# Patient Record
Sex: Female | Born: 1950
Health system: Southern US, Community
[De-identification: ages and names within clinical notes are randomized; demographics above are authoritative.]

## PROBLEM LIST (undated history)

## (undated) DIAGNOSIS — G4730 Sleep apnea, unspecified: Secondary | ICD-10-CM

## (undated) DIAGNOSIS — I10 Essential (primary) hypertension: Secondary | ICD-10-CM

## (undated) DIAGNOSIS — I2510 Atherosclerotic heart disease of native coronary artery without angina pectoris: Secondary | ICD-10-CM

## (undated) DIAGNOSIS — C4491 Basal cell carcinoma of skin, unspecified: Secondary | ICD-10-CM

## (undated) DIAGNOSIS — C4492 Squamous cell carcinoma of skin, unspecified: Secondary | ICD-10-CM

## (undated) DIAGNOSIS — N200 Calculus of kidney: Secondary | ICD-10-CM

## (undated) DIAGNOSIS — J189 Pneumonia, unspecified organism: Secondary | ICD-10-CM

## (undated) DIAGNOSIS — F419 Anxiety disorder, unspecified: Secondary | ICD-10-CM

## (undated) DIAGNOSIS — M1990 Unspecified osteoarthritis, unspecified site: Secondary | ICD-10-CM

## (undated) DIAGNOSIS — I519 Heart disease, unspecified: Secondary | ICD-10-CM

## (undated) DIAGNOSIS — E7800 Pure hypercholesterolemia, unspecified: Secondary | ICD-10-CM

## (undated) DIAGNOSIS — Z9889 Other specified postprocedural states: Secondary | ICD-10-CM

## (undated) DIAGNOSIS — E039 Hypothyroidism, unspecified: Secondary | ICD-10-CM

## (undated) DIAGNOSIS — K219 Gastro-esophageal reflux disease without esophagitis: Secondary | ICD-10-CM

## (undated) DIAGNOSIS — R112 Nausea with vomiting, unspecified: Secondary | ICD-10-CM

## (undated) HISTORY — PX: EYE SURGERY: SHX253

## (undated) HISTORY — PX: THYROIDECTOMY, PARTIAL: SHX18

## (undated) HISTORY — PX: JOINT REPLACEMENT: SHX530

## (undated) HISTORY — PX: CATARACT EXTRACTION: SUR2

## (undated) HISTORY — DX: Heart disease, unspecified: I51.9

## (undated) HISTORY — DX: Basal cell carcinoma of skin, unspecified: C44.91

## (undated) HISTORY — DX: Squamous cell carcinoma of skin, unspecified: C44.92

## (undated) HISTORY — DX: Unspecified osteoarthritis, unspecified site: M19.90

---

## 1978-01-27 HISTORY — PX: TUBAL LIGATION: SHX77

## 1978-01-27 HISTORY — PX: UMBILICAL HERNIA REPAIR: SHX196

## 1991-01-28 HISTORY — PX: OTHER SURGICAL HISTORY: SHX169

## 1997-03-07 ENCOUNTER — Ambulatory Visit (HOSPITAL_COMMUNITY): Admission: RE | Admit: 1997-03-07 | Discharge: 1997-03-07 | Payer: Self-pay | Admitting: *Deleted

## 1998-04-16 ENCOUNTER — Other Ambulatory Visit: Admission: RE | Admit: 1998-04-16 | Discharge: 1998-04-16 | Payer: Self-pay | Admitting: Obstetrics and Gynecology

## 1998-05-15 ENCOUNTER — Ambulatory Visit (HOSPITAL_COMMUNITY): Admission: RE | Admit: 1998-05-15 | Discharge: 1998-05-15 | Payer: Self-pay | Admitting: Obstetrics and Gynecology

## 1998-05-15 ENCOUNTER — Encounter: Payer: Self-pay | Admitting: Obstetrics and Gynecology

## 1999-05-28 ENCOUNTER — Encounter: Payer: Self-pay | Admitting: Obstetrics and Gynecology

## 1999-05-28 ENCOUNTER — Ambulatory Visit (HOSPITAL_COMMUNITY): Admission: RE | Admit: 1999-05-28 | Discharge: 1999-05-28 | Payer: Self-pay | Admitting: Obstetrics and Gynecology

## 1999-09-26 ENCOUNTER — Other Ambulatory Visit: Admission: RE | Admit: 1999-09-26 | Discharge: 1999-09-26 | Payer: Self-pay | Admitting: Obstetrics and Gynecology

## 2000-08-04 ENCOUNTER — Ambulatory Visit (HOSPITAL_COMMUNITY): Admission: RE | Admit: 2000-08-04 | Discharge: 2000-08-04 | Payer: Self-pay | Admitting: Obstetrics and Gynecology

## 2000-08-04 ENCOUNTER — Encounter: Payer: Self-pay | Admitting: Obstetrics and Gynecology

## 2000-10-22 ENCOUNTER — Other Ambulatory Visit: Admission: RE | Admit: 2000-10-22 | Discharge: 2000-10-22 | Payer: Self-pay | Admitting: Obstetrics and Gynecology

## 2001-10-21 ENCOUNTER — Other Ambulatory Visit: Admission: RE | Admit: 2001-10-21 | Discharge: 2001-10-21 | Payer: Self-pay | Admitting: Obstetrics and Gynecology

## 2001-11-27 HISTORY — PX: HYSTEROSCOPY: SHX211

## 2001-12-14 ENCOUNTER — Ambulatory Visit (HOSPITAL_COMMUNITY): Admission: RE | Admit: 2001-12-14 | Discharge: 2001-12-14 | Payer: Self-pay | Admitting: Obstetrics and Gynecology

## 2001-12-14 ENCOUNTER — Encounter (INDEPENDENT_AMBULATORY_CARE_PROVIDER_SITE_OTHER): Payer: Self-pay | Admitting: *Deleted

## 2001-12-28 ENCOUNTER — Encounter: Payer: Self-pay | Admitting: Obstetrics and Gynecology

## 2001-12-28 ENCOUNTER — Ambulatory Visit (HOSPITAL_COMMUNITY): Admission: RE | Admit: 2001-12-28 | Discharge: 2001-12-28 | Payer: Self-pay | Admitting: Obstetrics and Gynecology

## 2002-10-24 ENCOUNTER — Other Ambulatory Visit: Admission: RE | Admit: 2002-10-24 | Discharge: 2002-10-24 | Payer: Self-pay | Admitting: Obstetrics and Gynecology

## 2002-10-28 HISTORY — PX: THYROID CYST EXCISION: SHX2511

## 2003-01-13 ENCOUNTER — Ambulatory Visit (HOSPITAL_COMMUNITY): Admission: RE | Admit: 2003-01-13 | Discharge: 2003-01-13 | Payer: Self-pay | Admitting: Obstetrics and Gynecology

## 2003-01-28 DIAGNOSIS — I2510 Atherosclerotic heart disease of native coronary artery without angina pectoris: Secondary | ICD-10-CM

## 2003-01-28 HISTORY — DX: Atherosclerotic heart disease of native coronary artery without angina pectoris: I25.10

## 2003-01-28 HISTORY — PX: ANGIOPLASTY: SHX39

## 2003-01-28 HISTORY — PX: CORONARY ANGIOPLASTY: SHX604

## 2003-07-12 ENCOUNTER — Other Ambulatory Visit: Payer: Self-pay

## 2003-11-27 ENCOUNTER — Other Ambulatory Visit: Admission: RE | Admit: 2003-11-27 | Discharge: 2003-11-27 | Payer: Self-pay | Admitting: Obstetrics and Gynecology

## 2003-12-29 ENCOUNTER — Ambulatory Visit (HOSPITAL_COMMUNITY): Admission: RE | Admit: 2003-12-29 | Discharge: 2003-12-30 | Payer: Self-pay | Admitting: Cardiovascular Disease

## 2004-01-15 ENCOUNTER — Ambulatory Visit (HOSPITAL_COMMUNITY): Admission: RE | Admit: 2004-01-15 | Discharge: 2004-01-15 | Payer: Self-pay | Admitting: Obstetrics and Gynecology

## 2004-01-16 ENCOUNTER — Encounter: Payer: Self-pay | Admitting: Cardiovascular Disease

## 2004-01-28 ENCOUNTER — Encounter: Payer: Self-pay | Admitting: Cardiovascular Disease

## 2004-02-28 ENCOUNTER — Encounter: Payer: Self-pay | Admitting: Cardiovascular Disease

## 2004-03-27 ENCOUNTER — Encounter: Payer: Self-pay | Admitting: Cardiovascular Disease

## 2004-04-27 ENCOUNTER — Encounter: Payer: Self-pay | Admitting: Cardiovascular Disease

## 2004-07-29 ENCOUNTER — Encounter: Payer: Self-pay | Admitting: Cardiovascular Disease

## 2004-12-12 ENCOUNTER — Other Ambulatory Visit: Admission: RE | Admit: 2004-12-12 | Discharge: 2004-12-12 | Payer: Self-pay | Admitting: Obstetrics and Gynecology

## 2005-01-13 ENCOUNTER — Ambulatory Visit (HOSPITAL_COMMUNITY): Admission: RE | Admit: 2005-01-13 | Discharge: 2005-01-13 | Payer: Self-pay | Admitting: Gastroenterology

## 2005-01-15 ENCOUNTER — Ambulatory Visit (HOSPITAL_COMMUNITY): Admission: RE | Admit: 2005-01-15 | Discharge: 2005-01-15 | Payer: Self-pay | Admitting: Obstetrics and Gynecology

## 2005-12-17 ENCOUNTER — Other Ambulatory Visit: Admission: RE | Admit: 2005-12-17 | Discharge: 2005-12-17 | Payer: Self-pay | Admitting: Obstetrics & Gynecology

## 2006-01-01 DIAGNOSIS — I2510 Atherosclerotic heart disease of native coronary artery without angina pectoris: Secondary | ICD-10-CM | POA: Insufficient documentation

## 2006-01-01 DIAGNOSIS — E039 Hypothyroidism, unspecified: Secondary | ICD-10-CM | POA: Insufficient documentation

## 2006-01-16 ENCOUNTER — Ambulatory Visit (HOSPITAL_COMMUNITY): Admission: RE | Admit: 2006-01-16 | Discharge: 2006-01-16 | Payer: Self-pay | Admitting: Obstetrics & Gynecology

## 2006-01-22 ENCOUNTER — Ambulatory Visit (HOSPITAL_COMMUNITY): Admission: RE | Admit: 2006-01-22 | Discharge: 2006-01-22 | Payer: Self-pay | Admitting: Obstetrics & Gynecology

## 2007-06-11 ENCOUNTER — Other Ambulatory Visit: Admission: RE | Admit: 2007-06-11 | Discharge: 2007-06-11 | Payer: Self-pay | Admitting: Obstetrics and Gynecology

## 2007-06-11 ENCOUNTER — Ambulatory Visit (HOSPITAL_COMMUNITY): Admission: RE | Admit: 2007-06-11 | Discharge: 2007-06-11 | Payer: Self-pay | Admitting: Obstetrics & Gynecology

## 2008-02-28 HISTORY — PX: CARDIAC CATHETERIZATION: SHX172

## 2008-03-24 ENCOUNTER — Ambulatory Visit: Payer: Self-pay | Admitting: Cardiovascular Disease

## 2008-10-13 ENCOUNTER — Ambulatory Visit (HOSPITAL_COMMUNITY): Admission: RE | Admit: 2008-10-13 | Discharge: 2008-10-13 | Payer: Self-pay | Admitting: Obstetrics & Gynecology

## 2009-12-14 ENCOUNTER — Ambulatory Visit (HOSPITAL_COMMUNITY): Admission: RE | Admit: 2009-12-14 | Discharge: 2009-12-14 | Payer: Self-pay | Admitting: Obstetrics & Gynecology

## 2010-02-17 ENCOUNTER — Encounter: Payer: Self-pay | Admitting: Obstetrics & Gynecology

## 2010-06-14 NOTE — Op Note (Signed)
NAME:  Olivia Dougherty, Olivia Dougherty                           ACCOUNT NO.:  [id number]   MEDICAL RECORD NO.:  [id number]                   PATIENT TYPE:  AMB   LOCATION:  SDC                                  FACILITY:  [facility]   PHYSICIAN:  Laqueta Linden, M.D.                 DATE OF BIRTH:  October 11, 1950   DATE OF PROCEDURE:  12/14/2001  DATE OF DISCHARGE:                                 OPERATIVE REPORT   PREOPERATIVE DIAGNOSIS:  Dysfunctional uterine bleeding due to submucosal  fibroids.   POSTOPERATIVE DIAGNOSIS:  Dysfunctional uterine bleeding due to submucosal  fibroids.   PROCEDURE:  Hysteroscopic resection with curettage.   SURGEON:  Laqueta Linden, M.D.   ANESTHESIA:  General LMA with paracervical block.   ESTIMATED BLOOD LOSS:  Less than 20 cc.   FLUIDS:  Sorbitol:  Less than 10 cc.   COMPLICATIONS:  None.   INDICATIONS:  The patient is a 60 year old perimenopausal female with  worsening dysmenorrhea, menorrhagia, and prolonged bleeding.  She underwent  pelvic ultrasound with sonohysterogram, which revealed a slightly enlarged  uterus with a thickened endometrial stripe and an intramural fibroid.  Sonohysterogram revealed an echogenic focus on the posterior wall measuring  7 x 5 mm consistent with a submucosal fibroid.  It was felt that the  patient's age was contributing to her heavy flow, and she was started on low-  dose birth control pills.  This did regulate her cycles somewhat, but they  continued to be profusely heavy with clots felt to be related to the  submucosal fibroids.  For this reason, she is to undergo hysteroscopic  resection.  She has seen the informed consent film, voiced her understanding  and acceptance of risks, benefits, alternatives, and complications,  including the need for ongoing birth control pills due to her perimenopausal  state, and agrees to proceed.  Full consent has been given.   DESCRIPTION OF PROCEDURE:  The patient was taken to the operating room  and  after proper identification and consent were ascertained, she was placed on  the operating table in supine position.  After IV sedation was accomplished,  she was placed in the [place] stirrups and the abdomen, perineum, and vagina  were prepped and draped in the routine sterile fashion.  A transurethral  Foley was placed, which was removed at the conclusion of the procedure.  The  patient did get somewhat agitated during this preparatory time and requested  to go to sleep.  General LMA was then induced.  At this point the speculum  was placed and a paracervical block utilizing 10 cc of 1% plain Xylocaine  was then placed circumferentially.  The uterine cavity sounded to 9 cm.  The  internal os was gently dilated to a #33 Pratt dilator.  The resectoscope  with double loop and continuous visualization with video was then inserted.  There were no endocervical  lesions.  Both tubal ostia were visualized.  There was diffuse irregularity on both the anterior and posterior walls of  the uterus.  There were two submucosal-appearing fibroids along the left  posterior wall of the uterus.  There was also an anterior lesion that  appeared to be a fibroid or a polyp.  The resectoscope was placed on routine  settings, and all of the fibroid or polypoid tissue was resected with  excellent hemostasis noted.  This was sent separately to pathology.  All  tissue pieces were evacuated.  At this point sharp curettage was performed,  productive of a moderate amount of additional tissue.  This was sent as a  separate specimen.  All instruments were then removed.  The tenaculum site  was noted to be hemostatic.  Of note, there was a slight amount of uterine  descent noted with traction on the tenaculum, with the cervix descending no  further than the spines.  The tenaculum site was hemostatic.  There was no  active bleeding from the cervix.  Net sorbitol intake was less than 100 cc.  The Foley was removed.  The  patient was awakened and extubated and stable on  transfer to the PACU.  She will be observed and discharged per anesthesia  protocol.  She was given 30 mg of Toradol IV and 30 mg IM prior to the  conclusion of the procedure.  She will be discharged home per protocol with  routine instructions.  She is to call the office for excessive pain, fever,  bleeding, or other concerns.  She will be contacted regarding pathology.  She is to continue her birth control pills and take Advil or Aleve as needed  for her cramps.  She was given routine verbal and written discharge  instructions.                                                Laqueta Linden, M.D.    LKS/MEDQ  D:  12/14/2001  T:  12/14/2001  Job:  [id number]

## 2010-06-14 NOTE — Op Note (Signed)
Olivia Dougherty, APT                 ACCOUNT NO.:  [id number]   MEDICAL RECORD NO.:  [id number]          PATIENT TYPE:  AMB   LOCATION:  ENDO                         FACILITY:  [facility]   PHYSICIAN:  Anselmo Rod, M.D.  DATE OF BIRTH:  1950/02/20   DATE OF PROCEDURE:  01/13/2005  DATE OF DISCHARGE:  01/13/2005                                 OPERATIVE REPORT   PROCEDURE PERFORMED:  Screening colonoscopy.   ENDOSCOPIST:  Anselmo Rod, M.D.   INSTRUMENT USED:  Olympus video colonoscope.   INDICATIONS FOR THE PROCEDURE:  This is a 60 year old white female who is  undergoing screening colonoscopy to rule out colonic polyps masses, etc.   PREPROCEDURE PREPARATION:  Informed consent was procured from the patient.  The patient fasted for eight hours prior to the procedure and prepped with a  bottle of GoLYTELY the night prior to the procedure.  The risks and benefits  of the procedure including a 10% missed rate of cancer and polyp were  discussed with the patient as well.   PREPROCEDURE PHYSICAL EXAMINATION:  VITAL SIGNS:  The patient has stable  vital signs.  NECK:  The neck is supple.  CHEST:  The chest is clear to auscultation.  HEART:  S1 and S2 are regular.  ABDOMEN:  The abdomen is soft with normal bowel sounds.   DESCRIPTION OF THE PROCEDURE:  The patient was placed in the left lateral  decubitus position.  She was sedated with a hundred micrograms fentanyl and  10 mg of Versed in slow incremental doses.  Once the patient was adequately  sedated, maintained on low flow oxygen and continuous cardiac monitoring the  Olympus video colonoscope was advanced  from the rectum to the cecum with  difficulty.  There was a significant amount of residual stool in the colon  and multiple washes were done.  There were scattered diverticula seen in the  sigmoid colon.  Prominent anal papilla was seen on retroflexion.  The  appendiceal orifice and ileocecal valve were visualized and were  without any  obvious lesions or abnormalities.   IMPRESSION:  1.Prominent anal papilla seen on retroflexion.  2.Early sigmoid diverticulosis noted.  3.Large amount of residual stool in the colon; small lesion could have been  missed.  4.Normal terminal ileum.  5.No masses or polyps seen.   RECOMMENDATIONS:  1.Continue a high-fiber diet wit liberal fluid intake.  2.Repeat colonoscopy in the next 10 years unless th has any abnormal  symptoms.  3.Resume aspirin and Plavix today,  4.Outpatient follow up if the need arises in the future.      Anselmo Rod, M.D.  Electronically Signed     JNM/MEDQ  D:  01/13/2005  T:  01/15/2005  Job:  [id number]   cc:   Edwena Felty. Romine, M.D.  Fax: [phone]   Yves Dill  Fax: [phone]   Richard A. Alanda Amass, M.D.  Fax: [phone]

## 2010-06-14 NOTE — Cardiovascular Report (Signed)
Olivia Dougherty, Olivia Dougherty                 ACCOUNT NO.:  [id number]   MEDICAL RECORD NO.:  [id number]          PATIENT TYPE:  OIB   LOCATION:  6522                         FACILITY:  [facility]   PHYSICIAN:  Richard A. Alanda Amass, M.D.DATE OF BIRTH:  [birth year]   DATE OF PROCEDURE:  12/29/2003  DATE OF DISCHARGE:                              CARDIAC CATHETERIZATION   PROCEDURE:  Retrograde central aortic catheterization, selective left  coronary angiography pre and post intracoronary nitroglycerin  administration, weight-adjusted heparin 4300 units, Aggrastat double bolus  plus infusion, additional Plavix 150 mg, intracoronary nitroglycerin, direct  coronary stenting high grade proximal LAD stenosis post large DX1, DX1  ostial plaque shift lesion post stent deployment treated through strut with  PTA.   BRIEF HISTORY:  Mrs. Opfer is a 60 year old married mother of three boys  with expecting her sixth grandchild.  She is a nonsmoker, has a family  history of coronary disease and history of hypertension, hyperlipidemia.  She is retired.  She raised her children, worked almost 15  years at [employer] and is currently retired.  She has recent onset of exertional and rest  chest discomfort that has been somewhat atypical.  Last summer she had a  negative Cardiolite for ischemia and had empiric courses of medical therapy  with PPI without relief.  Because of continued symptoms, she underwent  diagnostic catheterization by Dr. [name] at [facility] on  December 28, 2003.  She was found to have a high grade concentric LAD lesion  of approximately 70-80% angiographically beginning just at the origin of a  large first diagonal branch (twin diagonal).  The LAD did course to the apex  and undersurface of the heart and there was essentially normal nondominant  circumflex and dominant right coronary with normal left ventricular  function.  I reviewed the angiograms with Dr. [name].  We felt that this  patient was a candidate for PCI.  It was felt that IVUS interrogation might  be helpful to further assess the lesion and the proximity to the diagonal  and for planning of strategy for intervention.  The patient was started on  Plavix as an outpatient as well as Crestor.  Continued on aspirin and beta  blocker, ARB, and Zetia and supplemental thyroid.  Informed consent was  obtained from the patient and her husband to proceed with procedure.  Preoperative laboratories showed normal BUN, creatinine of 14/0.7 and normal  CBC and diff and coags.   She was brought to the second floor CP lab in the postabsorptive state after  5 mg of Valium p.o. premedication of the same day admission.  She was given  another 150 mg of Plavix in the lab and we planned to give her an additional  450 mg of Plavix today post procedure.  The right groin was prepped, draped  in the usual manner.  1% Xylocaine was used for local anesthesia.  She was  given intermittent Versed 2 mg and Nubain 2 mg for sedation in the  laboratory.  The CRFA was entered with single anterior puncture using 18  thin-wall  needle and a 6 French short Daig side arm sheath was inserted  without difficulty.  Left coronary angiography was done with a Scimed 6  [place] JL-4 guiding catheter.  This demonstrated high grade ostial stenosis  of the LAD just beyond the large DX1 unchanged from prior angiogram.  Intracoronary nitroglycerin was administered.  The patient was given weight-  adjusted heparin at 4300 units, started on Aggrastat bolus plus infusion,  monitoring ACTs.  Once therapeutic, a 0.014 inch Asahi soft guide wire was  used across the stenosis and was free in the distal LAD.  IVUS interrogation  was then done with a 4  French IVUS Atlantis Pro catheter with pullbacks  through the LAD.  The mid LAD reference diameter was approximately 3.1 mm.  The proximal LAD reference diameter was 3.4-3.5 mm.  The lesion beyond the  diagonal measured  1.9 x 1.8 mm, was eccentric and predominantly soft plaque.  It ended just at the origin of the large diagonal branch.   The IVUS was removed.  It was felt best because of the take off angle of the  diagonal that in order to cover the LAD lesion this would not be entirely  covered without sticking out into the lumen and this LAD had to be treated  as an ostial type lesion.  Therefore, the stent was positioned across the  large diagonal ostia fluoroscopically just proximal to it and deployed at 12-  17, post dilated at 14-22.  The patient had substernal pressure that was  exactly reproduced her historical chest discomfort along with substernal  burning.   Pain was relieved with deflation and using exchange technique the stent was  post dilated with a 3.5/8 balloon in the proximal portion to match the  proximal LAD size.  This was done at 12-37.  The balloon was removed and  injection showed plaque shift and pinching of the ostium of the first  diagonal to about 90% stenosis.  The wire was kept in the LAD and the  diagonal lesion was then crossed with a 0.014 inch second Asahi soft wire.  The wire was passed through the stent struts and was free in the large  diagonal branch and the ostium of the diagonal was crossed with a 3.0/8  Quantum Maverick balloon.  Inflations were done at 10-12, 5-35 and 5-26.  The balloon was pulled back and final injections were done in multiple  orthogonal projections.  This demonstrated excellent apposition of the LAD  stent across the diagonal, widely patent LAD with approximately 0% residual  stenosis.  There was approximately 20% residual narrowing of the ostium of  the diagonal, but TIMI-3 flow throughout the vessel.  There was no  dissection or thrombus visualized.  The dilatation system was removed.  The  final ACT was 299 seconds.  The patient was transferred to the holding area for sheath removal and pressure hemostasis when the ACT comes down.  We  plan  to continue 2b3a inhibitor Aggrastat for 18 hours along with further loading  of Plavix with 350 mg, then 75 daily plus aspirin.  His annual ancillary  medicines as outlined above.   The patient will be followed by her primary cardiologist Dr. [name] in  followup.   CATHETERIZATION DIAGNOSES:  1.  Chronic chest pain compatible with angina and high grade proximal left      anterior descending stenosis beyond diagonal one demonstrated at      diagnostic catheterization December 28, 2003 by Dr. [name].  2.  Normal left ventricular function, normal circumflex and right coronary      artery.  3.  Systemic hypertension on medical therapy.  4.  Hyperlipidemia on therapy.  5.  Gastroesophageal reflux disease on therapy.  6.  Hypothyroidism on therapy.  7.  Nonsmoker.  8.  Mild exogenous obesity.      Rich   RAW/MEDQ  D:  12/29/2003  T:  12/29/2003  Job:  [id number]   cc:   Dr. Adrian Blackwater

## 2010-06-14 NOTE — Discharge Summary (Signed)
Olivia Dougherty, Olivia Dougherty                 ACCOUNT NO.:  [id number]   MEDICAL RECORD NO.:  [id number]          PATIENT TYPE:  OIB   LOCATION:  6522                         FACILITY:  [facility]   PHYSICIAN:  Richard A. Alanda Amass, M.D.DATE OF BIRTH:  Feb 26, 1950   DATE OF ADMISSION:  12/29/2003  DATE OF DISCHARGE:  12/30/2003                                 DISCHARGE SUMMARY   DISCHARGE DIAGNOSES:  1.  Coronary disease, status post elective left anterior descending artery      CYPHER stenting this admission.  2.  Treated hypertension.  3.  Treated hyperlipidemia.  4.  Treated hypothyroidism.   HOSPITAL COURSE:  The patient is a 60 year old female followed by Dr. [name]  who had been having some chest pain since the summer.  She had a negative  Cardiolite study, but continued to have symptoms.  She underwent diagnostic  catheterization December 27, 2003.  [facility]  The  patient is admitted now for an elective LAD intervention.  The remainder of  her coronaries were normal and she has normal LV function.  Procedure was  done December 29, 2003 and Dr. Alanda Amass reduced her stenosis from 85-40%.  She did have a vagal episode post procedure that responded to IV fluids.  She was transferred to the floor and ambulated.  We feel she can be  discharged December 30, 2003.   DISCHARGE MEDICATIONS:  1.  Plavix 75 mg daily.  2.  Aspirin 81 mg daily.  3.  Levoxyl 75 mg daily.  4.  Nexium 40 mg daily.  5.  Diovan 80 mg daily.  6.  Toprol XL 50 mg daily.  7.  Isosorbide mononitrate 30 mg daily.  8.  Crestor 20 mg daily.  9.  Zetia 10 mg daily.  10. Nitroglycerin sublingual p.r.n.   LABS:  Sodium 138, potassium 3.2, BUN 8, creatinine 0.7, white count 7.4,  hemoglobin 11.6, hematocrit 33.5, platelets 218, EKG shows sinus rhythm  without acute changes.   DISPOSITION:  The patient is discharged in stable condition and will follow  up with Dr. [name] in [place] in a couple of  weeks.      Olivia Dougherty  D:  12/30/2003  T:  12/31/2003  Job:  [id number]   cc:   Adrian Blackwater, MD  [facility]  [address]  [place]  [place] [address]

## 2010-09-12 ENCOUNTER — Other Ambulatory Visit: Payer: Self-pay | Admitting: Obstetrics & Gynecology

## 2010-09-12 DIAGNOSIS — Z1231 Encounter for screening mammogram for malignant neoplasm of breast: Secondary | ICD-10-CM

## 2010-12-16 ENCOUNTER — Ambulatory Visit (HOSPITAL_COMMUNITY)
Admission: RE | Admit: 2010-12-16 | Discharge: 2010-12-16 | Disposition: A | Discharge status: Home / Self Care | Payer: BC Managed Care – PPO | Source: Ambulatory Visit | Attending: Obstetrics & Gynecology | Admitting: Obstetrics & Gynecology

## 2010-12-16 DIAGNOSIS — Z1231 Encounter for screening mammogram for malignant neoplasm of breast: Secondary | ICD-10-CM

## 2011-07-29 DIAGNOSIS — M1990 Unspecified osteoarthritis, unspecified site: Secondary | ICD-10-CM | POA: Insufficient documentation

## 2011-07-29 DIAGNOSIS — R0602 Shortness of breath: Secondary | ICD-10-CM | POA: Insufficient documentation

## 2011-11-13 ENCOUNTER — Other Ambulatory Visit: Payer: Self-pay | Admitting: Obstetrics & Gynecology

## 2011-11-13 DIAGNOSIS — Z1231 Encounter for screening mammogram for malignant neoplasm of breast: Secondary | ICD-10-CM

## 2011-11-28 DIAGNOSIS — C4491 Basal cell carcinoma of skin, unspecified: Secondary | ICD-10-CM

## 2011-11-28 HISTORY — DX: Basal cell carcinoma of skin, unspecified: C44.91

## 2011-12-18 ENCOUNTER — Ambulatory Visit (HOSPITAL_COMMUNITY)
Admission: RE | Admit: 2011-12-18 | Discharge: 2011-12-18 | Disposition: A | Discharge status: Home / Self Care | Payer: BC Managed Care – PPO | Source: Ambulatory Visit | Attending: Obstetrics & Gynecology | Admitting: Obstetrics & Gynecology

## 2011-12-18 DIAGNOSIS — Z1231 Encounter for screening mammogram for malignant neoplasm of breast: Secondary | ICD-10-CM

## 2011-12-23 ENCOUNTER — Other Ambulatory Visit: Payer: Self-pay | Admitting: Obstetrics & Gynecology

## 2011-12-23 DIAGNOSIS — R928 Other abnormal and inconclusive findings on diagnostic imaging of breast: Secondary | ICD-10-CM

## 2011-12-31 ENCOUNTER — Ambulatory Visit
Admission: RE | Admit: 2011-12-31 | Discharge: 2011-12-31 | Disposition: A | Discharge status: Home / Self Care | Payer: BC Managed Care – PPO | Source: Ambulatory Visit | Attending: Obstetrics & Gynecology | Admitting: Obstetrics & Gynecology

## 2011-12-31 DIAGNOSIS — R928 Other abnormal and inconclusive findings on diagnostic imaging of breast: Secondary | ICD-10-CM

## 2012-11-04 ENCOUNTER — Other Ambulatory Visit: Payer: Self-pay | Admitting: Obstetrics & Gynecology

## 2012-11-04 DIAGNOSIS — Z1231 Encounter for screening mammogram for malignant neoplasm of breast: Secondary | ICD-10-CM

## 2012-12-14 ENCOUNTER — Encounter: Payer: Self-pay | Admitting: Obstetrics & Gynecology

## 2012-12-15 ENCOUNTER — Encounter: Payer: Self-pay | Admitting: Obstetrics & Gynecology

## 2012-12-16 ENCOUNTER — Encounter: Payer: Self-pay | Admitting: Obstetrics & Gynecology

## 2012-12-16 ENCOUNTER — Ambulatory Visit (INDEPENDENT_AMBULATORY_CARE_PROVIDER_SITE_OTHER): Payer: BC Managed Care – PPO | Admitting: Obstetrics & Gynecology

## 2012-12-16 VITALS — BP 140/80 | HR 72 | Resp 25 | Ht 62.5 in | Wt 221.6 lb

## 2012-12-16 DIAGNOSIS — Z0000 Encounter for general adult medical examination without abnormal findings: Secondary | ICD-10-CM

## 2012-12-16 DIAGNOSIS — Z01419 Encounter for gynecological examination (general) (routine) without abnormal findings: Secondary | ICD-10-CM

## 2012-12-16 DIAGNOSIS — Z1239 Encounter for other screening for malignant neoplasm of breast: Secondary | ICD-10-CM

## 2012-12-16 LAB — POCT URINALYSIS DIPSTICK
Bilirubin, UA: NEGATIVE
Blood, UA: NEGATIVE
Glucose, UA: NEGATIVE
Ketones, UA: NEGATIVE
Nitrite, UA: NEGATIVE
Protein, UA: NEGATIVE
Urobilinogen, UA: NEGATIVE
pH, UA: 6.5

## 2012-12-16 LAB — HEMOGLOBIN, FINGERSTICK: Hemoglobin, fingerstick: 14.4 g/dL (ref 12.0–16.0)

## 2012-12-16 NOTE — Progress Notes (Signed)
62 y.o. G3P3 MarriedCaucasianF here for annual exam.  No vaginal bleeding.  Doing really well.  Sees cardiologist ever six months.  Had echo and EKG in April.  Everything was good.  Last appt was 12/07/12.  The biggest issue for pt is weight gain and increased BP.  She retired and she knows she is eating more than she did before she retired.  Has gotten flu shot and is going to get pneumovax but the office didn't have it that day.    Patient's last menstrual period was 01/28/2007.          Sexually active: yes  The current method of family planning is tubal ligation.    Exercising: no  not regularly Smoker:  no  Health Maintenance: Pap:  12/18/11 WNL/negative HR HPV History of abnormal Pap:  no MMG:  12/18/11 MMG, 12/31/11 right diag-normal, scheduled 11/24. Colonoscopy:  2006 repeat in 10 years BMD:   4/11 normal TDaP:  10/12 Screening Labs: with cardiologist, Hb today: 14.4, Urine today: WBC-1+, PH-6.5   reports that she has never smoked. She has never used smokeless tobacco. She reports that she drinks about 2.0 ounces of alcohol per week. She reports that she does not use illicit drugs.  Past Medical History  Diagnosis Date  . Perimenopausal menorrhagia   . Basal cell carcinoma 11/2011    face, left leg-using chemo cream on temple area  . Heart problem     98% blockage    Past Surgical History  Procedure Laterality Date  . Tubal ligation  1980    BTL  . Umbilical hernia repair  1980  . Vulvar cancer  1993    laser  . Hysteroscopy  11/2001    resection of benign leiomyoma  . Cardiac catheterization  02/2008  . Angioplasty  2005    CAD   98% blockage  . Thyroid cyst excision Left 10/2002    left lobe thyroid excision    Current Outpatient Prescriptions  Medication Sig Dispense Refill  . aspirin 81 MG tablet Take 81 mg by mouth daily.      Marland Kitchen atorvastatin (LIPITOR) 40 MG tablet 40 mg daily.      . cholecalciferol (VITAMIN D) 1000 UNITS tablet Take 1,000 Units by mouth  daily.      . clopidogrel (PLAVIX) 75 MG tablet Take 75 mg by mouth daily with breakfast.      . levothyroxine (SYNTHROID, LEVOTHROID) 75 MCG tablet Take 75 mcg by mouth daily before breakfast.      . losartan (COZAAR) 50 MG tablet 50 mg daily.      . metoprolol succinate (TOPROL-XL) 50 MG 24 hr tablet Take 50 mg by mouth daily. Take with or immediately following a meal.      . Multiple Vitamins-Minerals (MULTIVITAMIN PO) Take by mouth daily.      . Omega-3 Fatty Acids (FISH OIL PO) Take 1,000 mg by mouth. Two daily      . ranitidine (ZANTAC) 150 MG capsule        No current facility-administered medications for this visit.    Family History  Problem Relation Age of Onset  . Hypertension Mother   . Hypertension Father   . Diabetes Father   . Heart disease Father     ROS:  Pertinent items are noted in HPI.  Otherwise, a comprehensive ROS was negative.  Exam:   BP 140/80  Pulse 72  Resp 25  Ht 5' 2.5" (1.588 m)  Wt 221 lb 9.6  oz (100.517 kg)  BMI 39.86 kg/m2  LMP 01/28/2007  +16lb  Height: 5' 2.5" (158.8 cm)  Ht Readings from Last 3 Encounters:  12/16/12 5' 2.5" (1.588 m)    General appearance: alert, cooperative and appears stated age Head: Normocephalic, without obvious abnormality, atraumatic Neck: no adenopathy, supple, symmetrical, trachea midline and thyroid normal to inspection and palpation Lungs: clear to auscultation bilaterally Breasts: normal appearance, no masses or tenderness Heart: regular rate and rhythm Abdomen: soft, non-tender; bowel sounds normal; no masses,  no organomegaly Extremities: extremities normal, atraumatic, no cyanosis or edema Skin: Skin color, texture, turgor normal. No rashes or lesions Lymph nodes: Cervical, supraclavicular, and axillary nodes normal. No abnormal inguinal nodes palpated Neurologic: Grossly normal   Pelvic: External genitalia:  no lesions              Urethra:  normal appearing urethra with no masses, tenderness or  lesions              Bartholins and Skenes: normal                 Vagina: normal appearing vagina with normal color and discharge, no lesions              Cervix: no lesions              Pap taken: no Bimanual Exam:  Uterus:  normal size, contour, position, consistency, mobility, non-tender              Adnexa: normal adnexa and no mass, fullness, tenderness               Rectovaginal: Confirms               Anus:  normal sphincter tone, no lesions  A:  Well Woman with normal exam PMP, No HRT H/O CAD, cath 2/10, normal echo 4/14, see cardiology every 6 months H/O BCC/SCC Weight gain  P:   Mammogram yearly.  Abnormal 1 year ago.  3D to be scheduled for pt. Labs with cardiology pap smear with neg HR HPV 2013.  No pap today. return annually or prn  An After Visit Summary was printed and given to the patient.

## 2012-12-16 NOTE — Patient Instructions (Signed)

## 2012-12-20 ENCOUNTER — Ambulatory Visit (HOSPITAL_COMMUNITY)
Admission: RE | Admit: 2012-12-20 | Discharge: 2012-12-20 | Disposition: A | Discharge status: Home / Self Care | Payer: BC Managed Care – PPO | Source: Ambulatory Visit | Attending: Obstetrics & Gynecology | Admitting: Obstetrics & Gynecology

## 2012-12-20 DIAGNOSIS — Z1231 Encounter for screening mammogram for malignant neoplasm of breast: Secondary | ICD-10-CM

## 2013-03-30 ENCOUNTER — Ambulatory Visit: Payer: Self-pay | Admitting: Obstetrics & Gynecology

## 2013-04-01 ENCOUNTER — Ambulatory Visit: Payer: Self-pay | Admitting: Obstetrics & Gynecology

## 2013-11-28 ENCOUNTER — Encounter: Payer: Self-pay | Admitting: Obstetrics & Gynecology

## 2013-12-16 ENCOUNTER — Ambulatory Visit: Payer: Self-pay | Admitting: Obstetrics & Gynecology

## 2013-12-26 ENCOUNTER — Telehealth: Payer: Self-pay | Admitting: Obstetrics & Gynecology

## 2013-12-26 NOTE — Telephone Encounter (Signed)
Patient returned call. Advised scheduled for 3d Mammogram at [facility]. Order was placed for 3D. Advised patient to confirm at check in as well.  She is agreeable.  Routing to provider for final review. Patient agreeable to disposition. Will close encounter

## 2013-12-26 NOTE — Telephone Encounter (Signed)
Patient scheduled for 3D Tomo bilateral breast at [facility].  Will advise patient scheduled for 3D.  Call to patient, has stepped out.  Per female who answered, he will advise patient to return my call.

## 2013-12-26 NOTE — Telephone Encounter (Signed)
Pt is scheduled for a mammogram on 01/13/14 and wants to know if it is a 3D mammogram. Pt states Dr. Sabra Heck told her to have this kind of mammogram done.

## 2014-01-13 ENCOUNTER — Ambulatory Visit (HOSPITAL_COMMUNITY)
Admission: RE | Admit: 2014-01-13 | Discharge: 2014-01-13 | Disposition: A | Discharge status: Home / Self Care | Payer: BC Managed Care – PPO | Source: Ambulatory Visit | Attending: Obstetrics & Gynecology | Admitting: Obstetrics & Gynecology

## 2014-01-13 ENCOUNTER — Encounter: Payer: Self-pay | Admitting: Obstetrics & Gynecology

## 2014-01-13 ENCOUNTER — Ambulatory Visit (INDEPENDENT_AMBULATORY_CARE_PROVIDER_SITE_OTHER): Payer: BC Managed Care – PPO | Admitting: Obstetrics & Gynecology

## 2014-01-13 VITALS — BP 114/60 | HR 68 | Resp 16 | Ht 62.5 in | Wt 219.0 lb

## 2014-01-13 DIAGNOSIS — IMO0002 Reserved for concepts with insufficient information to code with codable children: Secondary | ICD-10-CM | POA: Insufficient documentation

## 2014-01-13 DIAGNOSIS — Z1231 Encounter for screening mammogram for malignant neoplasm of breast: Secondary | ICD-10-CM | POA: Insufficient documentation

## 2014-01-13 DIAGNOSIS — Z1239 Encounter for other screening for malignant neoplasm of breast: Secondary | ICD-10-CM

## 2014-01-13 DIAGNOSIS — Z124 Encounter for screening for malignant neoplasm of cervix: Secondary | ICD-10-CM

## 2014-01-13 DIAGNOSIS — Z01419 Encounter for gynecological examination (general) (routine) without abnormal findings: Secondary | ICD-10-CM

## 2014-01-13 DIAGNOSIS — C4491 Basal cell carcinoma of skin, unspecified: Secondary | ICD-10-CM

## 2014-01-13 DIAGNOSIS — Z85828 Personal history of other malignant neoplasm of skin: Secondary | ICD-10-CM | POA: Insufficient documentation

## 2014-01-13 DIAGNOSIS — Z0000 Encounter for general adult medical examination without abnormal findings: Secondary | ICD-10-CM

## 2014-01-13 DIAGNOSIS — C801 Malignant (primary) neoplasm, unspecified: Secondary | ICD-10-CM

## 2014-01-13 LAB — POCT URINALYSIS DIPSTICK
Bilirubin, UA: NEGATIVE
Glucose, UA: NEGATIVE
Ketones, UA: NEGATIVE
Nitrite, UA: NEGATIVE
Protein, UA: NEGATIVE
Urobilinogen, UA: NEGATIVE
pH, UA: 7

## 2014-01-13 LAB — HEMOGLOBIN, FINGERSTICK: Hemoglobin, fingerstick: 14.3 g/dL (ref 12.0–16.0)

## 2014-01-13 NOTE — Progress Notes (Signed)
63 y.o. G3P3 MarriedCaucasianF here for annual exam.  Enjoying retirement.   Reports having intercourse, she sometimes feel a little pressure.  No vaginal bleeding or discharge.    Saw cardiologist this year.  Had another echocardiogram this year.  Also a stress test and this was a little abnormal.  So, CT was done.  On increased cholesterol medications.  Pt states she is tolerating this well.  Labs done this summer were all normal.  Cholesterol is in 160s with medication.    Patient's last menstrual period was 01/28/2007.          Sexually active: Yes.    The current method of family planning is tubal ligation.    Exercising: No.  The patient does not participate in regular exercise at present. Smoker:  no  Health Maintenance: Pap:  11/2011 Neg, HR HPV:Neg History of abnormal Pap:  no MMG:  11/2012 BIRADS1:Neg. Had one today. Colonoscopy:  12/2004 Normal - Repeat in 10 years  BMD:   04/2009, 1.6/0.4 TDaP:  2012 Screening Labs: PCP, Hb today: 14.3, Urine today: WBC=trace, RBC=trace, Ph=7.0   reports that she has never smoked. She has never used smokeless tobacco. She reports that she drinks about 3.6 oz of alcohol per week. She reports that she does not use illicit drugs.  Past Medical History  Diagnosis Date  . Perimenopausal menorrhagia   . Basal cell carcinoma 11/2011    face, left leg-using chemo cream on temple area  . Heart problem     98% blockage    Past Surgical History  Procedure Laterality Date  . Tubal ligation  1980    BTL  . Umbilical hernia repair  1980  . Vulvar cancer  1993    laser  . Hysteroscopy  11/2001    resection of benign leiomyoma  . Cardiac catheterization  02/2008  . Angioplasty  2005    CAD   98% blockage  . Thyroid cyst excision Left 10/2002    left lobe thyroid excision    Current Outpatient Prescriptions  Medication Sig Dispense Refill  . ALPRAZolam (XANAX) 0.25 MG tablet Take 0.25 mg by mouth as needed.     Marland Kitchen aspirin 81 MG tablet  Take 81 mg by mouth daily.    Marland Kitchen atorvastatin (LIPITOR) 80 MG tablet Take 1 tablet by mouth daily.    . clopidogrel (PLAVIX) 75 MG tablet Take 75 mg by mouth daily with breakfast.    . levothyroxine (SYNTHROID, LEVOTHROID) 75 MCG tablet Take 75 mcg by mouth daily before breakfast.    . losartan (COZAAR) 50 MG tablet 50 mg daily.    . metoprolol succinate (TOPROL-XL) 50 MG 24 hr tablet Take 50 mg by mouth daily. Take with or immediately following a meal.    . Multiple Vitamins-Minerals (MULTIVITAMIN PO) Take by mouth daily.    . Omega-3 Fatty Acids (FISH OIL PO) Take 1,000 mg by mouth. Two daily    . ranitidine (ZANTAC) 150 MG capsule      No current facility-administered medications for this visit.    Family History  Problem Relation Age of Onset  . Hypertension Mother   . Hypertension Father   . Diabetes Father   . Heart disease Father     ROS:  Pertinent items are noted in HPI.  Otherwise, a comprehensive ROS was negative.  Exam:   BP 114/60 mmHg  Pulse 68  Resp 16  Ht 5' 2.5" (1.588 m)  Wt 219 lb (99.338 kg)  BMI 39.39  kg/m2  LMP 01/28/2007  Weight change: -2#   Height: 5' 2.5" (158.8 cm)  Ht Readings from Last 3 Encounters:  01/13/14 5' 2.5" (1.588 m)  12/16/12 5' 2.5" (1.588 m)    General appearance: alert, cooperative and appears stated age Head: Normocephalic, without obvious abnormality, atraumatic Neck: no adenopathy, supple, symmetrical, trachea midline and thyroid normal to inspection and palpation Lungs: clear to auscultation bilaterally Breasts: normal appearance, no masses or tenderness Heart: regular rate and rhythm Abdomen: soft, non-tender; bowel sounds normal; no masses,  no organomegaly Extremities: extremities normal, atraumatic, no cyanosis or edema Skin: Skin color, texture, turgor normal. No rashes or lesions Lymph nodes: Cervical, supraclavicular, and axillary nodes normal. No abnormal inguinal nodes palpated Neurologic: Grossly  normal   Pelvic: External genitalia:  no lesions              Urethra:  normal appearing urethra with no masses, tenderness or lesions              Bartholins and Skenes: normal                 Vagina: normal appearing vagina with normal color and discharge, no lesions              Cervix: no lesions              Pap taken: Yes.   Bimanual Exam:  Uterus:  normal size, contour, position, consistency, mobility, non-tender              Adnexa: normal adnexa and no mass, fullness, tenderness               Rectovaginal: Confirms               Anus:  normal sphincter tone, no lesions  Chaperone was present for exam.  A:  Well Woman with normal exam PMP, No HRT H/O CAD, cath 2/10, see cardiology every 6 months.  Reports normal echo, abnormal stress test, CT scan was fine. H/O BCC/SCC.  Goes to dermatologist ever six months  P: Mammogram yearly.Done today. Labs with cardiology pap smear with neg HR HPV 2013. Pap today. Colonoscopy due next year return annually or prn

## 2014-01-13 NOTE — Addendum Note (Signed)
Addended by: Megan Salon on: 01/13/2014 11:21 AM   Modules accepted: Miquel Dunn

## 2014-01-16 LAB — IPS PAP TEST WITH REFLEX TO HPV

## 2014-02-24 ENCOUNTER — Telehealth: Payer: Self-pay | Admitting: Obstetrics & Gynecology

## 2014-02-24 DIAGNOSIS — Z87898 Personal history of other specified conditions: Secondary | ICD-10-CM

## 2014-02-24 NOTE — Telephone Encounter (Signed)
New order entered for patient for 3D bilateral imaging at the [facility] code entered as history of abnormal mammogram. Patient had abnormal mammogram in 2013 with possible right breat mass which has been followed with further imaging.  Routing to provider for final review. Patient agreeable to disposition. Will close encounter

## 2014-02-24 NOTE — Telephone Encounter (Signed)
°  [employer]  calling for patient (with patient on the other line) regarding her 3D mmg done at [facility]. [employer] is asking if the dx could be changed from screening to her problem dx and sent to [facility] imaging. [employer] is stating that last year her 3D mmg was covered due to her problem dx.

## 2014-03-01 ENCOUNTER — Telehealth: Payer: Self-pay | Admitting: Obstetrics & Gynecology

## 2014-03-01 NOTE — Telephone Encounter (Signed)
Encounter opened in error. Will close

## 2014-03-01 NOTE — Telephone Encounter (Signed)
Made in error will close encounter

## 2014-03-01 NOTE — Telephone Encounter (Signed)
Routing to Sally Yeakley, RN for review. 

## 2014-03-01 NOTE — Telephone Encounter (Signed)
Spoke with patient. Patient states that she had mammogram done on 01/13/2014. Patient states that her insurance did not cover this mammogram and she needs to have the coding changed for the bill. Advised patient since we are not the facility that billed for the mammogram may need to speak with [facility] billing. "I really do not want to have to call all around to get this fixed." Advised patient would call over to [employer] [employer] to speak with someone and return call to patient with further information. Patient is agreeable.

## 2014-03-01 NOTE — Telephone Encounter (Signed)
Spoke with patient advised of message received from insurance company and [place]. Patient states "I was told that it would be covered when I called [facility] before scheduling. Now the insurance company says that it will be covered if the diagnoses are the same as my mammogram from 04-29-2012." 3D screening mammogram from April 29, 2012 was coded as other screening mammogram with ICD10 of Z12.31. 3D screening mammogram from 04/29/2013 was coded as breast cancer screening with ICD 10 code of Z76.12. Patient would like me to speak with our billing department and [facility] from [place] in regards to this. Denise with [employer] direct line is [phone]. Advised will speak with billing and insurance and return call with recommendations. Patient is agreeable.

## 2014-03-01 NOTE — Telephone Encounter (Signed)
Spoke with [place] who states that they are not able to make any adjustments to the code with the mammogram. Spoke with Jorene Guest at [facility] to initiate prior authorization for patient's mammogram that she had on 01/13/2014. Jorene Guest states that prior authorization is not required for is imaging.

## 2014-03-02 NOTE — Telephone Encounter (Addendum)
Billing issue. Will call patient for confirmation and refer to correct business office to assist her in resolution.  Routing to provider for final review.  Will close encounter

## 2014-03-03 ENCOUNTER — Telehealth: Payer: Self-pay | Admitting: *Deleted

## 2014-03-03 NOTE — Telephone Encounter (Addendum)
Patient has billing issue with coding of screening MMG with 3D that was not covered by insurance. States this was recommended by Dr Sabra Heck. Advised although this is our recommendation, it is not always covered by insurance. Referred to [facility] billing office. Advised we can provide letter with our recommendation to insurance company (with copy mailed to her) but it may not change outcome.     Routing to Dr Sabra Heck for letter.

## 2014-03-10 ENCOUNTER — Encounter: Payer: Self-pay | Admitting: Obstetrics & Gynecology

## 2014-03-17 NOTE — Telephone Encounter (Signed)
Reviewed with Dr Sabra Heck, need to know why MMG was denied. Call to [place] at [facility]. They are working with patient. Screening MMG was denied but is an insurance issue, not a coding issue. Is not related to 3D and patient is not being charged for the 3D. They will contact us if any clinical needed. No clinical needed at this time and they are in touch with the patient.

## 2014-03-17 NOTE — Telephone Encounter (Signed)
Call to patient to notify will not be sending letter at this time. Left message to call back.

## 2014-03-17 NOTE — Telephone Encounter (Signed)
Thank you.  Ok to close encounter. 

## 2014-03-20 NOTE — Telephone Encounter (Signed)
Patient returned call. Update provided and notified we will step back from this issue at this time.

## 2014-05-25 DIAGNOSIS — M179 Osteoarthritis of knee, unspecified: Secondary | ICD-10-CM | POA: Insufficient documentation

## 2014-05-25 DIAGNOSIS — M1711 Unilateral primary osteoarthritis, right knee: Secondary | ICD-10-CM | POA: Insufficient documentation

## 2014-05-25 DIAGNOSIS — S83249A Other tear of medial meniscus, current injury, unspecified knee, initial encounter: Secondary | ICD-10-CM | POA: Insufficient documentation

## 2014-05-25 DIAGNOSIS — M1710 Unilateral primary osteoarthritis, unspecified knee: Secondary | ICD-10-CM | POA: Insufficient documentation

## 2014-06-01 ENCOUNTER — Other Ambulatory Visit: Payer: Self-pay | Admitting: Physician Assistant

## 2014-06-01 DIAGNOSIS — S83221D Peripheral tear of medial meniscus, current injury, right knee, subsequent encounter: Secondary | ICD-10-CM

## 2014-06-08 ENCOUNTER — Ambulatory Visit
Admission: RE | Admit: 2014-06-08 | Discharge: 2014-06-08 | Disposition: A | Discharge status: Home / Self Care | Payer: BLUE CROSS/BLUE SHIELD | Source: Ambulatory Visit | Attending: Physician Assistant | Admitting: Physician Assistant

## 2014-06-08 DIAGNOSIS — M7121 Synovial cyst of popliteal space [Baker], right knee: Secondary | ICD-10-CM | POA: Diagnosis not present

## 2014-06-08 DIAGNOSIS — M25561 Pain in right knee: Secondary | ICD-10-CM | POA: Diagnosis not present

## 2014-06-08 DIAGNOSIS — S83221D Peripheral tear of medial meniscus, current injury, right knee, subsequent encounter: Secondary | ICD-10-CM

## 2014-08-10 ENCOUNTER — Encounter
Admission: RE | Admit: 2014-08-10 | Discharge: 2014-08-10 | Disposition: A | Discharge status: Home / Self Care | Payer: BLUE CROSS/BLUE SHIELD | Source: Ambulatory Visit | Attending: Orthopedic Surgery | Admitting: Orthopedic Surgery

## 2014-08-10 DIAGNOSIS — Z01812 Encounter for preprocedural laboratory examination: Secondary | ICD-10-CM | POA: Diagnosis not present

## 2014-08-10 DIAGNOSIS — M2391 Unspecified internal derangement of right knee: Secondary | ICD-10-CM | POA: Diagnosis not present

## 2014-08-10 HISTORY — DX: Atherosclerotic heart disease of native coronary artery without angina pectoris: I25.10

## 2014-08-10 HISTORY — DX: Anxiety disorder, unspecified: F41.9

## 2014-08-10 HISTORY — DX: Hypothyroidism, unspecified: E03.9

## 2014-08-10 HISTORY — DX: Essential (primary) hypertension: I10

## 2014-08-10 HISTORY — DX: Sleep apnea, unspecified: G47.30

## 2014-08-10 HISTORY — DX: Unspecified osteoarthritis, unspecified site: M19.90

## 2014-08-10 LAB — CBC
HCT: 41.3 % (ref 35.0–47.0)
Hemoglobin: 13.7 g/dL (ref 12.0–16.0)
MCH: 31.2 pg (ref 26.0–34.0)
MCHC: 33.1 g/dL (ref 32.0–36.0)
MCV: 94.1 fL (ref 80.0–100.0)
PLATELETS: 219 10*3/uL (ref 150–440)
RBC: 4.39 MIL/uL (ref 3.80–5.20)
RDW: 12.8 % (ref 11.5–14.5)
WBC: 6.4 10*3/uL (ref 3.6–11.0)

## 2014-08-10 LAB — BASIC METABOLIC PANEL
Anion gap: 6 (ref 5–15)
BUN: 11 mg/dL (ref 6–20)
CALCIUM: 9.1 mg/dL (ref 8.9–10.3)
CO2: 28 mmol/L (ref 22–32)
CREATININE: 0.68 mg/dL (ref 0.44–1.00)
Chloride: 108 mmol/L (ref 101–111)
GFR calc Af Amer: >60 mL/min (ref >60)
Glucose, Bld: 90 mg/dL (ref 65–99)
POTASSIUM: 3.5 mmol/L (ref 3.5–5.1)
SODIUM: 142 mmol/L (ref 135–145)

## 2014-08-10 LAB — DIFFERENTIAL
BASOS ABS: 0.1 10*3/uL (ref 0–0.1)
Basophils Relative: 1 %
EOS ABS: 0.3 10*3/uL (ref 0–0.7)
EOS PCT: 5 %
LYMPHS PCT: 30 %
Lymphs Abs: 1.9 10*3/uL (ref 1.0–3.6)
MONO ABS: 0.5 10*3/uL (ref 0.2–0.9)
Monocytes Relative: 8 %
NEUTROS ABS: 3.5 10*3/uL (ref 1.4–6.5)
NEUTROS PCT: 56 %

## 2014-08-10 NOTE — Patient Instructions (Signed)
  Your procedure is scheduled on:08/16/14 Report to Day Surgery. [facility] Eyvonne Mechanic find out your arrival time please call [phone] between 1PM - 3PM on7/19/16  Remember: Instructions that are not followed completely may result in serious medical risk, up to and including death, or upon the discretion of your surgeon and anesthesiologist your surgery may need to be rescheduled.    X____ 1. Do not eat food or drink liquids after midnight. No gum chewing or hard candies.     _X___ 2. No Alcohol for 24 hours before or after surgery.   ____ 3. Bring all medications with you on the day of surgery if instructed.    _X___ 4. Notify your doctor if there is any change in your medical condition     (cold, fever, infections).     Do not wear jewelry, make-up, hairpins, clips or nail polish.  Do not wear lotions, powders, or perfumes. You may wear deodorant.  Do not shave 48 hours prior to surgery. Men may shave face and neck.  Do not bring valuables to the hospital.    [facility] is not responsible for any belongings or valuables.               Contacts, dentures or bridgework may not be worn into surgery.  Leave your suitcase in the car. After surgery it may be brought to your room.  For patients admitted to the hospital, discharge time is determined by your                treatment team.   Patients discharged the day of surgery will not be allowed to drive home.   Please read over the following fact sheets that you were given:   Surgical Site Infection Prevention   ____ Take these medicines the morning of surgery with A SIP OF WATER:    1. RANITIDINE  2.LOSARTAN   3. LEVOTHYROXIN  4.METOPROLOL  5.  6.  ____ Fleet Enema (as directed)   __X__ Use CHG Soap as directed  ____ Use inhalers on the day of surgery  ____ Stop metformin 2 days prior to surgery    ____ Take 1/2 of usual insulin dose the night before surgery and none on the morning of surgery.   ____ Stop  Coumadin/Plavix/aspirin on     PLAVIX AND ASPIRIN ALREADY STOPPED  ____ Stop Anti-inflammatories on   ____ Stop supplements until after surgery.     ____ Bring C-Pap to the hospital.

## 2014-08-16 ENCOUNTER — Ambulatory Visit
Admission: RE | Admit: 2014-08-16 | Discharge: 2014-08-16 | Disposition: A | Discharge status: Home / Self Care | Payer: BLUE CROSS/BLUE SHIELD | Source: Ambulatory Visit | Attending: Orthopedic Surgery | Admitting: Orthopedic Surgery

## 2014-08-16 ENCOUNTER — Ambulatory Visit: Payer: BLUE CROSS/BLUE SHIELD | Admitting: Anesthesiology

## 2014-08-16 ENCOUNTER — Encounter: Payer: Self-pay | Admitting: *Deleted

## 2014-08-16 ENCOUNTER — Encounter
Admission: RE | Disposition: A | Discharge status: Home / Self Care | Payer: Self-pay | Source: Ambulatory Visit | Attending: Orthopedic Surgery

## 2014-08-16 DIAGNOSIS — E038 Other specified hypothyroidism: Secondary | ICD-10-CM | POA: Diagnosis not present

## 2014-08-16 DIAGNOSIS — M23251 Derangement of posterior horn of lateral meniscus due to old tear or injury, right knee: Secondary | ICD-10-CM | POA: Diagnosis not present

## 2014-08-16 DIAGNOSIS — Z91048 Other nonmedicinal substance allergy status: Secondary | ICD-10-CM | POA: Insufficient documentation

## 2014-08-16 DIAGNOSIS — Z833 Family history of diabetes mellitus: Secondary | ICD-10-CM | POA: Insufficient documentation

## 2014-08-16 DIAGNOSIS — G4730 Sleep apnea, unspecified: Secondary | ICD-10-CM | POA: Insufficient documentation

## 2014-08-16 DIAGNOSIS — E785 Hyperlipidemia, unspecified: Secondary | ICD-10-CM | POA: Insufficient documentation

## 2014-08-16 DIAGNOSIS — M1990 Unspecified osteoarthritis, unspecified site: Secondary | ICD-10-CM | POA: Diagnosis not present

## 2014-08-16 DIAGNOSIS — Z880 Allergy status to penicillin: Secondary | ICD-10-CM | POA: Diagnosis not present

## 2014-08-16 DIAGNOSIS — Z7982 Long term (current) use of aspirin: Secondary | ICD-10-CM | POA: Diagnosis not present

## 2014-08-16 DIAGNOSIS — Z955 Presence of coronary angioplasty implant and graft: Secondary | ICD-10-CM | POA: Diagnosis not present

## 2014-08-16 DIAGNOSIS — Z79899 Other long term (current) drug therapy: Secondary | ICD-10-CM | POA: Diagnosis not present

## 2014-08-16 DIAGNOSIS — M25561 Pain in right knee: Secondary | ICD-10-CM | POA: Diagnosis present

## 2014-08-16 DIAGNOSIS — M94261 Chondromalacia, right knee: Secondary | ICD-10-CM | POA: Insufficient documentation

## 2014-08-16 DIAGNOSIS — R0600 Dyspnea, unspecified: Secondary | ICD-10-CM | POA: Diagnosis not present

## 2014-08-16 DIAGNOSIS — E049 Nontoxic goiter, unspecified: Secondary | ICD-10-CM | POA: Insufficient documentation

## 2014-08-16 DIAGNOSIS — I2510 Atherosclerotic heart disease of native coronary artery without angina pectoris: Secondary | ICD-10-CM | POA: Insufficient documentation

## 2014-08-16 DIAGNOSIS — Z8261 Family history of arthritis: Secondary | ICD-10-CM | POA: Diagnosis not present

## 2014-08-16 DIAGNOSIS — Z8262 Family history of osteoporosis: Secondary | ICD-10-CM | POA: Diagnosis not present

## 2014-08-16 DIAGNOSIS — M23241 Derangement of anterior horn of lateral meniscus due to old tear or injury, right knee: Secondary | ICD-10-CM | POA: Diagnosis not present

## 2014-08-16 DIAGNOSIS — I10 Essential (primary) hypertension: Secondary | ICD-10-CM | POA: Insufficient documentation

## 2014-08-16 DIAGNOSIS — Z882 Allergy status to sulfonamides status: Secondary | ICD-10-CM | POA: Insufficient documentation

## 2014-08-16 HISTORY — PX: KNEE ARTHROSCOPY: SHX127

## 2014-08-16 SURGERY — ARTHROSCOPY, KNEE
Anesthesia: General | Laterality: Right

## 2014-08-16 MED ORDER — LIDOCAINE HCL (CARDIAC) 20 MG/ML IV SOLN
INTRAVENOUS | Status: DC | PRN
  Administered 2014-08-16: 50 mg via INTRAVENOUS

## 2014-08-16 MED ORDER — CLINDAMYCIN PHOSPHATE 900 MG/50ML IV SOLN
INTRAVENOUS | Status: AC
Start: 2014-08-16 — End: 2014-08-16
  Administered 2014-08-16: 900 mg via INTRAVENOUS
  Filled 2014-08-16: qty 50

## 2014-08-16 MED ORDER — MIDAZOLAM HCL 2 MG/2ML IJ SOLN
INTRAMUSCULAR | Status: DC | PRN
  Administered 2014-08-16: 2 mg via INTRAVENOUS

## 2014-08-16 MED ORDER — DEXAMETHASONE SODIUM PHOSPHATE 4 MG/ML IJ SOLN
INTRAMUSCULAR | Status: DC | PRN
  Administered 2014-08-16: 5 mg via INTRAVENOUS

## 2014-08-16 MED ORDER — GLYCOPYRROLATE 0.2 MG/ML IJ SOLN
INTRAMUSCULAR | Status: DC | PRN
  Administered 2014-08-16: 0.2 mg via INTRAVENOUS

## 2014-08-16 MED ORDER — FAMOTIDINE 20 MG PO TABS
20.0000 mg | ORAL_TABLET | Freq: Once | ORAL | Status: AC
  Administered 2014-08-16: 20 mg via ORAL

## 2014-08-16 MED ORDER — ONDANSETRON HCL 4 MG/2ML IJ SOLN: 4.0000 mg | Freq: Four times a day (QID) | INTRAMUSCULAR | Status: DC | PRN

## 2014-08-16 MED ORDER — CLINDAMYCIN PHOSPHATE 900 MG/50ML IV SOLN: 900.0000 mg | Freq: Once | INTRAVENOUS | Status: DC

## 2014-08-16 MED ORDER — ACETAMINOPHEN 10 MG/ML IV SOLN
INTRAVENOUS | Status: AC
  Filled 2014-08-16: qty 100

## 2014-08-16 MED ORDER — FAMOTIDINE 20 MG PO TABS
ORAL_TABLET | ORAL | Status: AC
  Administered 2014-08-16: 20 mg via ORAL
  Filled 2014-08-16: qty 1

## 2014-08-16 MED ORDER — ONDANSETRON HCL 4 MG/2ML IJ SOLN
INTRAMUSCULAR | Status: DC | PRN
  Administered 2014-08-16: 4 mg via INTRAVENOUS

## 2014-08-16 MED ORDER — ONDANSETRON HCL 4 MG PO TABS: 4.0000 mg | ORAL_TABLET | Freq: Four times a day (QID) | ORAL | Status: DC | PRN

## 2014-08-16 MED ORDER — FENTANYL CITRATE (PF) 100 MCG/2ML IJ SOLN
25.0000 ug | INTRAMUSCULAR | Status: DC | PRN
  Administered 2014-08-16 (×2): 25 ug via INTRAVENOUS

## 2014-08-16 MED ORDER — EPHEDRINE SULFATE 50 MG/ML IJ SOLN
INTRAMUSCULAR | Status: DC | PRN
  Administered 2014-08-16 (×2): 10 mg via INTRAVENOUS

## 2014-08-16 MED ORDER — FENTANYL CITRATE (PF) 100 MCG/2ML IJ SOLN
INTRAMUSCULAR | Status: DC | PRN
  Administered 2014-08-16: 50 ug via INTRAVENOUS

## 2014-08-16 MED ORDER — METOCLOPRAMIDE HCL 10 MG PO TABS: 5.0000 mg | ORAL_TABLET | Freq: Three times a day (TID) | ORAL | Status: DC | PRN

## 2014-08-16 MED ORDER — BUPIVACAINE-EPINEPHRINE 0.25% -1:200000 IJ SOLN
INTRAMUSCULAR | Status: DC | PRN
  Administered 2014-08-16: 5 mL

## 2014-08-16 MED ORDER — ONDANSETRON HCL 4 MG/2ML IJ SOLN: 4.0000 mg | Freq: Once | INTRAMUSCULAR | Status: DC | PRN

## 2014-08-16 MED ORDER — METOCLOPRAMIDE HCL 5 MG/ML IJ SOLN: 5.0000 mg | Freq: Three times a day (TID) | INTRAMUSCULAR | Status: DC | PRN

## 2014-08-16 MED ORDER — PROPOFOL 10 MG/ML IV BOLUS
INTRAVENOUS | Status: DC | PRN
  Administered 2014-08-16: 200 mg via INTRAVENOUS

## 2014-08-16 MED ORDER — FENTANYL CITRATE (PF) 100 MCG/2ML IJ SOLN
INTRAMUSCULAR | Status: AC
  Filled 2014-08-16: qty 2

## 2014-08-16 MED ORDER — HYDROCODONE-ACETAMINOPHEN 5-325 MG PO TABS
ORAL_TABLET | ORAL | Status: AC
  Administered 2014-08-16: 1 via ORAL
  Filled 2014-08-16: qty 1

## 2014-08-16 MED ORDER — LACTATED RINGERS IV SOLN
INTRAVENOUS | Status: DC
  Administered 2014-08-16 (×3): via INTRAVENOUS

## 2014-08-16 MED ORDER — ACETAMINOPHEN 10 MG/ML IV SOLN
INTRAVENOUS | Status: DC | PRN
  Administered 2014-08-16: 1000 mg via INTRAVENOUS

## 2014-08-16 MED ORDER — MORPHINE SULFATE 4 MG/ML IJ SOLN
INTRAMUSCULAR | Status: AC
  Filled 2014-08-16: qty 1

## 2014-08-16 MED ORDER — BUPIVACAINE-EPINEPHRINE (PF) 0.25% -1:200000 IJ SOLN
INTRAMUSCULAR | Status: AC
Start: 2014-08-16 — End: 2014-08-16
  Filled 2014-08-16: qty 30

## 2014-08-16 MED ORDER — PHENYLEPHRINE HCL 10 MG/ML IJ SOLN
INTRAMUSCULAR | Status: DC | PRN
  Administered 2014-08-16 (×2): 100 ug via INTRAVENOUS

## 2014-08-16 MED ORDER — HYDROMORPHONE HCL 1 MG/ML IJ SOLN: 0.2500 mg | INTRAMUSCULAR | Status: DC | PRN

## 2014-08-16 MED ORDER — HYDROCODONE-ACETAMINOPHEN 5-325 MG PO TABS: 1.0000 | ORAL_TABLET | ORAL | Status: DC | PRN

## 2014-08-16 MED ORDER — HYDROCODONE-ACETAMINOPHEN 5-325 MG PO TABS
1.0000 | ORAL_TABLET | ORAL | Status: DC | PRN
  Administered 2014-08-16: 1 via ORAL

## 2014-08-16 SURGICAL SUPPLY — 25 items
BLADE SHAVER 4.5 DBL SERAT CV (CUTTER) ×2 IMPLANT
BNDG ESMARK 6X12 TAN STRL LF (GAUZE/BANDAGES/DRESSINGS) ×2 IMPLANT
DRSG DERMACEA 8X12 NADH (GAUZE/BANDAGES/DRESSINGS) ×2 IMPLANT
DURAPREP 26ML APPLICATOR (WOUND CARE) ×4 IMPLANT
GAUZE SPONGE 4X4 12PLY STRL (GAUZE/BANDAGES/DRESSINGS) ×2 IMPLANT
GLOVE BIOGEL M STRL SZ7.5 (GLOVE) ×2 IMPLANT
GLOVE INDICATOR 8.0 STRL GRN (GLOVE) ×2 IMPLANT
GOWN STRL REUS W/ TWL LRG LVL3 (GOWN DISPOSABLE) ×1 IMPLANT
GOWN STRL REUS W/TWL LRG LVL3 (GOWN DISPOSABLE) ×2
GOWN STRL REUS W/TWL XL LVL4 (GOWN DISPOSABLE) ×2 IMPLANT
IV LACTATED RINGER IRRG 3000ML (IV SOLUTION) ×12
IV LR IRRIG 3000ML ARTHROMATIC (IV SOLUTION) ×6 IMPLANT
MANIFOLD NEPTUNE II (INSTRUMENTS) ×2 IMPLANT
PACK ARTHROSCOPY KNEE (MISCELLANEOUS) ×2 IMPLANT
PAD CAST CTTN 4X4 STRL (SOFTGOODS) IMPLANT
PADDING CAST COTTON 4X4 STRL (SOFTGOODS) ×2
SET TUBE SUCT SHAVER OUTFL 24K (TUBING) ×2 IMPLANT
SET TUBE TIP INTRA-ARTICULAR (MISCELLANEOUS) ×2 IMPLANT
STOCKINETTE BIAS CUT 6 980064 (GAUZE/BANDAGES/DRESSINGS) ×1 IMPLANT
STRAP SAFETY BODY (MISCELLANEOUS) ×2 IMPLANT
SUT ETHILON 3-0 FS-10 30 BLK (SUTURE) ×2
SUTURE EHLN 3-0 FS-10 30 BLK (SUTURE) ×1 IMPLANT
TUBING ARTHRO INFLOW-ONLY STRL (TUBING) ×2 IMPLANT
WAND HAND CNTRL MULTIVAC 50 (MISCELLANEOUS) ×2 IMPLANT
WRAP KNEE W/COLD PACKS 25.5X14 (SOFTGOODS) ×2 IMPLANT

## 2014-08-16 NOTE — Anesthesia Procedure Notes (Signed)
Procedure Name: LMA Insertion Date/Time: 08/16/2014 12:16 PM Performed by: Eliberto Ivory Pre-anesthesia Checklist: Patient identified, Patient being monitored, Timeout performed, Emergency Drugs available and Suction available Patient Re-evaluated:Patient Re-evaluated prior to inductionOxygen Delivery Method: Circle system utilized Preoxygenation: Pre-oxygenation with 100% oxygen Intubation Type: IV induction Ventilation: Mask ventilation without difficulty LMA: LMA inserted LMA Size: 3.0 Grade View: Grade III Tube type: Oral Number of attempts: 1 Placement Confirmation: positive ETCO2 and breath sounds checked- equal and bilateral Tube secured with: Tape Dental Injury: Teeth and Oropharynx as per pre-operative assessment  Difficulty Due To: Difficult Airway- due to limited oral opening

## 2014-08-16 NOTE — Discharge Instructions (Signed)
°  Instructions after Knee Arthroscopy  ° ° James P. Hooten, Jr., M.D.    ° Dept. of Orthopaedics & Sports Medicine ° [facility] ° [address] ° , [place]  [address] ° ° Phone: [phone]   Fax: [phone] ° ° °DIET: °• Drink plenty of non-alcoholic fluids & begin a light diet. °• Resume your normal diet the day after surgery. ° °ACTIVITY:  °• You may use crutches or a walker with weight-bearing as tolerated, unless instructed otherwise. °• You may wean yourself off of the walker or crutches as tolerated.  °• Begin doing gentle exercises. Exercising will reduce the pain and swelling, increase motion, and prevent muscle weakness.   °• Avoid strenuous activities or athletics for a minimum of 4-6 weeks after arthroscopic surgery. °• Do not drive or operate any equipment until instructed. ° °WOUND CARE:  °• Place one to two pillows under the knee the first day or two when sitting or lying.  °• Continue to use the ice packs periodically to reduce pain and swelling. °• The small incisions in your knee are closed with nylon stitches. The stitches will be removed in the office. °• The bulky dressing may be removed on the second day after surgery. DO NOT TOUCH THE STITCHES. Put a Band-Aid over each stitch. Do NOT use any ointments or creams on the incisions.  °• You may bathe or shower after the stitches are removed at the first office visit following surgery. ° °MEDICATIONS: °• You may resume your regular medications. °• Please take the pain medication as prescribed. °• Do not take pain medication on an empty stomach. °• Do not drive or drink alcoholic beverages when taking pain medications. ° °CALL THE OFFICE FOR: °• Temperature above 101 degrees °• Excessive bleeding or drainage on the dressing. °• Excessive swelling, coldness, or paleness of the toes. °• Persistent nausea and vomiting. ° °FOLLOW-UP:  °You should have an appointment to return to the office in 7-10 days after surgery.  AMBULATORY  SURGERY  °DISCHARGE INSTRUCTIONS ° ° °The drugs that you were given will stay in your system until tomorrow so for the next 24 hours you should not: ° °Drive an automobile °Make any legal decisions °Drink any alcoholic beverage ° ° °You may resume regular meals tomorrow.  Today it is better to start with liquids and gradually work up to solid foods. ° °You may eat anything you prefer, but it is better to start with liquids, then soup and crackers, and gradually work up to solid foods. ° ° °Please notify your doctor immediately if you have any unusual bleeding, trouble breathing, redness and pain at the surgery site, drainage, fever, or pain not relieved by medication. ° ° ° °Additional Instructions: ° ° ° ° ° ° ° °• Please contact your physician with any problems or Same Day Surgery at [phone], Monday through Friday 6 am to 4 pm, or [facility] [place] Main number at [phone]. °

## 2014-08-16 NOTE — H&P (Signed)
The patient has been re-examined, and the chart reviewed, and there have been no interval changes to the documented history and physical.    The risks, benefits, and alternatives have been discussed at length. The patient expressed understanding of the risks benefits and agreed with plans for surgical intervention.  James P. Hooten, Jr. M.D.    

## 2014-08-16 NOTE — Transfer of Care (Signed)
Immediate Anesthesia Transfer of Care Note  Patient: Olivia Dougherty  Procedure(s) Performed: Procedure(s): ARTHROSCOPY KNEE, partial lateral menisectomy and chondroplasty (Right)  Patient Location: PACU  Anesthesia Type:General  Level of Consciousness: Alert, Awake, Oriented  Airway & Oxygen Therapy: Patient Spontanous Breathing  Post-op Assessment: Report given to RN  Post vital signs: Reviewed and stable  Last Vitals:  Filed Vitals:   08/16/14 1356  BP: 103/45  Pulse: 63  Temp:   Resp: 15    Complications: No apparent anesthesia complications

## 2014-08-16 NOTE — Op Note (Signed)
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   ASSISTANT: none  ANESTHESIA: general  ESTIMATED BLOOD LOSS: Minimal  FLUIDS REPLACED: 1000 mL of crystalloid  TOURNIQUET TIME: Not used   DRAINS: none  IMPLANTS UTILIZED: None  INDICATIONS FOR SURGERY: Olivia Dougherty is a 64 y.o. year old female who has been seen for complaints of right knee pain. MRI demonstrated findings consistent with meniscal pathology. After discussion of the risks and benefits of surgical intervention, the patient expressed understanding of the risks benefits and agree with plans for right knee arthroscopy.   PROCEDURE IN DETAIL: The patient was brought into the operating room and, after adequate general anesthesia was achieved, a tourniquet was applied to the right thigh and the leg was placed in the leg holder. All bony prominences were well padded. The patient's right knee was cleaned and prepped with alcohol and Duraprep and draped in the usual sterile fashion. A "timeout" was performed as per usual protocol. The anticipated portal sites were injected with 0.25% Marcaine with epinephrine. An anterolateral incision was made and a cannula was inserted. A medium effusion was evacuated and the knee was distended with fluid using the pump. The scope was advanced down the medial gutter into the medial compartment. Under visualization with the scope, an anteromedial portal was created and a hooked probe was inserted. The  medial meniscus was visualized and probed. Mild fraying of the inner rim of the medial meniscus was noted and this was debrided using the 50 ArthroCare wand. The articular cartilage was visualized. Grade 2-3 changes of chondromalacia were appreciated to both the medial femoral condyle and medial tibial plateau. These areas were debrided and abraded and contoured using the 50 ArthroCare wand.  The scope was then advanced into the intercondylar notch. The anterior cruciate ligament was visualized and probed and felt to be intact. The scope was removed from the lateral portal and reinserted via the anteromedial portal to better visualize the lateral compartment. The lateral meniscus was visualized and probed. There was a complex tear involving both the anterior and posterior horns of the lateral meniscus. The tear was debrided using a combination of meniscal punches and a 4.5 mm incisor shaver. Final contouring was performed using the 50 ArthroCare wand. The articular cartilage of the lateral compartment was visualized. Grade 3-4 chondromalacia was noted involving primarily the lateral tibial plateau. These areas were debrided and contoured using the ArthroCare wand. Finally, the scope was advanced so as to visualize the patellofemoral articulation. Good patellar tracking was appreciated. There was an area of grade 3 chondromalacia involving the intercondylar sulcus that was debrided using the ArthroCare wand.  The knee was irrigated with copius amounts of fluid and suctioned dry. The anterolateral portal was re-approximated with #3-0 nylon. A combination of 0.25% Marcaine with epinephrine and 4 mg of Morphine were injected via the scope. The scope was removed and the anteromedial portal was re-approximated with #3-0 nylon. A sterile dressing was applied followed by application of an ice wrap.  The  patient tolerated the procedure well and was transported to the PACU in stable condition.  James P. Holley Bouche., M.D.

## 2014-08-16 NOTE — Anesthesia Preprocedure Evaluation (Signed)
Anesthesia Evaluation  Patient identified by MRN, date of birth, ID band Patient awake    Reviewed: Allergy & Precautions, H&P , NPO status , Patient's Chart, lab work & pertinent test results, reviewed documented beta blocker date and time   Airway Mallampati: II  TM Distance: >3 FB Neck ROM: full    Dental   Pulmonary sleep apnea ,          Cardiovascular hypertension, + CAD and + Cardiac Stents Normal cardiovascular examRate:Normal     Neuro/Psych    GI/Hepatic   Endo/Other  Hypothyroidism   Renal/GU      Musculoskeletal   Abdominal   Peds  Hematology   Anesthesia Other Findings   Reproductive/Obstetrics                             Anesthesia Physical Anesthesia Plan  ASA: III  Anesthesia Plan: General LMA   Post-op Pain Management:    Induction:   Airway Management Planned:   Additional Equipment:   Intra-op Plan:   Post-operative Plan:   Informed Consent: I have reviewed the patients History and Physical, chart, labs and discussed the procedure including the risks, benefits and alternatives for the proposed anesthesia with the patient or authorized representative who has indicated his/her understanding and acceptance.     Plan Discussed with: CRNA  Anesthesia Plan Comments:         Anesthesia Quick Evaluation

## 2014-08-17 NOTE — Anesthesia Postprocedure Evaluation (Signed)
  Anesthesia Post-op Note  Patient: Olivia Dougherty  Procedure(s) Performed: Procedure(s): ARTHROSCOPY KNEE, partial lateral menisectomy and chondroplasty (Right)  Anesthesia type:General LMA  Patient location: PACU  Post pain: Pain level controlled  Post assessment: Post-op Vital signs reviewed, Patient's Cardiovascular Status Stable, Respiratory Function Stable, Patent Airway and No signs of Nausea or vomiting  Post vital signs: Reviewed and stable  Last Vitals:  Filed Vitals:   08/16/14 1518  BP: 126/61  Pulse: 66  Temp:   Resp: 16    Level of consciousness: awake, alert  and patient cooperative  Complications: No apparent anesthesia complications

## 2014-11-29 ENCOUNTER — Telehealth: Payer: Self-pay | Admitting: Gastroenterology

## 2014-11-29 NOTE — Telephone Encounter (Signed)
Colonoscopy triage °

## 2014-12-01 ENCOUNTER — Other Ambulatory Visit: Payer: Self-pay

## 2014-12-01 DIAGNOSIS — Z1231 Encounter for screening mammogram for malignant neoplasm of breast: Secondary | ICD-10-CM

## 2014-12-01 NOTE — Telephone Encounter (Signed)
LVM for pt to return my call.

## 2014-12-04 ENCOUNTER — Telehealth: Payer: Self-pay

## 2014-12-04 ENCOUNTER — Other Ambulatory Visit: Payer: Self-pay

## 2014-12-04 DIAGNOSIS — Z1211 Encounter for screening for malignant neoplasm of colon: Secondary | ICD-10-CM

## 2014-12-04 NOTE — Telephone Encounter (Signed)
Gastroenterology Pre-Procedure Review  Request Date: 12/29/14 Requesting Physician: Dr. Lamonte Sakai  PATIENT REVIEW QUESTIONS: The patient responded to the following health history questions as indicated:    1. Are you having any GI issues? no 2. Do you have a personal history of Polyps? yes (All benign) 3. Do you have a family history of Colon Cancer or Polyps? no 4. Diabetes Mellitus? no 5. Joint replacements in the past 12 months?yes (Knee scope July 2016) 6. Major health problems in the past 3 months?no 7. Any artificial heart valves, MVP, or defibrillator?yes (Heart stent 2005)    MEDICATIONS & ALLERGIES:    Patient reports the following regarding taking any anticoagulation/antiplatelet therapy:   Plavix, Coumadin, Eliquis, Xarelto, Lovenox, Pradaxa, Brilinta, or Effient? yes (Plavix 75mg ) Aspirin? yes (Asa 81mg )  Patient confirms/reports the following medications:  Current Outpatient Prescriptions  Medication Sig Dispense Refill  . ALPRAZolam (XANAX) 0.25 MG tablet Take 0.25 mg by mouth as needed.     Marland Kitchen aspirin 81 MG tablet Take 81 mg by mouth daily.    Marland Kitchen atorvastatin (LIPITOR) 80 MG tablet Take 1 tablet by mouth daily.    . clopidogrel (PLAVIX) 75 MG tablet Take 75 mg by mouth daily with breakfast.    . diphenhydramine-acetaminophen (TYLENOL PM) 25-500 MG TABS Take 1 tablet by mouth at bedtime as needed.    . fluticasone (FLONASE) 50 MCG/ACT nasal spray Place 2 sprays into both nostrils daily.    Marland Kitchen HYDROcodone-acetaminophen (NORCO) 5-325 MG per tablet Take 1-2 tablets by mouth every 4 (four) hours as needed for moderate pain. 60 tablet 0  . levothyroxine (SYNTHROID, LEVOTHROID) 75 MCG tablet Take 75 mcg by mouth daily before breakfast.    . loratadine (CLARITIN) 10 MG tablet Take 10 mg by mouth daily.    Marland Kitchen losartan (COZAAR) 50 MG tablet 100 mg daily.     . meloxicam (MOBIC) 7.5 MG tablet Take 7.5 mg by mouth 2 (two) times daily.    . metoprolol tartrate (LOPRESSOR) 25 MG  tablet Take 25 mg by mouth 2 (two) times daily.    . Multiple Vitamins-Minerals (MULTIVITAMIN PO) Take by mouth daily.    . ranitidine (ZANTAC) 150 MG capsule 150 mg 2 (two) times daily.      No current facility-administered medications for this visit.    Patient confirms/reports the following allergies:  Allergies  Allergen Reactions  . Adhesive [Tape]     rash  . Codeine   . Penicillins     hives  . Sulfa Antibiotics Other (See Comments)    hives    No orders of the defined types were placed in this encounter.    AUTHORIZATION INFORMATION Primary Insurance: 1D#: Group #:  Secondary Insurance: 1D#: Group #:  SCHEDULE INFORMATION: Date: 12/29/14 Time: Location: [place]

## 2014-12-13 NOTE — Telephone Encounter (Signed)
Colon 12/29/2014 [place]

## 2014-12-13 NOTE — Telephone Encounter (Signed)
Colon 12/29/2014 [place]

## 2014-12-18 ENCOUNTER — Telehealth: Payer: Self-pay

## 2014-12-18 NOTE — Telephone Encounter (Signed)
Pt's husband notified that pt will need to stop her plavix 75mg  6 days prior to her colonoscopy per Dr. Humphrey Rolls at [facility], Cardiology dept.. Blood thinner request form has been scanned into [employer].

## 2014-12-19 ENCOUNTER — Encounter: Payer: Self-pay | Admitting: *Deleted

## 2014-12-28 NOTE — Discharge Instructions (Signed)

## 2014-12-29 ENCOUNTER — Ambulatory Visit: Payer: BLUE CROSS/BLUE SHIELD | Admitting: Anesthesiology

## 2014-12-29 ENCOUNTER — Other Ambulatory Visit: Payer: Self-pay | Admitting: Gastroenterology

## 2014-12-29 ENCOUNTER — Encounter
Admission: RE | Disposition: A | Discharge status: Home / Self Care | Payer: Self-pay | Source: Ambulatory Visit | Attending: Gastroenterology

## 2014-12-29 ENCOUNTER — Ambulatory Visit
Admission: RE | Admit: 2014-12-29 | Discharge: 2014-12-29 | Disposition: A | Discharge status: Home / Self Care | Payer: BLUE CROSS/BLUE SHIELD | Source: Ambulatory Visit | Attending: Gastroenterology | Admitting: Gastroenterology

## 2014-12-29 DIAGNOSIS — Z7982 Long term (current) use of aspirin: Secondary | ICD-10-CM | POA: Diagnosis not present

## 2014-12-29 DIAGNOSIS — M1990 Unspecified osteoarthritis, unspecified site: Secondary | ICD-10-CM | POA: Insufficient documentation

## 2014-12-29 DIAGNOSIS — I10 Essential (primary) hypertension: Secondary | ICD-10-CM | POA: Diagnosis not present

## 2014-12-29 DIAGNOSIS — E7800 Pure hypercholesterolemia, unspecified: Secondary | ICD-10-CM | POA: Diagnosis not present

## 2014-12-29 DIAGNOSIS — G4730 Sleep apnea, unspecified: Secondary | ICD-10-CM | POA: Diagnosis not present

## 2014-12-29 DIAGNOSIS — Z880 Allergy status to penicillin: Secondary | ICD-10-CM | POA: Insufficient documentation

## 2014-12-29 DIAGNOSIS — D122 Benign neoplasm of ascending colon: Secondary | ICD-10-CM | POA: Diagnosis not present

## 2014-12-29 DIAGNOSIS — I2510 Atherosclerotic heart disease of native coronary artery without angina pectoris: Secondary | ICD-10-CM | POA: Diagnosis not present

## 2014-12-29 DIAGNOSIS — Z955 Presence of coronary angioplasty implant and graft: Secondary | ICD-10-CM | POA: Diagnosis not present

## 2014-12-29 DIAGNOSIS — F419 Anxiety disorder, unspecified: Secondary | ICD-10-CM | POA: Diagnosis not present

## 2014-12-29 DIAGNOSIS — K219 Gastro-esophageal reflux disease without esophagitis: Secondary | ICD-10-CM | POA: Diagnosis not present

## 2014-12-29 DIAGNOSIS — E039 Hypothyroidism, unspecified: Secondary | ICD-10-CM | POA: Insufficient documentation

## 2014-12-29 DIAGNOSIS — Z1211 Encounter for screening for malignant neoplasm of colon: Secondary | ICD-10-CM | POA: Diagnosis present

## 2014-12-29 DIAGNOSIS — K641 Second degree hemorrhoids: Secondary | ICD-10-CM | POA: Insufficient documentation

## 2014-12-29 DIAGNOSIS — Z882 Allergy status to sulfonamides status: Secondary | ICD-10-CM | POA: Diagnosis not present

## 2014-12-29 DIAGNOSIS — Z79899 Other long term (current) drug therapy: Secondary | ICD-10-CM | POA: Diagnosis not present

## 2014-12-29 DIAGNOSIS — Z8249 Family history of ischemic heart disease and other diseases of the circulatory system: Secondary | ICD-10-CM | POA: Diagnosis not present

## 2014-12-29 DIAGNOSIS — K5730 Diverticulosis of large intestine without perforation or abscess without bleeding: Secondary | ICD-10-CM | POA: Insufficient documentation

## 2014-12-29 DIAGNOSIS — D124 Benign neoplasm of descending colon: Secondary | ICD-10-CM | POA: Insufficient documentation

## 2014-12-29 DIAGNOSIS — Z780 Asymptomatic menopausal state: Secondary | ICD-10-CM | POA: Insufficient documentation

## 2014-12-29 DIAGNOSIS — Z85828 Personal history of other malignant neoplasm of skin: Secondary | ICD-10-CM | POA: Insufficient documentation

## 2014-12-29 HISTORY — DX: Pure hypercholesterolemia, unspecified: E78.00

## 2014-12-29 HISTORY — PX: POLYPECTOMY: SHX5525

## 2014-12-29 HISTORY — DX: Other specified postprocedural states: Z98.890

## 2014-12-29 HISTORY — DX: Gastro-esophageal reflux disease without esophagitis: K21.9

## 2014-12-29 HISTORY — PX: COLONOSCOPY WITH PROPOFOL: SHX5780

## 2014-12-29 HISTORY — DX: Nausea with vomiting, unspecified: R11.2

## 2014-12-29 SURGERY — COLONOSCOPY WITH PROPOFOL
Anesthesia: Monitor Anesthesia Care | Wound class: Contaminated

## 2014-12-29 MED ORDER — STERILE WATER FOR IRRIGATION IR SOLN
Status: DC | PRN
  Administered 2014-12-29: 09:00:00

## 2014-12-29 MED ORDER — LACTATED RINGERS IV SOLN
INTRAVENOUS | Status: DC
  Administered 2014-12-29: 09:00:00 via INTRAVENOUS

## 2014-12-29 MED ORDER — PROPOFOL 10 MG/ML IV BOLUS
INTRAVENOUS | Status: DC | PRN
  Administered 2014-12-29: 30 mg via INTRAVENOUS
  Administered 2014-12-29: 20 mg via INTRAVENOUS
  Administered 2014-12-29 (×5): 50 mg via INTRAVENOUS
  Administered 2014-12-29: 100 mg via INTRAVENOUS

## 2014-12-29 MED ORDER — LIDOCAINE HCL (CARDIAC) 20 MG/ML IV SOLN
INTRAVENOUS | Status: DC | PRN
  Administered 2014-12-29: 40 mg via INTRAVENOUS

## 2014-12-29 SURGICAL SUPPLY — 29 items
CANISTER SUCT 1200ML W/VALVE (MISCELLANEOUS) ×3 IMPLANT
FCP ESCP3.2XJMB 240X2.8X (MISCELLANEOUS)
FORCEPS BIOP RAD 4 LRG CAP 4 (CUTTING FORCEPS) IMPLANT
FORCEPS BIOP RJ4 240 W/NDL (MISCELLANEOUS)
FORCEPS ESCP3.2XJMB 240X2.8X (MISCELLANEOUS) IMPLANT
GOWN CVR UNV OPN BCK APRN NK (MISCELLANEOUS) ×4 IMPLANT
GOWN ISOL THUMB LOOP REG UNIV (MISCELLANEOUS) ×6
HEMOCLIP INSTINCT (CLIP) IMPLANT
INJECTOR VARIJECT VIN23 (MISCELLANEOUS) IMPLANT
KIT CO2 TUBING (TUBING) IMPLANT
KIT DEFENDO VALVE AND CONN (KITS) IMPLANT
KIT ENDO PROCEDURE OLY (KITS) ×3 IMPLANT
LIGATOR MULTIBAND 6SHOOTER MBL (MISCELLANEOUS) IMPLANT
MARKER SPOT ENDO TATTOO 5ML (MISCELLANEOUS) IMPLANT
PAD GROUND ADULT SPLIT (MISCELLANEOUS) ×1 IMPLANT
SNARE SHORT THROW 13M SML OVAL (MISCELLANEOUS) ×1 IMPLANT
SNARE SHORT THROW 30M LRG OVAL (MISCELLANEOUS) IMPLANT
SNARE SNG USE RND 15MM (INSTRUMENTS) ×1 IMPLANT
SPOT EX ENDOSCOPIC TATTOO (MISCELLANEOUS)
SUCTION POLY TRAP 4CHAMBER (MISCELLANEOUS) IMPLANT
TRAP SUCTION POLY (MISCELLANEOUS) ×1 IMPLANT
TUBING CONN 6MMX3.1M (TUBING)
TUBING SUCTION CONN 0.25 STRL (TUBING) IMPLANT
UNDERPAD 30X60 958B10 (PK) (MISCELLANEOUS) IMPLANT
VALVE BIOPSY ENDO (VALVE) IMPLANT
VARIJECT INJECTOR VIN23 (MISCELLANEOUS)
WATER AUXILLARY (MISCELLANEOUS) IMPLANT
WATER STERILE IRR 250ML POUR (IV SOLUTION) ×3 IMPLANT
WATER STERILE IRR 500ML POUR (IV SOLUTION) IMPLANT

## 2014-12-29 NOTE — Anesthesia Preprocedure Evaluation (Addendum)
Anesthesia Evaluation  Patient identified by MRN, date of birth, ID band Patient awake    Reviewed: Allergy & Precautions, H&P , NPO status , Patient's Chart, lab work & pertinent test results, reviewed documented beta blocker date and time   History of Anesthesia Complications (+) PONV and history of anesthetic complications  Airway Mallampati: II  TM Distance: >3 FB Neck ROM: full    Dental no notable dental hx.    Pulmonary sleep apnea ,    Pulmonary exam normal breath sounds clear to auscultation       Cardiovascular Exercise Tolerance: Good hypertension, + CAD   Rhythm:regular Rate:Normal  Stent placed in 2005.  Has done well with all functional tests since that time.   Neuro/Psych negative neurological ROS  negative psych ROS   GI/Hepatic negative GI ROS, Neg liver ROS,   Endo/Other  Hypothyroidism   Renal/GU negative Renal ROS  negative genitourinary   Musculoskeletal   Abdominal   Peds  Hematology negative hematology ROS (+)   Anesthesia Other Findings   Reproductive/Obstetrics negative OB ROS                            Anesthesia Physical Anesthesia Plan  ASA: II  Anesthesia Plan: MAC   Post-op Pain Management:    Induction: Intravenous  Airway Management Planned: Nasal Cannula  Additional Equipment:   Intra-op Plan:   Post-operative Plan:   Informed Consent: I have reviewed the patients History and Physical, chart, labs and discussed the procedure including the risks, benefits and alternatives for the proposed anesthesia with the patient or authorized representative who has indicated his/her understanding and acceptance.   Dental Advisory Given  Plan Discussed with: CRNA  Anesthesia Plan Comments:        Anesthesia Quick Evaluation

## 2014-12-29 NOTE — Transfer of Care (Signed)
Immediate Anesthesia Transfer of Care Note  Patient: Olivia Dougherty  Procedure(s) Performed: Procedure(s): COLONOSCOPY WITH PROPOFOL (N/A) POLYPECTOMY  Patient Location: PACU  Anesthesia Type: MAC  Level of Consciousness: awake, alert  and patient cooperative  Airway and Oxygen Therapy: Patient Spontanous Breathing and Patient connected to supplemental oxygen  Post-op Assessment: Post-op Vital signs reviewed, Patient's Cardiovascular Status Stable, Respiratory Function Stable, Patent Airway and No signs of Nausea or vomiting  Post-op Vital Signs: Reviewed and stable  Complications: No apparent anesthesia complications

## 2014-12-29 NOTE — Anesthesia Procedure Notes (Signed)
Procedure Name: MAC Performed by: Sheli Dorin Pre-anesthesia Checklist: Patient identified, Emergency Drugs available, Suction available, Timeout performed and Patient being monitored Patient Re-evaluated:Patient Re-evaluated prior to inductionOxygen Delivery Method: Nasal cannula Placement Confirmation: positive ETCO2     

## 2014-12-29 NOTE — H&P (Signed)
[facility]  [address] [place], [place] [address] Phone: [phone] Fax : [phone]  Primary Care Physician:  Perrin Maltese, MD Primary Gastroenterologist:  Dr. Allen Norris  Pre-Procedure History & Physical: HPI:  Olivia Dougherty is a 64 y.o. female is here for a screening colonoscopy.   Past Medical History  Diagnosis Date  . Perimenopausal menorrhagia   . Heart problem     98% blockage  . Hypertension   . Sleep apnea     mild, could not tol cpap  . Hypothyroidism   . Anxiety   . Basal cell carcinoma 11/2011    face, left leg-using chemo cream on temple area  . PONV (postoperative nausea and vomiting)     after 1 procedure approx 10 yrs ago  . Arthritis     lower back  . Coronary artery disease     stent 2005  . GERD (gastroesophageal reflux disease)   . Hypercholesteremia     Past Surgical History  Procedure Laterality Date  . Tubal ligation  1980    BTL  . Umbilical hernia repair  1980  . Vulvar cancer  1993    laser  . Hysteroscopy  11/2001    resection of benign leiomyoma  . Cardiac catheterization  02/2008  . Angioplasty  2005    CAD   98% blockage  . Thyroid cyst excision Left 10/2002    left lobe thyroid excision  . Coronary angioplasty      2005  . Knee arthroscopy Right 08/16/2014    Procedure: ARTHROSCOPY KNEE, partial lateral menisectomy and chondroplasty;  Surgeon: Dereck Leep, MD;  Location: [facility] ORS;  Service: Orthopedics;  Laterality: Right;    Prior to Admission medications   Medication Sig Start Date End Date Taking? Authorizing Provider  ALPRAZolam (XANAX) 0.25 MG tablet Take 0.25 mg by mouth as needed.  01/04/14  Yes Historical Provider, MD  diphenhydramine-acetaminophen (TYLENOL PM) 25-500 MG TABS Take 1 tablet by mouth at bedtime as needed.   Yes Historical Provider, MD  fluticasone (FLONASE) 50 MCG/ACT nasal spray Place 2 sprays into both nostrils daily.   Yes Historical Provider, MD  levothyroxine (SYNTHROID,  LEVOTHROID) 75 MCG tablet Take 75 mcg by mouth daily before breakfast.   Yes Historical Provider, MD  loratadine (CLARITIN) 10 MG tablet Take 10 mg by mouth daily.   Yes Historical Provider, MD  losartan (COZAAR) 50 MG tablet 100 mg daily.  10/19/12  Yes Historical Provider, MD  meloxicam (MOBIC) 7.5 MG tablet Take 7.5 mg by mouth 2 (two) times daily.   Yes Historical Provider, MD  metoprolol tartrate (LOPRESSOR) 25 MG tablet Take 25 mg by mouth 2 (two) times daily.   Yes Historical Provider, MD  Multiple Vitamins-Minerals (MULTIVITAMIN PO) Take by mouth daily.   Yes Historical Provider, MD  omega-3 acid ethyl esters (LOVAZA) 1 G capsule Take by mouth 2 (two) times daily.   Yes Historical Provider, MD  ranitidine (ZANTAC) 150 MG capsule 150 mg 2 (two) times daily.  10/20/12  Yes Historical Provider, MD  aspirin 81 MG tablet Take 81 mg by mouth daily.    Historical Provider, MD  atorvastatin (LIPITOR) 80 MG tablet Take 1 tablet by mouth daily. 11/11/13   Historical Provider, MD  clopidogrel (PLAVIX) 75 MG tablet Take 75 mg by mouth daily with breakfast.    Historical Provider, MD  HYDROcodone-acetaminophen (NORCO) 5-325 MG per tablet Take 1-2 tablets by mouth every 4 (four) hours as needed for moderate  pain. Patient not taking: Reported on 12/04/2014 08/16/14   Dereck Leep, MD    Allergies as of 12/04/2014 - Review Complete 08/16/2014  Allergen Reaction Noted  . Ace inhibitors  12/04/2014  . Adhesive [tape]  12/14/2012  . Atorvastatin  12/04/2014  . Codeine  08/10/2014  . Enalapril  12/04/2014  . Penicillins  12/14/2012  . Sulfa antibiotics Other (See Comments) 12/14/2012    Family History  Problem Relation Age of Onset  . Hypertension Mother   . Hypertension Father   . Diabetes Father   . Heart disease Father     Social History   Social History  . Marital Status: Married    Spouse Name: N/A  . Number of Children: N/A  . Years of Education: N/A   Occupational History  . Not  on file.   Social History Main Topics  . Smoking status: Never Smoker   . Smokeless tobacco: Never Used  . Alcohol Use: 3.6 oz/week    2 Glasses of wine, 4 Standard drinks or equivalent per week  . Drug Use: No  . Sexual Activity:    Partners: Male    Birth Control/ Protection: Surgical     Comment: BTL   Other Topics Concern  . Not on file   Social History Narrative    Review of Systems: See HPI, otherwise negative ROS  Physical Exam: BP 110/81 mmHg  Pulse 67  Temp(Src) 97.9 F (36.6 C) (Temporal)  Resp 16  Ht 5\' 2"  (1.575 m)  Wt 214 lb (97.07 kg)  BMI 39.13 kg/m2  SpO2 99%  LMP 01/28/2007 General:   Alert,  pleasant and cooperative in NAD Head:  Normocephalic and atraumatic. Neck:  Supple; no masses or thyromegaly. Lungs:  Clear throughout to auscultation.    Heart:  Regular rate and rhythm. Abdomen:  Soft, nontender and nondistended. Normal bowel sounds, without guarding, and without rebound.   Neurologic:  Alert and  oriented x4;  grossly normal neurologically.  Impression/Plan: [name] is now here to undergo a screening colonoscopy.  Risks, benefits, and alternatives regarding colonoscopy have been reviewed with the patient.  Questions have been answered.  All parties agreeable.

## 2014-12-29 NOTE — Op Note (Signed)
[facility] Gastroenterology Patient Name: Olivia Dougherty Procedure Date: 12/29/2014 9:03 AM MRN: [id number] Account #: [id number] Date of Birth: Sep 20, 1950 Admit Type: Outpatient Age: 64 Room: [facility] OR ROOM 01 Gender: Female Note Status: Finalized Procedure:         Colonoscopy Indications:       Screening for colorectal malignant neoplasm Providers:         Lucilla Lame, MD Referring MD:      Perrin Maltese, MD (Referring MD) Medicines:         Propofol per Anesthesia Complications:     No immediate complications. Procedure:         Pre-Anesthesia Assessment:                    - Prior to the procedure, a History and Physical was                     performed, and patient medications and allergies were                     reviewed. The patient's tolerance of previous anesthesia                     was also reviewed. The risks and benefits of the procedure                     and the sedation options and risks were discussed with the                     patient. All questions were answered, and informed consent                     was obtained. Prior Anticoagulants: The patient has taken                     no previous anticoagulant or antiplatelet agents. ASA                     Grade Assessment: II - A patient with mild systemic                     disease. After reviewing the risks and benefits, the                     patient was deemed in satisfactory condition to undergo                     the procedure.                    After obtaining informed consent, the colonoscope was                     passed under direct vision. Throughout the procedure, the                     patient's blood pressure, pulse, and oxygen saturations                     were monitored continuously. The Olympus CF H180AL                     colonoscope (S#: [id number]) was introduced through the anus  and advanced to the the cecum, identified by appendiceal           orifice and ileocecal valve. The colonoscopy was performed                     without difficulty. The patient tolerated the procedure                     well. The quality of the bowel preparation was excellent. Findings:      The perianal and digital rectal examinations were normal.      Two sessile polyps were found in the ascending colon. The polyps were 5       to 9 mm in size. These polyps were removed with a hot snare. Resection       and retrieval were complete.      A 4 mm polyp was found in the descending colon. The polyp was sessile.       The polyp was removed with a cold biopsy forceps. Resection and       retrieval were complete.      Multiple small-mouthed diverticula were found in the sigmoid colon.      Non-bleeding internal hemorrhoids were found during retroflexion. The       hemorrhoids were Grade II (internal hemorrhoids that prolapse but reduce       spontaneously). Impression:        - Two 5 to 9 mm polyps in the ascending colon. Resected                     and retrieved.                    - One 4 mm polyp in the descending colon. Resected and                     retrieved.                    - Diverticulosis in the sigmoid colon.                    - Non-bleeding internal hemorrhoids. Recommendation:    - Await pathology results.                    - Repeat colonoscopy in 5 years if polyp adenoma and 10                     years if hyperplastic Procedure Code(s): --- Professional ---                    [phone], Colonoscopy, flexible; with removal of tumor(s),                     polyp(s), or other lesion(s) by snare technique                    45380, 36, Colonoscopy, flexible; with biopsy, single or                     multiple Diagnosis Code(s): --- Professional ---                    Z12.11, Encounter for screening for malignant neoplasm of  colon                    D12.2, Benign neoplasm of ascending colon                     D12.4, Benign neoplasm of descending colon CPT copyright 2014 American Medical Association. All rights reserved. The codes documented in this report are preliminary and upon coder review may  be revised to meet current compliance requirements. Lucilla Lame, MD 12/29/2014 9:26:16 AM This report has been signed electronically. Number of Addenda: 0 Note Initiated On: 12/29/2014 9:03 AM Scope Withdrawal Time: 0 hours 11 minutes 4 seconds  Total Procedure Duration: 0 hours 15 minutes 21 seconds       [facility]

## 2014-12-29 NOTE — Anesthesia Postprocedure Evaluation (Signed)
Anesthesia Post Note  Patient: Olivia Dougherty  Procedure(s) Performed: Procedure(s) (LRB): COLONOSCOPY WITH PROPOFOL (N/A) POLYPECTOMY  Patient location during evaluation: PACU Anesthesia Type: MAC Level of consciousness: awake and alert Pain management: pain level controlled Vital Signs Assessment: post-procedure vital signs reviewed and stable Respiratory status: spontaneous breathing, nonlabored ventilation and respiratory function stable Cardiovascular status: stable and blood pressure returned to baseline Anesthetic complications: no    Trecia Rogers

## 2015-01-01 ENCOUNTER — Encounter: Payer: Self-pay | Admitting: Gastroenterology

## 2015-01-02 ENCOUNTER — Encounter: Payer: Self-pay | Admitting: Gastroenterology

## 2015-01-17 ENCOUNTER — Ambulatory Visit
Admission: RE | Admit: 2015-01-17 | Discharge: 2015-01-17 | Disposition: A | Discharge status: Home / Self Care | Payer: BLUE CROSS/BLUE SHIELD | Source: Ambulatory Visit

## 2015-01-17 ENCOUNTER — Encounter: Payer: Self-pay | Admitting: Nurse Practitioner

## 2015-01-17 ENCOUNTER — Ambulatory Visit (INDEPENDENT_AMBULATORY_CARE_PROVIDER_SITE_OTHER): Payer: BLUE CROSS/BLUE SHIELD | Admitting: Nurse Practitioner

## 2015-01-17 VITALS — BP 120/74 | HR 60 | Ht 62.25 in | Wt 218.0 lb

## 2015-01-17 DIAGNOSIS — R8290 Unspecified abnormal findings in urine: Secondary | ICD-10-CM | POA: Diagnosis not present

## 2015-01-17 DIAGNOSIS — Z0000 Encounter for general adult medical examination without abnormal findings: Secondary | ICD-10-CM

## 2015-01-17 DIAGNOSIS — Z1231 Encounter for screening mammogram for malignant neoplasm of breast: Secondary | ICD-10-CM

## 2015-01-17 DIAGNOSIS — E559 Vitamin D deficiency, unspecified: Secondary | ICD-10-CM

## 2015-01-17 DIAGNOSIS — Z01419 Encounter for gynecological examination (general) (routine) without abnormal findings: Secondary | ICD-10-CM | POA: Diagnosis not present

## 2015-01-17 NOTE — Progress Notes (Signed)
Encounter reviewed Jill Jertson, MD   

## 2015-01-17 NOTE — Patient Instructions (Signed)

## 2015-01-17 NOTE — Progress Notes (Signed)
Patient ID: Olivia Dougherty, female   DOB: [birth year], 64 y.o.   MRN: [id number]  64 y.o. G3P0003 Married  Caucasian Fe here for annual exam.  Recent knee scope 07/2014 with repair of tendon.  No other new problems.  Now on new cholesterol med's and is followed every 6 months. Still followed for cardiac issues.  Patient's last menstrual period was 01/28/2007 (approximate).          Sexually active: Yes.    The current method of family planning is post menopausal status.    Exercising: Yes.    Goes to [facility] twice weekly to use treadmill and recumbant bike.  Walks occasionally. Smoker:  no  Health Maintenance: Pap:01/13/14, Negative  History of abnormal Pap: no MMG:01/17/15, no results at time of visit Colonoscopy:12/29/14, polyp x 3 (tubular adenoma x 1), diverticula, repeat in 3 years BMD: 04/2009, 1.6/0.4, pt thinks she has had updated in last two years TDaP: 2012 Shingles: pt has order from PCP to take to pharmacy Pneumonia: pt has order from PCP to take to pharmacy Hep C and HIV: will have done today Labs: HB:  13.5  Urine: trace leuk's   reports that she has never smoked. She has never used smokeless tobacco. She reports that she drinks about 3.6 oz of alcohol per week. She reports that she does not use illicit drugs.  Past Medical History  Diagnosis Date  . Perimenopausal menorrhagia   . Heart problem     98% blockage  . Hypertension   . Sleep apnea     mild, could not tol cpap  . Hypothyroidism   . Anxiety   . Basal cell carcinoma 11/2011    face, left leg-using chemo cream on temple area  . PONV (postoperative nausea and vomiting)     after 1 procedure approx 10 yrs ago  . Arthritis     lower back  . Coronary artery disease     stent 2005  . GERD (gastroesophageal reflux disease)   . Hypercholesteremia     Past Surgical History  Procedure Laterality Date  . Tubal ligation  1980    BTL  . Umbilical hernia repair  1980  . Vulvar cancer  1993    laser  .  Hysteroscopy  11/2001    resection of benign leiomyoma  . Cardiac catheterization  02/2008  . Angioplasty  2005    CAD   98% blockage  . Thyroid cyst excision Left 10/2002    left lobe thyroid excision  . Coronary angioplasty      2005  . Knee arthroscopy Right 08/16/2014    Procedure: ARTHROSCOPY KNEE, partial lateral menisectomy and chondroplasty;  Surgeon: Dereck Leep, MD;  Location: [facility] ORS;  Service: Orthopedics;  Laterality: Right;  . Colonoscopy with propofol N/A 12/29/2014    Procedure: COLONOSCOPY WITH PROPOFOL;  Surgeon: Lucilla Lame, MD;  Location: [place];  Service: Endoscopy;  Laterality: N/A;  . Polypectomy  12/29/2014    Procedure: POLYPECTOMY;  Surgeon: Lucilla Lame, MD;  Location: [place];  Service: Endoscopy;;    Current Outpatient Prescriptions  Medication Sig Dispense Refill  . ALPRAZolam (XANAX) 0.25 MG tablet Take 0.25 mg by mouth as needed.     Marland Kitchen aspirin 81 MG tablet Take 81 mg by mouth daily.    . clopidogrel (PLAVIX) 75 MG tablet Take 75 mg by mouth daily with breakfast.    . diphenhydramine-acetaminophen (TYLENOL PM) 25-500 MG TABS Take 1 tablet by mouth at bedtime  as needed.    . fluticasone (FLONASE) 50 MCG/ACT nasal spray Place 2 sprays into both nostrils daily.    Marland Kitchen levothyroxine (SYNTHROID, LEVOTHROID) 75 MCG tablet Take 75 mcg by mouth daily before breakfast.    . LIVALO 2 MG TABS Take 1 tablet by mouth daily.    Marland Kitchen loratadine (CLARITIN) 10 MG tablet Take 10 mg by mouth daily.    Marland Kitchen losartan (COZAAR) 50 MG tablet 100 mg daily.     . meloxicam (MOBIC) 7.5 MG tablet Take 7.5 mg by mouth 2 (two) times daily.    . metoprolol tartrate (LOPRESSOR) 25 MG tablet Take 25 mg by mouth 2 (two) times daily.    . Multiple Vitamins-Minerals (MULTIVITAMIN PO) Take by mouth daily.    . ranitidine (ZANTAC) 150 MG capsule 150 mg 2 (two) times daily.      No current facility-administered medications for this visit.    Family History  Problem  Relation Age of Onset  . Hypertension Mother   . Hypertension Father   . Diabetes Father   . Heart disease Father     ROS:  Pertinent items are noted in HPI.  Otherwise, a comprehensive ROS was negative.  Exam:   BP 120/74 mmHg  Pulse 60  Ht 5' 2.25" (1.581 m)  Wt 218 lb (98.884 kg)  BMI 39.56 kg/m2  LMP 01/28/2007 (Approximate) Height: 5' 2.25" (158.1 cm) Ht Readings from Last 3 Encounters:  01/17/15 5' 2.25" (1.581 m)  12/29/14 5\' 2"  (1.575 m)  08/10/14 5\' 3"  (1.6 m)    General appearance: alert, cooperative and appears stated age Head: Normocephalic, without obvious abnormality, atraumatic Neck: no adenopathy, supple, symmetrical, trachea midline and thyroid normal to inspection and palpation Lungs: clear to auscultation bilaterally Breasts: normal appearance, no masses or tenderness Heart: regular rate and rhythm Abdomen: soft, non-tender; no masses,  no organomegaly Extremities: extremities normal, atraumatic, no cyanosis or edema Skin: Skin color, texture, turgor normal. No rashes or lesions Lymph nodes: Cervical, supraclavicular, and axillary nodes normal. No abnormal inguinal nodes palpated Neurologic: Grossly normal   Pelvic: External genitalia:  no lesions              Urethra:  normal appearing urethra with no masses, tenderness or lesions              Bartholin's and Skene's: normal                 Vagina: normal appearing vagina with normal color and discharge, no lesions              Cervix: anteverted              Pap taken: No. Bimanual Exam:  Uterus:  normal size, contour, position, consistency, mobility, non-tender              Adnexa: no mass, fullness, tenderness               Rectovaginal: Confirms               Anus:  normal sphincter tone, no lesions  Chaperone present: no  A:  Well Woman with normal exam  PMP, No HRT  H/O CAD, cath 2/10, see cardiology every 6 months.   H/O BCC/SCC. Goes to dermatologist ever six months  Abnormal urine  asymptomatic   P:   Reviewed health and wellness pertinent to exam  Pap smear as above  Mammogram is due 12/17 if this one is normal  Follow with  urine C&S and labs  Counseled on breast self exam, mammography screening, adequate intake of calcium and vitamin D, diet and exercise return annually or prn  An After Visit Summary was printed and given to the patient.

## 2015-01-18 LAB — URINE CULTURE: Colony Count: 3000

## 2015-01-18 LAB — HEPATITIS C ANTIBODY: HCV AB: NEGATIVE

## 2015-01-18 LAB — VITAMIN D 25 HYDROXY (VIT D DEFICIENCY, FRACTURES): Vit D, 25-Hydroxy: 23 ng/mL — ABNORMAL LOW (ref 30–100)

## 2015-01-18 LAB — HIV ANTIBODY (ROUTINE TESTING W REFLEX): HIV 1&2 Ab, 4th Generation: NONREACTIVE

## 2015-01-19 ENCOUNTER — Telehealth: Payer: Self-pay | Admitting: *Deleted

## 2015-01-19 NOTE — Telephone Encounter (Signed)
-----   Message from [place], [place] sent at 01/19/2015 12:16 PM EST ----- Please let pt know that urine culture was negative and HIV and Hep C was negative as expected.  The Vit D was low at 23 - follow protocol.

## 2015-01-19 NOTE — Telephone Encounter (Signed)
I have attempted to contact this patient by phone with the following results: left message to return call to [place] at [phone] on answering machine (mobile per [facility]). Name verified in [place].  Advised urine culture negative and requested return call from patient on Tuesday to review remaining results.  Advised pt not to worry about other results over weekend.  [phone] (Home)

## 2015-01-23 MED ORDER — VITAMIN D (ERGOCALCIFEROL) 1.25 MG (50000 UNIT) PO CAPS: 50000.0000 [IU] | ORAL_CAPSULE | ORAL | Status: DC

## 2015-01-23 NOTE — Telephone Encounter (Signed)
Pt notified in result note.  Closing encounter. 

## 2015-01-23 NOTE — Telephone Encounter (Signed)
Patient returned Stephanie's call. Contact # for today updated per patient.

## 2015-01-23 NOTE — Addendum Note (Signed)
Addended by: Abelino Derrick C on: 01/23/2015 01:06 PM   Modules accepted: Orders, SmartSet

## 2015-01-30 ENCOUNTER — Ambulatory Visit: Payer: BC Managed Care – PPO | Admitting: Obstetrics & Gynecology

## 2015-03-13 ENCOUNTER — Other Ambulatory Visit: Payer: Self-pay | Admitting: Nurse Practitioner

## 2015-03-13 ENCOUNTER — Other Ambulatory Visit: Payer: Self-pay | Admitting: *Deleted

## 2015-03-13 DIAGNOSIS — E559 Vitamin D deficiency, unspecified: Secondary | ICD-10-CM

## 2015-03-13 NOTE — Telephone Encounter (Signed)
Not sure she needs a new RX.  On 12/2014 - notified that Vit d was low and given # 30 tablets to take 1 time a week.  She needed recheck Vit d in 3 months from then.

## 2015-03-13 NOTE — Telephone Encounter (Signed)
Medication refill request: Vitamin D Last AEX:  01-17-15  Next AEX: 02-14-16 Last MMG (if hormonal medication request): 01-18-15 WNL Refill authorized: please advise

## 2015-03-13 NOTE — Telephone Encounter (Signed)
I spoke with patient and she said that the pharmacy only gave her 4 tablets. She also said that she needs an order for the Vit D so can have it drawn in [place] instead of driving all the way the [place].

## 2015-03-14 MED ORDER — VITAMIN D (ERGOCALCIFEROL) 1.25 MG (50000 UNIT) PO CAPS: 50000.0000 [IU] | ORAL_CAPSULE | ORAL | Status: DC

## 2015-03-14 NOTE — Telephone Encounter (Signed)
So if pharmacy gave her only 4 tablets she has other refills left at the pharmacy.  The order will written on RX pad does she want it mailed or come and pick up?

## 2015-06-14 ENCOUNTER — Telehealth: Payer: Self-pay | Admitting: *Deleted

## 2015-06-14 DIAGNOSIS — E559 Vitamin D deficiency, unspecified: Secondary | ICD-10-CM

## 2015-06-14 NOTE — Telephone Encounter (Signed)
Patient agreeable to repeat Vit D.  Patient states she has written order to take to [facility] and she will do this this week.   Will hold call until results received.

## 2015-06-14 NOTE — Telephone Encounter (Signed)
-----   Message from Kem Boroughs, [place] sent at 05/04/2015 10:20 AM EDT ----- Please call pt about reschedule of Vit D  ----- Message -----    From: SYSTEM    Sent: 05/03/2015  12:05 AM      To: Kem Boroughs, FNP

## 2015-06-19 MED ORDER — VITAMIN D (ERGOCALCIFEROL) 1.25 MG (50000 UNIT) PO CAPS: 50000.0000 [IU] | ORAL_CAPSULE | ORAL | Status: DC

## 2015-06-19 NOTE — Telephone Encounter (Signed)
Vit D results are back from [facility]. Her previous Vit d was 29 and now with RX Vit D up to 31.5.  She needs to continue Vit D RX every other week during the summer and back to weekly in late fall and winter.  Will recheck at [facility] 01/2016.

## 2015-06-19 NOTE — Telephone Encounter (Signed)
Notified patient of Vit D results and recommendations.  Pt is agreeable with plan.  She will switch back to weekly dosing in mid-late October.  Refill for Vit D sent to [facility] per patient request.  Routing to provider for final review.  Closing encounter.

## 2015-06-27 ENCOUNTER — Encounter: Payer: Self-pay | Admitting: Nurse Practitioner

## 2015-06-28 ENCOUNTER — Other Ambulatory Visit: Payer: Self-pay | Admitting: Nurse Practitioner

## 2015-12-06 ENCOUNTER — Other Ambulatory Visit: Payer: Self-pay | Admitting: Obstetrics & Gynecology

## 2015-12-06 DIAGNOSIS — Z1231 Encounter for screening mammogram for malignant neoplasm of breast: Secondary | ICD-10-CM

## 2016-02-04 ENCOUNTER — Ambulatory Visit
Admission: RE | Admit: 2016-02-04 | Discharge: 2016-02-04 | Disposition: A | Discharge status: Home / Self Care | Payer: BLUE CROSS/BLUE SHIELD | Source: Ambulatory Visit | Attending: Obstetrics & Gynecology | Admitting: Obstetrics & Gynecology

## 2016-02-04 ENCOUNTER — Encounter: Payer: Self-pay | Admitting: Nurse Practitioner

## 2016-02-04 ENCOUNTER — Ambulatory Visit (INDEPENDENT_AMBULATORY_CARE_PROVIDER_SITE_OTHER): Payer: BLUE CROSS/BLUE SHIELD | Admitting: Nurse Practitioner

## 2016-02-04 VITALS — BP 112/72 | HR 56 | Ht 63.0 in | Wt 217.0 lb

## 2016-02-04 DIAGNOSIS — E559 Vitamin D deficiency, unspecified: Secondary | ICD-10-CM

## 2016-02-04 DIAGNOSIS — Z01411 Encounter for gynecological examination (general) (routine) with abnormal findings: Secondary | ICD-10-CM

## 2016-02-04 DIAGNOSIS — C519 Malignant neoplasm of vulva, unspecified: Secondary | ICD-10-CM

## 2016-02-04 DIAGNOSIS — Z0000 Encounter for general adult medical examination without abnormal findings: Secondary | ICD-10-CM | POA: Diagnosis not present

## 2016-02-04 DIAGNOSIS — Z1231 Encounter for screening mammogram for malignant neoplasm of breast: Secondary | ICD-10-CM

## 2016-02-04 MED ORDER — VITAMIN D (ERGOCALCIFEROL) 1.25 MG (50000 UNIT) PO CAPS: 50000.0000 [IU] | ORAL_CAPSULE | ORAL | 2 refills | Status: DC

## 2016-02-04 NOTE — Progress Notes (Signed)
Patient ID: Olivia Dougherty, female   DOB: [birth year], 66 y.o.   MRN: [id number]  66 y.o. [id number] Married  Caucasian Fe here for annual exam.  About 2 months ago had a left lower quadrant pain for 2 weeks and now gone. She thinks it was related to left hip pain at same time.  Did not have any bowel changes or signs of blood or mucous.  She sees cardiologist every 6 months and last time had CT of heart which was normal, along with echo and carotid US. She feels well.  Patient's last menstrual period was 01/28/2007 (approximate).          Sexually active: Yes.    The current method of family planning is post menopausal status.    Exercising: Not at this time. Smoker:  no  Health Maintenance: Pap:01/13/14, Negative  History of abnormal Pap: no MMG:01/17/15, Bi-Rads: Negative; scheduled for later today Colonoscopy:12/29/14, polyp x 3 (tubular adenoma x 1), diverticula, repeat in 3 years BMD:04/2009, 1.6/0.4, follows with PCP at their office TDaP:10/28/10 Shingles: Never Pneumonia: Not indicated due to age  Hep C and HIV: 01/17/15 Labs: HB: 13.9  Urine: PCP   reports that she has never smoked. She has never used smokeless tobacco. She reports that she drinks about 3.6 oz of alcohol per week . She reports that she does not use drugs.  Past Medical History:  Diagnosis Date  . Anxiety   . Arthritis    lower back  . Basal cell carcinoma 11/2011   face, left leg-using chemo cream on temple area  . Coronary artery disease    stent 2005  . GERD (gastroesophageal reflux disease)   . Heart problem    98% blockage  . Hypercholesteremia   . Hypertension   . Hypothyroidism   . Perimenopausal menorrhagia   . PONV (postoperative nausea and vomiting)    after 1 procedure approx 10 yrs ago  . Sleep apnea    mild, could not tol cpap    Past Surgical History:  Procedure Laterality Date  . ANGIOPLASTY  2005   CAD   98% blockage  . CARDIAC CATHETERIZATION  02/2008  . COLONOSCOPY WITH  PROPOFOL N/A 12/29/2014   Procedure: COLONOSCOPY WITH PROPOFOL;  Surgeon: Lucilla Lame, MD;  Location: [facility];  Service: Endoscopy;  Laterality: N/A;  . CORONARY ANGIOPLASTY     2005  . HYSTEROSCOPY  11/2001   resection of benign leiomyoma  . KNEE ARTHROSCOPY Right 08/16/2014   Procedure: ARTHROSCOPY KNEE, partial lateral menisectomy and chondroplasty;  Surgeon: Dereck Leep, MD;  Location: [facility];  Service: Orthopedics;  Laterality: Right;  . POLYPECTOMY  12/29/2014   Procedure: POLYPECTOMY;  Surgeon: Lucilla Lame, MD;  Location: [facility];  Service: Endoscopy;;  . THYROID CYST EXCISION Left 10/2002   left lobe thyroid excision  . TUBAL LIGATION  1980   BTL  . UMBILICAL HERNIA REPAIR  1980  . vulvar cancer  1993   laser    Current Outpatient Prescriptions  Medication Sig Dispense Refill  . ALPRAZolam (XANAX) 0.25 MG tablet Take 0.25 mg by mouth as needed.     Marland Kitchen aspirin 81 MG tablet Take 81 mg by mouth daily.    . clopidogrel (PLAVIX) 75 MG tablet Take 75 mg by mouth daily with breakfast.    . diphenhydramine-acetaminophen (TYLENOL PM) 25-500 MG TABS Take 1 tablet by mouth at bedtime as needed.    . fluticasone (FLONASE) 50 MCG/ACT nasal spray Place 2  sprays into both nostrils daily.    Marland Kitchen levothyroxine (SYNTHROID, LEVOTHROID) 75 MCG tablet Take 75 mcg by mouth daily before breakfast.    . LIVALO 2 MG TABS Take 1 tablet by mouth daily.    Marland Kitchen loratadine (CLARITIN) 10 MG tablet Take 10 mg by mouth daily.    Marland Kitchen losartan (COZAAR) 50 MG tablet 100 mg daily.     . meloxicam (MOBIC) 7.5 MG tablet Take 7.5 mg by mouth 2 (two) times daily.    . metoprolol tartrate (LOPRESSOR) 25 MG tablet Take 25 mg by mouth 2 (two) times daily.    . Multiple Vitamins-Minerals (MULTIVITAMIN PO) Take by mouth daily.    . ranitidine (ZANTAC) 150 MG capsule 150 mg 2 (two) times daily.     . Vitamin D, Ergocalciferol, (DRISDOL) 50000 units CAPS capsule Take 1 capsule (50,000 Units total) by  mouth every 7 (seven) days. 12 capsule 2   No current facility-administered medications for this visit.     Family History  Problem Relation Age of Onset  . Hypertension Mother   . Hypertension Father   . Diabetes Father   . Heart disease Father     ROS:  Pertinent items are noted in HPI.  Otherwise, a comprehensive ROS was negative.  Exam:   LMP 01/28/2007 (Approximate)    Ht Readings from Last 3 Encounters:  01/17/15 5' 2.25" (1.581 m)  12/29/14 5\' 2"  (1.575 m)  08/10/14 5\' 3"  (1.6 m)    General appearance: alert, cooperative and appears stated age Head: Normocephalic, without obvious abnormality, atraumatic Neck: no adenopathy, supple, symmetrical, trachea midline and thyroid normal to inspection and palpation Lungs: clear to auscultation bilaterally Breasts: normal appearance, no masses or tenderness Heart: regular rate and rhythm Abdomen: soft, non-tender; no masses,  no organomegaly, no pain to deep palpation.  No mass. Extremities: extremities normal, atraumatic, no cyanosis or edema Skin: Skin color, texture, turgor normal. No rashes or lesions Lymph nodes: Cervical, supraclavicular, and axillary nodes normal. No abnormal inguinal nodes palpated Neurologic: Grossly normal   Pelvic: External genitalia:  no lesions or signs of irregular tissue              Urethra:  normal appearing urethra with no masses, tenderness or lesions              Bartholin's and Skene's: normal                 Vagina: normal appearing vagina with normal color and discharge, no lesions              Cervix: anteverted              Pap taken: Yes.  including side wall colection Bimanual Exam:  Uterus:  normal size, contour, position, consistency, mobility, non-tender              Adnexa: no mass, fullness, tenderness               Rectovaginal: Confirms               Anus:  normal sphincter tone, no lesions  Chaperone present: yes  A:  Well Woman with normal exam  Postmenopausal, No  HRT             H/O CAD, cath 2/10, see cardiology every 6 months.              H/O BCC/SCC. Goes to dermatologist ever six months  S/P vulvar cancer treated with laser  1993             History  of Vit D deficiency - on replacement therapy   P:   Reviewed health and wellness pertinent to exam  Pap smear was done  Mammogram is today  Will report if any LLQ pain returns  Will refill Vit D and follow with labs  Counseled on breast self exam, mammography screening, adequate intake of calcium and vitamin D, diet and exercise, Kegel's exercises return annually or prn  An After Visit Summary was printed and given to the patient.

## 2016-02-04 NOTE — Patient Instructions (Signed)

## 2016-02-05 ENCOUNTER — Telehealth: Payer: Self-pay | Admitting: *Deleted

## 2016-02-05 LAB — VITAMIN D 25 HYDROXY (VIT D DEFICIENCY, FRACTURES): VIT D 25 HYDROXY: 37 ng/mL (ref 30–100)

## 2016-02-05 LAB — IPS PAP TEST WITH HPV

## 2016-02-05 NOTE — Telephone Encounter (Signed)
I have attempted to contact this patient by phone with the following results: left message to return call to [place] at [phone] on answering machine (mobile).  No personal information given. [phone] (Mobile)

## 2016-02-05 NOTE — Telephone Encounter (Signed)
-----   Message from Kem Boroughs, [place] sent at 02/05/2016  8:10 AM EST ----- Please let pt know that Vit D is much better at 37 compared to 23 last year.  She may now go to RX Vit D every other week.  Plans are to recheck again next year.

## 2016-02-05 NOTE — Telephone Encounter (Signed)
Pt notified in result note.  Closing encounter. 

## 2016-02-06 ENCOUNTER — Telehealth: Payer: Self-pay | Admitting: *Deleted

## 2016-02-06 NOTE — Telephone Encounter (Signed)
I have attempted to contact this patient by phone with the following results: left message to return call to [place] at [phone] on answering machine (mobile).  No personal information given.  [phone] (Mobile)

## 2016-02-06 NOTE — Telephone Encounter (Signed)
Patient returned call.  She requests a call back at her home number: [phone].

## 2016-02-06 NOTE — Telephone Encounter (Signed)
-----   Message from Kem Boroughs, [place] sent at 02/05/2016  4:23 PM EST ----- Please let pt know that pap was normal - 02 recall.  She does have yeast on pap and can have Diflucan X 2 to her pharmacy of choice.

## 2016-02-07 MED ORDER — FLUCONAZOLE 150 MG PO TABS: 150.0000 mg | ORAL_TABLET | Freq: Once | ORAL | 0 refills | Status: AC

## 2016-02-07 NOTE — Telephone Encounter (Signed)
Pt notified in result note.  Closing encounter. 

## 2016-02-10 NOTE — Progress Notes (Signed)
Encounter reviewed by Dr. Quartez Lagos Amundson C. Silva.  

## 2016-12-31 ENCOUNTER — Other Ambulatory Visit: Payer: Self-pay | Admitting: Obstetrics & Gynecology

## 2016-12-31 DIAGNOSIS — Z1231 Encounter for screening mammogram for malignant neoplasm of breast: Secondary | ICD-10-CM

## 2017-02-04 ENCOUNTER — Ambulatory Visit: Payer: BLUE CROSS/BLUE SHIELD | Admitting: Nurse Practitioner

## 2017-02-06 ENCOUNTER — Encounter: Payer: Self-pay | Admitting: Obstetrics & Gynecology

## 2017-02-06 ENCOUNTER — Ambulatory Visit (INDEPENDENT_AMBULATORY_CARE_PROVIDER_SITE_OTHER): Payer: BLUE CROSS/BLUE SHIELD | Admitting: Obstetrics & Gynecology

## 2017-02-06 ENCOUNTER — Ambulatory Visit
Admission: RE | Admit: 2017-02-06 | Discharge: 2017-02-06 | Disposition: A | Discharge status: Home / Self Care | Payer: BLUE CROSS/BLUE SHIELD | Source: Ambulatory Visit | Attending: Obstetrics & Gynecology | Admitting: Obstetrics & Gynecology

## 2017-02-06 ENCOUNTER — Other Ambulatory Visit: Payer: Self-pay

## 2017-02-06 ENCOUNTER — Telehealth: Payer: Self-pay | Admitting: Obstetrics & Gynecology

## 2017-02-06 VITALS — BP 120/64 | HR 60 | Resp 20 | Ht 62.25 in | Wt 219.0 lb

## 2017-02-06 DIAGNOSIS — Z01411 Encounter for gynecological examination (general) (routine) with abnormal findings: Secondary | ICD-10-CM

## 2017-02-06 DIAGNOSIS — Z955 Presence of coronary angioplasty implant and graft: Secondary | ICD-10-CM

## 2017-02-06 DIAGNOSIS — Z1231 Encounter for screening mammogram for malignant neoplasm of breast: Secondary | ICD-10-CM

## 2017-02-06 NOTE — Telephone Encounter (Signed)
Patient is supposed to be referred to "Dr.Cooper". The patient called Dr.Cooper's office in [place] and was told that he is not taking new patients. Patient is asking Dr.Miller to refer her to a different doctor.

## 2017-02-06 NOTE — Progress Notes (Signed)
67 y.o. [id number] MarriedCaucasianF here for annual exam.  Doing well.  No vaginal bleeding.    Had blood work was in November with Dr. Humphrey Rolls.  Pt reports everything was good.    Sees Dr. Laurelyn Sickle husband, cardiologist in [facility].  Has questions about anti-coagulation and testing that is done every year.  Not comfortable with answered.  Would like another opinion.  Patient's last menstrual period was 01/28/2007 (approximate).          Sexually active: Yes.    The current method of family planning is post menopausal status.    Exercising: Yes.    treadmill and recumbant bike Smoker:  no  Health Maintenance: Pap:  02/04/16 Neg. HR HPV:neg, 01-13-14  Neg History of abnormal Pap:  no MMG:  02/04/16 BIRADS1:neg Colonoscopy:  12/29/14 polyps--due 12/2017 BMD:  04/2009, 1.6/0.4, follows with PCP at their office TDaP: 10-28-10 10/28/10 Pneumonia vaccine(s):  no Shingrix:   no Hep C testing: 01/17/15 Neg  Screening Labs: PCP, Hb today: PCP, Urine today: not done   reports that  has never smoked. she has never used smokeless tobacco. She reports that she drinks about 3.6 oz of alcohol per week. She reports that she does not use drugs.  Past Medical History:  Diagnosis Date  . Anxiety   . Arthritis    lower back  . Basal cell carcinoma 11/2011   face, left leg-using chemo cream on temple area  . Coronary artery disease    stent 2005  . GERD (gastroesophageal reflux disease)   . Heart problem    98% blockage  . Hypercholesteremia   . Hypertension   . Hypothyroidism   . Osteoarthritis   . Perimenopausal menorrhagia   . PONV (postoperative nausea and vomiting)    after 1 procedure approx 10 yrs ago  . Sleep apnea    mild, could not tol cpap    Past Surgical History:  Procedure Laterality Date  . ANGIOPLASTY  2005   CAD   98% blockage  . CARDIAC CATHETERIZATION  02/2008  . COLONOSCOPY WITH PROPOFOL N/A 12/29/2014   Procedure: COLONOSCOPY WITH PROPOFOL;  Surgeon: Lucilla Lame, MD;   Location: [facility];  Service: Endoscopy;  Laterality: N/A;  . CORONARY ANGIOPLASTY     2005  . HYSTEROSCOPY  11/2001   resection of benign leiomyoma  . KNEE ARTHROSCOPY Right 08/16/2014   Procedure: ARTHROSCOPY KNEE, partial lateral menisectomy and chondroplasty;  Surgeon: Dereck Leep, MD;  Location: [facility];  Service: Orthopedics;  Laterality: Right;  . POLYPECTOMY  12/29/2014   Procedure: POLYPECTOMY;  Surgeon: Lucilla Lame, MD;  Location: [facility];  Service: Endoscopy;;  . THYROID CYST EXCISION Left 10/2002   left lobe thyroid excision  . TUBAL LIGATION  1980   BTL  . UMBILICAL HERNIA REPAIR  1980  . vulvar cancer  1993   laser    Current Outpatient Medications  Medication Sig Dispense Refill  . ALPRAZolam (XANAX) 0.25 MG tablet Take 0.25 mg by mouth as needed.     Marland Kitchen aspirin 81 MG tablet Take 81 mg by mouth daily.    . clopidogrel (PLAVIX) 75 MG tablet Take 75 mg by mouth daily with breakfast.    . diphenhydramine-acetaminophen (TYLENOL PM) 25-500 MG TABS Take 1 tablet by mouth at bedtime as needed.    . fluticasone (FLONASE) 50 MCG/ACT nasal spray Place 2 sprays into both nostrils daily.    Marland Kitchen levothyroxine (SYNTHROID, LEVOTHROID) 75 MCG tablet Take 75 mcg by  mouth daily before breakfast.    . LIVALO 2 MG TABS Take 1 tablet by mouth daily.    Marland Kitchen loratadine (CLARITIN) 10 MG tablet Take 10 mg by mouth daily.    . meloxicam (MOBIC) 7.5 MG tablet Take 7.5 mg by mouth 2 (two) times daily.    . metoprolol tartrate (LOPRESSOR) 25 MG tablet Take 25 mg by mouth 2 (two) times daily.    . Multiple Vitamins-Minerals (MULTIVITAMIN PO) Take by mouth daily.    . ranitidine (ZANTAC) 150 MG capsule 150 mg 2 (two) times daily.     . Vitamin D, Ergocalciferol, (DRISDOL) 50000 units CAPS capsule Take 1 capsule by mouth every 14 (fourteen) days.  1   No current facility-administered medications for this visit.     Family History  Problem Relation Age of Onset  .  Hypertension Mother   . Hypertension Father   . Diabetes Father   . Heart disease Father     ROS:  Pertinent items are noted in HPI.  Otherwise, a comprehensive ROS was negative.  Exam:   BP 120/64 (BP Location: Right Arm, Patient Position: Sitting, Cuff Size: Large)   Pulse 60   Resp 20   Ht 5' 2.25" (1.581 m)   Wt 219 lb (99.3 kg)   LMP 01/28/2007 (Approximate)   BMI 39.73 kg/m    Height: 5' 2.25" (158.1 cm)  Ht Readings from Last 3 Encounters:  02/06/17 5' 2.25" (1.581 m)  02/04/16 5\' 3"  (1.6 m)  01/17/15 5' 2.25" (1.581 m)    General appearance: alert, cooperative and appears stated age Head: Normocephalic, without obvious abnormality, atraumatic Neck: no adenopathy, supple, symmetrical, trachea midline and thyroid normal to inspection and palpation Lungs: clear to auscultation bilaterally Breasts: normal appearance, no masses or tenderness Heart: regular rate and rhythm Abdomen: soft, non-tender; bowel sounds normal; no masses,  no organomegaly Extremities: extremities normal, atraumatic, no cyanosis or edema Skin: Skin color, texture, turgor normal. No rashes or lesions Lymph nodes: Cervical, supraclavicular, and axillary nodes normal. No abnormal inguinal nodes palpated Neurologic: Grossly normal   Pelvic: External genitalia:  no lesions              Urethra:  normal appearing urethra with no masses, tenderness or lesions              Bartholins and Skenes: normal                 Vagina: normal appearing vagina with normal color and discharge, no lesions              Cervix: no lesions              Pap taken: No. Bimanual Exam:  Uterus:  normal size, contour, position, consistency, mobility, non-tender              Adnexa: normal adnexa and no mass, fullness, tenderness               Rectovaginal: Confirms               Anus:  normal sphincter tone, no lesions  Chaperone was present for exam.  A:  Well Woman with normal exam PMP, no HRT H/o CAD, cath 2/10.   Followed by Dr Humphrey Rolls in [place].  Having yearly stress tests and yearly echocardiogram.  On Plavix.  Questions about long term use. H/O BCC/SCC.   H/O vulvar dysplasia, 1993  P:   Mammogram guidelines reviewed.  Doing 3D MMG. pap  smear with neg HR HPV neg 2018.  No pap smear obtained today. Referral to cardiologist for second opinion Order for Shingrix vaccine given today Release of lab work from Dr. Humphrey Rolls will be signed today. Will need to do rx for pt for Vit D after we get results. Return annually or prn

## 2017-02-09 NOTE — Telephone Encounter (Signed)
Routing to [place] for review and advised of new referral recommendation.

## 2017-02-09 NOTE — Telephone Encounter (Signed)
I placed a cardiology referral for her on Friday.  It indicated it has been cancelled.  Can you please get a little more information about this for me.  I referred her to the [place] office in [place] for a second opinion.  Thanks.

## 2017-02-10 NOTE — Telephone Encounter (Signed)
Left message to call Jonda Alanis at [phone]. 

## 2017-02-10 NOTE — Telephone Encounter (Signed)
Spoke with patient. Patient states that [place] placed a referral to [facility] and it turns out Dr.Cooper only practices in [place]. Patient was scheduled with Dr.Arida in [place] instead. Patient then called [facility] office and was advised Dr.Cooper is not taking new patients. Patient had second thoughts about her appointment with Dr.Arida so she cancelled. Wants Dr.Miller's opinion before proceeding with another MD.

## 2017-02-11 NOTE — Telephone Encounter (Signed)
Spoke with patient. Advised of massage as seen below from [place]. Patient is agreeable and will call to Dr.Arida's office to reschedule appointment. Declines assistance with scheduling as she has many days coming up that will not work well for her for appointments and wants to speak with the directly. Encounter closed.

## 2017-02-11 NOTE — Telephone Encounter (Signed)
Dr. Fletcher Anon would be excellent.  He does interventional cardiology--so places stents--and will know the most up to date recommendations about anticoagulation and follow up studies.  I think she should see him.

## 2017-02-19 ENCOUNTER — Other Ambulatory Visit: Payer: Self-pay | Admitting: *Deleted

## 2017-02-19 ENCOUNTER — Other Ambulatory Visit: Payer: Self-pay | Admitting: Obstetrics & Gynecology

## 2017-02-19 DIAGNOSIS — E559 Vitamin D deficiency, unspecified: Secondary | ICD-10-CM

## 2017-02-19 NOTE — Progress Notes (Signed)
Vit d order placed for pt to do at [facility].

## 2017-02-21 LAB — VITAMIN D 25 HYDROXY (VIT D DEFICIENCY, FRACTURES): VIT D 25 HYDROXY: 32.5 ng/mL (ref 30.0–100.0)

## 2017-02-23 ENCOUNTER — Telehealth: Payer: Self-pay | Admitting: Obstetrics & Gynecology

## 2017-02-23 NOTE — Telephone Encounter (Signed)
See previous phone encounters. Patient has called and rescheduled her appointment with Dr Fletcher Anon with [facility]. Appointment is scheduled 04/07/17 at 2:00 PM.  Forwarding to Dr Sabra Heck for review

## 2017-02-26 ENCOUNTER — Other Ambulatory Visit: Payer: Self-pay | Admitting: Obstetrics & Gynecology

## 2017-02-26 MED ORDER — VITAMIN D (ERGOCALCIFEROL) 1.25 MG (50000 UNIT) PO CAPS: 50000.0000 [IU] | ORAL_CAPSULE | ORAL | 4 refills | Status: DC

## 2017-03-12 ENCOUNTER — Ambulatory Visit: Payer: BLUE CROSS/BLUE SHIELD | Admitting: Cardiovascular Disease

## 2017-04-07 ENCOUNTER — Encounter: Payer: Self-pay | Admitting: Cardiovascular Disease

## 2017-04-07 ENCOUNTER — Ambulatory Visit (INDEPENDENT_AMBULATORY_CARE_PROVIDER_SITE_OTHER): Payer: BLUE CROSS/BLUE SHIELD | Admitting: Cardiovascular Disease

## 2017-04-07 VITALS — BP 140/70 | HR 80 | Ht 62.0 in | Wt 214.2 lb

## 2017-04-07 DIAGNOSIS — Z7689 Persons encountering health services in other specified circumstances: Secondary | ICD-10-CM

## 2017-04-07 DIAGNOSIS — I2510 Atherosclerotic heart disease of native coronary artery without angina pectoris: Secondary | ICD-10-CM | POA: Diagnosis not present

## 2017-04-07 DIAGNOSIS — E782 Mixed hyperlipidemia: Secondary | ICD-10-CM

## 2017-04-07 MED ORDER — ROSUVASTATIN CALCIUM 10 MG PO TABS: 10.0000 mg | ORAL_TABLET | Freq: Every day | ORAL | 3 refills | Status: DC

## 2017-04-07 NOTE — Progress Notes (Signed)
Cardiology Office Note   Date:  04/07/2017   ID:  Olivia Dougherty, DOB Nov 04, 1950, MRN [id number]  PCP:  Perrin Maltese, MD  Cardiologist:   Kathlyn Sacramento, MD   Chief Complaint  Patient presents with  . other    Ref by Dr. Hale Bogus (GYN) for 2nd opinion for CAD; s/p stent placement to the LAD in 12/29/2003 at [facility] by Dr. Rollene Fare. The patient had been followed by Dr. Stark Klein since 2005. Meds reviewed by the pt. verbally. Denies chest pain or shortness of breath.       History of Present Illness: Olivia Dougherty is a 67 y.o. female who presents to establish cardiovascular care.  She has known history of coronary artery disease and was previously followed by Dr. Humphrey Rolls.  She underwent LAD PCI and Cypher drug-eluting stent placement with balloon angioplasty of diagonal in 2005 by Dr. Rollene Fare.  No cardiac events since then.  I did perform cardiac catheterization on her in 2010 which showed patent stent with minimal restenosis.  She has been on dual antiplatelet therapy since then.  She has known history of hyperlipidemia with intolerance to most statins due to myalgia.  She is currently on Livalo. She has been having yearly cardiac workup with Dr. Humphrey Rolls including stress testing and echocardiograms.  She had CTA of the coronary arteries in 2017 which showed minimal plaque with no obstructive disease and patent LAD stent.    Past Medical History:  Diagnosis Date  . Anxiety   . Arthritis    lower back  . Basal cell carcinoma 11/2011   face, left leg-using chemo cream on temple area  . Coronary artery disease    stent 2005  . GERD (gastroesophageal reflux disease)   . Heart problem    98% blockage  . Hypercholesteremia   . Hypertension   . Hypothyroidism   . Osteoarthritis   . Perimenopausal menorrhagia   . PONV (postoperative nausea and vomiting)    after 1 procedure approx 10 yrs ago  . Sleep apnea    mild, could not tol cpap    Past Surgical History:    Procedure Laterality Date  . ANGIOPLASTY  2005   CAD   98% blockage  . CARDIAC CATHETERIZATION  02/2008  . COLONOSCOPY WITH PROPOFOL N/A 12/29/2014   Procedure: COLONOSCOPY WITH PROPOFOL;  Surgeon: Lucilla Lame, MD;  Location: [place];  Service: Endoscopy;  Laterality: N/A;  . CORONARY ANGIOPLASTY  2005   LAD  . HYSTEROSCOPY  11/2001   resection of benign leiomyoma  . KNEE ARTHROSCOPY Right 08/16/2014   Procedure: ARTHROSCOPY KNEE, partial lateral menisectomy and chondroplasty;  Surgeon: Dereck Leep, MD;  Location: [facility];  Service: Orthopedics;  Laterality: Right;  . POLYPECTOMY  12/29/2014   Procedure: POLYPECTOMY;  Surgeon: Lucilla Lame, MD;  Location: [place];  Service: Endoscopy;;  . THYROID CYST EXCISION Left 10/2002   left lobe thyroid excision  . TUBAL LIGATION  1980   BTL  . UMBILICAL HERNIA REPAIR  1980  . vulvar cancer  1993   laser     Current Outpatient Medications  Medication Sig Dispense Refill  . aspirin 81 MG tablet Take 81 mg by mouth daily.    . clopidogrel (PLAVIX) 75 MG tablet Take 75 mg by mouth daily with breakfast.    . fluticasone (FLONASE) 50 MCG/ACT nasal spray Place 2 sprays into both nostrils daily.    Marland Kitchen levothyroxine (SYNTHROID, LEVOTHROID) 75 MCG  tablet Take 75 mcg by mouth daily before breakfast.    . loratadine (CLARITIN) 10 MG tablet Take 10 mg by mouth daily.    . meloxicam (MOBIC) 7.5 MG tablet Take 7.5 mg by mouth 2 (two) times daily.    . metoprolol tartrate (LOPRESSOR) 25 MG tablet Take 25 mg by mouth 2 (two) times daily.    . Multiple Vitamins-Minerals (MULTIVITAMIN PO) Take by mouth daily.    . Pitavastatin Calcium (LIVALO) 4 MG TABS Take 4 mg by mouth daily.    . ranitidine (ZANTAC) 150 MG capsule 150 mg 2 (two) times daily.     . Vitamin D, Ergocalciferol, (DRISDOL) 50000 units CAPS capsule Take 1 capsule (50,000 Units total) by mouth every 14 (fourteen) days. 6 capsule 4   No current facility-administered  medications for this visit.     Allergies:   Atorvastatin; Codeine; Ace inhibitors; Enalapril; Penicillins; Sulfa antibiotics; and Adhesive [tape]    Social History:  The patient  reports that  has never smoked. she has never used smokeless tobacco. She reports that she drinks about 3.6 oz of alcohol per week. She reports that she does not use drugs.   Family History:  The patient's family history includes Diabetes in her father; Heart disease in her father; Hypertension in her father and mother.    ROS:  Please see the history of present illness.   Otherwise, review of systems are positive for none.   All other systems are reviewed and negative.    PHYSICAL EXAM: VS:  BP 140/70 (BP Location: Right Arm, Patient Position: Sitting, Cuff Size: Normal)   Pulse 80   Ht 5\' 2"  (1.575 m)   Wt 214 lb 4 oz (97.2 kg)   LMP 01/28/2007 (Approximate)   BMI 39.19 kg/m  , BMI Body mass index is 39.19 kg/m. GEN: Well nourished, well developed, in no acute distress  HEENT: normal  Neck: no JVD, carotid bruits, or masses Cardiac: RRR; no  rubs, or gallops,no edema .  1 out of 6 systolic ejection murmur in the aortic area Respiratory:  clear to auscultation bilaterally, normal work of breathing GI: soft, nontender, nondistended, + BS MS: no deformity or atrophy  Skin: warm and dry, no rash Neuro:  Strength and sensation are intact Psych: euthymic mood, full affect   EKG:  EKG is ordered today. The ekg ordered today demonstrates normal sinus rhythm with no significant ST or T wave changes.   Recent Labs: No results found for requested labs within last 8760 hours.    Lipid Panel No results found for: CHOL, TRIG, HDL, CHOLHDL, VLDL, LDLCALC, LDLDIRECT    Wt Readings from Last 3 Encounters:  04/07/17 214 lb 4 oz (97.2 kg)  02/06/17 219 lb (99.3 kg)  02/04/16 217 lb (98.4 kg)      Other studies Reviewed: Additional studies/ records that were reviewed today include: Previous cardiac  workup. Review of the above records demonstrates: Summarized above  No flowsheet data found.    ASSESSMENT AND PLAN:  1.  Coronary artery disease involving native coronary arteries without angina: Fortunately, she is not having any anginal symptoms.  Given that she had first generation drug-eluting stent to the LAD, I favor continued lifelong dual antiplatelet therapy as tolerated.  2.  Hyperlipidemia with intolerance to statins.  Currently on Livalo.  I reviewed most recent lipid profile which showed an LDL of 101 with triglyceride of 148.  I discussed with her different management options and she reports that  her myalgia were mostly with higher dose statins.  Thus, I elected to switch her to rosuvastatin 10 mg daily.  If LDL remains above 70, I plan on adding Zetia and if not able to achieve an LDL below 70 after that, I recommend treatment with a PC SK 9 inhibitor.  3.  Essential hypertension: Blood pressure is controlled on current medications.    Disposition:   FU with me in 6 months  Signed,  Kathlyn Sacramento, MD  04/07/2017 2:08 PM    [facility]

## 2017-04-07 NOTE — Patient Instructions (Signed)
Medication Instructions:  Your physician has recommended you make the following change in your medication:  STOP taking livalo START taking rosuvastatin 10mg  once daily    Labwork: Fasting liver and lipid profile in 6 weeks at the [facility] lab. No appointment needed. Nothing to eat or drink after midnight the evening before your labs.   Testing/Procedures: none  Follow-Up: Your physician wants you to follow-up in: 6 months with Dr. Fletcher Anon.  You will receive a reminder letter in the mail two months in advance. If you don't receive a letter, please call our office to schedule the follow-up appointment.  Any Other Special Instructions Will Be Listed Below (If Applicable).     If you need a refill on your cardiac medications before your next appointment, please call your pharmacy.

## 2017-05-21 ENCOUNTER — Other Ambulatory Visit
Admission: RE | Admit: 2017-05-21 | Discharge: 2017-05-21 | Disposition: A | Discharge status: Home / Self Care | Payer: BLUE CROSS/BLUE SHIELD | Source: Ambulatory Visit | Attending: Cardiovascular Disease | Admitting: Cardiovascular Disease

## 2017-05-21 DIAGNOSIS — E782 Mixed hyperlipidemia: Secondary | ICD-10-CM | POA: Insufficient documentation

## 2017-05-21 LAB — HEPATIC FUNCTION PANEL
ALBUMIN: 4.1 g/dL (ref 3.5–5.0)
ALT: 18 U/L (ref 14–54)
AST: 21 U/L (ref 15–41)
Alkaline Phosphatase: 81 U/L (ref 38–126)
Bilirubin, Direct: <0.1 mg/dL — ABNORMAL LOW (ref 0.1–0.5)
Total Bilirubin: 1.1 mg/dL (ref 0.3–1.2)
Total Protein: 7.1 g/dL (ref 6.5–8.1)

## 2017-05-21 LAB — LIPID PANEL
CHOLESTEROL: 158 mg/dL (ref 0–200)
HDL: 57 mg/dL (ref >40)
LDL CALC: 79 mg/dL (ref 0–99)
TRIGLYCERIDES: 110 mg/dL (ref <150)
Total CHOL/HDL Ratio: 2.8 RATIO
VLDL: 22 mg/dL (ref 0–40)

## 2017-07-01 ENCOUNTER — Ambulatory Visit: Payer: BLUE CROSS/BLUE SHIELD | Admitting: Family Medicine

## 2017-08-10 ENCOUNTER — Ambulatory Visit (INDEPENDENT_AMBULATORY_CARE_PROVIDER_SITE_OTHER): Payer: Medicare Other | Admitting: Family Medicine

## 2017-08-10 ENCOUNTER — Encounter: Payer: Self-pay | Admitting: Family Medicine

## 2017-08-10 VITALS — BP 138/86 | HR 60 | Temp 98.1°F | Resp 16 | Ht 62.0 in | Wt 220.0 lb

## 2017-08-10 DIAGNOSIS — J309 Allergic rhinitis, unspecified: Secondary | ICD-10-CM | POA: Insufficient documentation

## 2017-08-10 DIAGNOSIS — E7800 Pure hypercholesterolemia, unspecified: Secondary | ICD-10-CM

## 2017-08-10 DIAGNOSIS — Z23 Encounter for immunization: Secondary | ICD-10-CM

## 2017-08-10 DIAGNOSIS — E785 Hyperlipidemia, unspecified: Secondary | ICD-10-CM | POA: Insufficient documentation

## 2017-08-10 DIAGNOSIS — K219 Gastro-esophageal reflux disease without esophagitis: Secondary | ICD-10-CM

## 2017-08-10 DIAGNOSIS — F411 Generalized anxiety disorder: Secondary | ICD-10-CM | POA: Diagnosis not present

## 2017-08-10 DIAGNOSIS — I10 Essential (primary) hypertension: Secondary | ICD-10-CM | POA: Diagnosis not present

## 2017-08-10 DIAGNOSIS — M159 Polyosteoarthritis, unspecified: Secondary | ICD-10-CM

## 2017-08-10 DIAGNOSIS — G4733 Obstructive sleep apnea (adult) (pediatric): Secondary | ICD-10-CM | POA: Diagnosis not present

## 2017-08-10 DIAGNOSIS — E039 Hypothyroidism, unspecified: Secondary | ICD-10-CM | POA: Diagnosis not present

## 2017-08-10 DIAGNOSIS — J301 Allergic rhinitis due to pollen: Secondary | ICD-10-CM | POA: Diagnosis not present

## 2017-08-10 DIAGNOSIS — I2510 Atherosclerotic heart disease of native coronary artery without angina pectoris: Secondary | ICD-10-CM | POA: Diagnosis not present

## 2017-08-10 DIAGNOSIS — M150 Primary generalized (osteo)arthritis: Secondary | ICD-10-CM | POA: Diagnosis not present

## 2017-08-10 DIAGNOSIS — Z85828 Personal history of other malignant neoplasm of skin: Secondary | ICD-10-CM

## 2017-08-10 NOTE — Progress Notes (Signed)
Patient: Olivia Dougherty, Female    DOB: October 11, 1950, 67 y.o.   MRN: [id number] Visit Date: 08/12/2017  Today's Provider: [name], MD   I, Martha Clan, CMA, am acting as scribe for [name], MD.  Chief Complaint  Patient presents with  . New Patient (Initial Visit)   Subjective:    Establish Care Olivia Dougherty is a 67 y.o. female who presents today as a new patient to establish care. She feels well. She reports exercising none due to arthritic pain. She reports she is sleeping fairly well.  Her previous PCP's office was [facility].   Arthritis: Patient reports arthritis changes and intermittent pain and stiffness of her bilateral hips, hands, knees, low back.  She states this was really exacerbated last year and her previous PCP gave her prednisone and cortisone shots.  The cortisone shots only lasted for a few weeks.  She has been unable to exercise or walk regularly for the last 6 months.  She was previously using the recumbent bike which seemed to help.  Meloxicam did not seem to help and she is worried about taking it chronically as it upsets her stomach.  She is wondering if she needs a rheumatologist.  She has had an orthopedist operated on her knee before.  OSA - mild, unable to tolerate CPAP, was told that she didn't have to because it was mild.  Husband doesn't complain of any snoring or apnea.   Hypothyroidism: s/p L partial thyroidectomy for nodules. Taking Synthroid 34mcg daily with good compliance.  Reports that TSH has been well controlled each time that it was checked.  Allergic rhinitis: Good symptoms control with claritin, flonase  HLD, CAD, HTN - s/p stent placement in May 21, 2003 for 98% blockage of LAD by Dr Fletcher Anon - was being followed by Dr Humphrey Rolls and then switched to Dr Fletcher Anon - medications: Plavix, ASA, losartan 100mg  daily, Metoprolol 25mg  BID, Crestor 10mg  daily - compliance: good - Denies any SOB, CP, vision changes, LE edema, medication SEs, or  symptoms of hypotension  H/o basal cell carcinoma: Followed by Dermatology (Dr Kellie Moor) annually for skin checks with no recent recurrence.  Anxiety: Tried Cymbalta for a few days and felt nauseated. Previously was on Alprazolam prn in 05-21-2011 after son's death - hasnt had any in ~9 months. Not currently on any medication for this.  Not currently in therapy.  GERD: Taking ranitidine BID with good relief of symptoms.  Also tries to avoid foods that are triggers.  No h/o PUD or GI bleed. -----------------------------------------------------------------   Review of Systems  Constitutional: Positive for fatigue. Negative for activity change, appetite change, chills, diaphoresis, fever and unexpected weight change.  HENT: Positive for hearing loss. Negative for congestion, dental problem, drooling, ear discharge, ear pain, facial swelling, mouth sores, nosebleeds, postnasal drip, rhinorrhea, sinus pressure, sinus pain, sneezing, sore throat, tinnitus, trouble swallowing and voice change.   Eyes: Negative.   Respiratory: Negative.   Cardiovascular: Negative.   Gastrointestinal: Negative.   Endocrine: Negative.   Genitourinary: Negative.   Musculoskeletal: Positive for arthralgias and back pain. Negative for gait problem, joint swelling, myalgias, neck pain and neck stiffness.  Skin: Negative.   Allergic/Immunologic: Negative.   Neurological: Negative.   Hematological: Negative for adenopathy. Bruises/bleeds easily.  Psychiatric/Behavioral: Negative.     Social History      She  reports that she has never smoked. She has never used smokeless tobacco. She reports that she drank alcohol. She reports that she  does not use drugs.       Social History   Socioeconomic History  . Marital status: Married    Spouse name: Edd Arbour  . Number of children: 3  . Years of education: 29  . Highest education level: High school graduate  Occupational History  . Not on file  Social Needs  . Financial  resource strain: Not on file  . Food insecurity:    Worry: Not on file    Inability: Not on file  . Transportation needs:    Medical: Not on file    Non-medical: Not on file  Tobacco Use  . Smoking status: Never Smoker  . Smokeless tobacco: Never Used  Substance and Sexual Activity  . Alcohol use: Not Currently  . Drug use: No  . Sexual activity: Yes    Partners: Male    Birth control/protection: Surgical  Lifestyle  . Physical activity:    Days per week: Not on file    Minutes per session: Not on file  . Stress: Not on file  Relationships  . Social connections:    Talks on phone: Not on file    Gets together: Not on file    Attends religious service: Not on file    Active member of club or organization: Not on file    Attends meetings of clubs or organizations: Not on file    Relationship status: Not on file  Other Topics Concern  . Not on file  Social History Narrative   2 living children; one son deceased from overdose.    Past Medical History:  Diagnosis Date  . Anxiety   . Arthritis    lower back  . Basal cell carcinoma 11/2011   face, left leg-using chemo cream on temple area  . Coronary artery disease 2005   LAD PCI with Cypher drug-eluting stent placement 3.0 x 13 mm with balloon angioplasty of diagonal.  Cardiac catheterization in 2010 showed patent stent with minimal restenosis.  Marland Kitchen GERD (gastroesophageal reflux disease)   . Heart disease   . Heart problem    98% blockage  . Hypercholesteremia   . Hypertension   . Hypothyroidism   . Osteoarthritis   . PONV (postoperative nausea and vomiting)    after 1 procedure approx 10 yrs ago  . Sleep apnea    mild, could not tol cpap     Patient Active Problem List   Diagnosis Date Noted  . Essential hypertension 08/10/2017  . Hyperlipidemia 08/10/2017  . OSA (obstructive sleep apnea) 08/10/2017  . Allergic rhinitis 08/10/2017  . GAD (generalized anxiety disorder) 08/10/2017  . GERD (gastroesophageal  reflux disease) 08/10/2017  . Special screening for malignant neoplasms, colon   . Benign neoplasm of ascending colon   . Benign neoplasm of descending colon   . Current tear knee, medial meniscus 05/25/2014  . Arthritis of knee, degenerative 05/25/2014  . Squamous cell carcinoma 01/13/2014  . History of basal cell carcinoma 01/13/2014  . Coronary atherosclerosis of native coronary artery 01/13/2014  . Osteoarthritis 07/29/2011  . Acquired hypothyroidism 01/01/2006    Past Surgical History:  Procedure Laterality Date  . ANGIOPLASTY  2005   CAD   98% blockage  . CARDIAC CATHETERIZATION  02/2008  . COLONOSCOPY WITH PROPOFOL N/A 12/29/2014   Procedure: COLONOSCOPY WITH PROPOFOL;  Surgeon: Lucilla Lame, MD;  Location: [place];  Service: Endoscopy;  Laterality: N/A;  . CORONARY ANGIOPLASTY  2005   LAD  . HYSTEROSCOPY  11/2001   resection  of benign leiomyoma  . KNEE ARTHROSCOPY Right 08/16/2014   Procedure: ARTHROSCOPY KNEE, partial lateral menisectomy and chondroplasty;  Surgeon: Dereck Leep, MD;  Location: [facility];  Service: Orthopedics;  Laterality: Right;  . POLYPECTOMY  12/29/2014   Procedure: POLYPECTOMY;  Surgeon: Lucilla Lame, MD;  Location: [place];  Service: Endoscopy;;  . THYROID CYST EXCISION Left 10/2002   left lobe thyroid excision  . THYROIDECTOMY, PARTIAL Left   . TUBAL LIGATION  1980   BTL  . UMBILICAL HERNIA REPAIR  1980  . vulvar cancer  1993   laser    Family History        Family Status  Relation Name Status  . Mother  Deceased  . Father  Deceased  . Sister  Deceased  . Neg Hx  (Not Specified)        Her family history includes Cervical cancer in her sister; Diabetes in her father; Heart disease in her father; Hypertension in her father and mother; Stroke in her sister. There is no history of Colon cancer, Breast cancer, or Ovarian cancer.      Allergies  Allergen Reactions  . Atorvastatin Other (See Comments)    Other  reaction(s): myalgia  . Codeine Shortness Of Breath  . Ace Inhibitors Cough       . Enalapril Cough  . Penicillins Hives       . Sulfa Antibiotics Hives and Swelling       . Adhesive [Tape] Rash          Current Outpatient Medications:  .  aspirin 81 MG tablet, Take 81 mg by mouth daily., Disp: , Rfl:  .  clopidogrel (PLAVIX) 75 MG tablet, Take 75 mg by mouth daily with breakfast., Disp: , Rfl:  .  levothyroxine (SYNTHROID, LEVOTHROID) 75 MCG tablet, Take 75 mcg by mouth daily before breakfast., Disp: , Rfl:  .  losartan (COZAAR) 100 MG tablet, Take 100 mg by mouth daily., Disp: , Rfl:  .  meloxicam (MOBIC) 7.5 MG tablet, Take 7.5 mg by mouth 2 (two) times daily., Disp: , Rfl:  .  metoprolol tartrate (LOPRESSOR) 25 MG tablet, Take 25 mg by mouth 2 (two) times daily., Disp: , Rfl:  .  Multiple Vitamins-Minerals (MULTIVITAMIN PO), Take by mouth daily., Disp: , Rfl:  .  ranitidine (ZANTAC) 150 MG capsule, 150 mg 2 (two) times daily. , Disp: , Rfl:  .  rosuvastatin (CRESTOR) 10 MG tablet, Take 1 tablet (10 mg total) by mouth daily., Disp: 90 tablet, Rfl: 3 .  Vitamin D, Ergocalciferol, (DRISDOL) 50000 units CAPS capsule, Take 1 capsule (50,000 Units total) by mouth every 14 (fourteen) days., Disp: 6 capsule, Rfl: 4 .  fluticasone (FLONASE) 50 MCG/ACT nasal spray, Place 2 sprays into both nostrils daily., Disp: , Rfl:  .  loratadine (CLARITIN) 10 MG tablet, Take 10 mg by mouth daily., Disp: , Rfl:    Patient Care Team: Virginia Crews, MD as PCP - General (Family Medicine)      Objective:   Vitals: BP 138/86 (BP Location: Left Arm, Patient Position: Sitting, Cuff Size: Large)   Pulse 60   Temp 98.1 F (36.7 C) (Oral)   Resp 16   Ht 5\' 2"  (1.575 m)   Wt 220 lb (99.8 kg)   LMP 01/28/2007 (Approximate)   SpO2 98%   BMI 40.24 kg/m    Vitals:   08/10/17 1402  BP: 138/86  Pulse: 60  Resp: 16  Temp: 98.1 F (36.7  C)  TempSrc: Oral  SpO2: 98%  Weight: 220 lb (99.8 kg)    Height: 5\' 2"  (1.575 m)     Physical Exam  Constitutional: She is oriented to person, place, and time. She appears well-developed and well-nourished. No distress.  HENT:  Head: Normocephalic and atraumatic.  Right Ear: External ear normal.  Left Ear: External ear normal.  Nose: Nose normal.  Mouth/Throat: Oropharynx is clear and moist.  Eyes: Pupils are equal, round, and reactive to light. Conjunctivae and EOM are normal. No scleral icterus.  Neck: Neck supple. No thyromegaly present.  Cardiovascular: Normal rate, regular rhythm, normal heart sounds and intact distal pulses.  No murmur heard. Pulmonary/Chest: Effort normal and breath sounds normal. No respiratory distress. She has no wheezes. She has no rales.  Abdominal: Soft. Bowel sounds are normal. She exhibits no distension. There is no tenderness. There is no rebound and no guarding.  Musculoskeletal: She exhibits no edema or deformity.  Lymphadenopathy:    She has no cervical adenopathy.  Neurological: She is alert and oriented to person, place, and time.  Skin: Skin is warm and dry. Capillary refill takes less than 2 seconds. No rash noted.  Psychiatric: She has a normal mood and affect. Her behavior is normal.  Vitals reviewed.    Depression Screen PHQ 2/9 Scores 08/10/2017  PHQ - 2 Score 0     Assessment & Plan:    Problem List Items Addressed This Visit      Cardiovascular and Mediastinum   Coronary atherosclerosis of native coronary artery - Primary    Status post angioplasty with stent placement in 2005 Followed regularly by cardiology Continue dual antiplatelet therapy      Essential hypertension    Well-controlled today Reviewed recent labs Continue metoprolol and losartan at current doses        Respiratory   OSA (obstructive sleep apnea)    Reportedly mild Not using CPAP Seems to have restorative sleep Will review records from previous PCP including sleep study      Allergic rhinitis     Well-controlled Continue Claritin and Flonase        Digestive   GERD (gastroesophageal reflux disease)    Well-controlled Continue ranitidine twice daily Meloxicam may exacerbate this and increased risk of gastritis when use chronically        Endocrine   Acquired hypothyroidism    Patient believes she recently had TSH drawn at previous PCP office We will review these records prior to ordering a new one Continue Synthroid at current dose        Musculoskeletal and Integument   History of basal cell carcinoma    Continue to be followed by dermatology regularly Encourage sunscreen use      Osteoarthritis    Chronic, involving multiple joints Discussed with patient that she does not need to see rheumatology for this Discussed importance of symptom management with NSAIDs or Tylenol, exercise, weight loss No indication for surgical intervention of any of her joints at this time        Other   Hyperlipidemia    Reviewed recent lipid panel Followed by cardiology Continue Crestor 10 mg daily      GAD (generalized anxiety disorder)    Chronic and stable Not on any medications Could consider SSRI in the future Would avoid chronic benzodiazepines       Other Visit Diagnoses    Need for vaccination with 13-polyvalent pneumococcal conjugate vaccine       Relevant  Orders   Pneumococcal conjugate vaccine 13-valent IM (Completed)       Return in about 3 months (around 11/10/2017) for chronic disease f/u and ~6 weeks for Welcome to Medicare visit.  Addressed extensive list of chronic and acute medical problems today requiring extensive time in counseling and coordination of care.  Over half of this 45 minute visit were spent in counseling and coordinating care of multiple medical problems.    ROI sent to previous PCP  The entirety of the information documented in the History of Present Illness, Review of Systems and Physical Exam were personally obtained by me.  Portions of this information were initially documented by Raquel Sarna Ratchford, CMA and reviewed by me for thoroughness and accuracy.    Virginia Crews, MD, MPH [facility] 08/12/2017 1:13 PM

## 2017-08-12 NOTE — Assessment & Plan Note (Signed)
Chronic, involving multiple joints Discussed with patient that she does not need to see rheumatology for this Discussed importance of symptom management with NSAIDs or Tylenol, exercise, weight loss No indication for surgical intervention of any of her joints at this time

## 2017-08-12 NOTE — Assessment & Plan Note (Signed)
Status post angioplasty with stent placement in 2005 Followed regularly by cardiology Continue dual antiplatelet therapy

## 2017-08-12 NOTE — Assessment & Plan Note (Signed)
Continue to be followed by dermatology regularly Encourage sunscreen use

## 2017-08-12 NOTE — Assessment & Plan Note (Signed)
Well-controlled today Reviewed recent labs Continue metoprolol and losartan at current doses

## 2017-08-12 NOTE — Assessment & Plan Note (Signed)
Well-controlled Continue ranitidine twice daily Meloxicam may exacerbate this and increased risk of gastritis when use chronically

## 2017-08-12 NOTE — Assessment & Plan Note (Signed)
Chronic and stable Not on any medications Could consider SSRI in the future Would avoid chronic benzodiazepines

## 2017-08-12 NOTE — Assessment & Plan Note (Signed)
Well-controlled Continue Claritin and Flonase

## 2017-08-12 NOTE — Assessment & Plan Note (Signed)
Reviewed recent lipid panel Followed by cardiology Continue Crestor 10 mg daily

## 2017-08-12 NOTE — Assessment & Plan Note (Signed)
Patient believes she recently had TSH drawn at previous PCP office We will review these records prior to ordering a new one Continue Synthroid at current dose

## 2017-08-12 NOTE — Assessment & Plan Note (Signed)
Reportedly mild Not using CPAP Seems to have restorative sleep Will review records from previous PCP including sleep study

## 2017-09-16 ENCOUNTER — Other Ambulatory Visit: Payer: Self-pay | Admitting: Family Medicine

## 2017-09-16 MED ORDER — LEVOTHYROXINE SODIUM 75 MCG PO TABS: 75.0000 ug | ORAL_TABLET | Freq: Every day | ORAL | 2 refills | Status: DC

## 2017-09-16 NOTE — Telephone Encounter (Signed)
Pt came by the office for refill request on the following medications:  levothyroxine (SYNTHROID, LEVOTHROID) 75 MCG tablet   [employer]  Pt stated that when she came for her new pt appt on 08/10/17 she discussed with Dr. B taking over this medication. Please advise. Thanks TNP

## 2017-09-18 DIAGNOSIS — D485 Neoplasm of uncertain behavior of skin: Secondary | ICD-10-CM | POA: Diagnosis not present

## 2017-09-18 DIAGNOSIS — D0462 Carcinoma in situ of skin of left upper limb, including shoulder: Secondary | ICD-10-CM | POA: Diagnosis not present

## 2017-09-18 DIAGNOSIS — Z08 Encounter for follow-up examination after completed treatment for malignant neoplasm: Secondary | ICD-10-CM | POA: Diagnosis not present

## 2017-09-18 DIAGNOSIS — Z872 Personal history of diseases of the skin and subcutaneous tissue: Secondary | ICD-10-CM | POA: Diagnosis not present

## 2017-09-18 DIAGNOSIS — X32XXXA Exposure to sunlight, initial encounter: Secondary | ICD-10-CM | POA: Diagnosis not present

## 2017-09-18 DIAGNOSIS — L570 Actinic keratosis: Secondary | ICD-10-CM | POA: Diagnosis not present

## 2017-09-18 DIAGNOSIS — Z85828 Personal history of other malignant neoplasm of skin: Secondary | ICD-10-CM | POA: Diagnosis not present

## 2017-09-21 ENCOUNTER — Ambulatory Visit (INDEPENDENT_AMBULATORY_CARE_PROVIDER_SITE_OTHER): Payer: Medicare Other | Admitting: Family Medicine

## 2017-09-21 ENCOUNTER — Encounter: Payer: Self-pay | Admitting: Family Medicine

## 2017-09-21 VITALS — BP 144/68 | HR 59 | Temp 98.1°F | Ht 62.0 in | Wt 219.0 lb

## 2017-09-21 DIAGNOSIS — Z0000 Encounter for general adult medical examination without abnormal findings: Secondary | ICD-10-CM

## 2017-09-21 DIAGNOSIS — E2839 Other primary ovarian failure: Secondary | ICD-10-CM

## 2017-09-21 NOTE — Patient Instructions (Addendum)
Someone will call to schedule a bone density test  Call your Medicare Supplement and ask if they cover a physical in addition to the Annual Wellness Visit   Get the second Shingrix shot - in late September or later  Get your flu shot this fall  Next Colonoscopy is due in 12/2017    Preventive Care 65 Years and Older, Female Preventive care refers to lifestyle choices and visits with your health care provider that can promote health and wellness. What does preventive care include?  A yearly physical exam. This is also called an annual well check.  Dental exams once or twice a year.  Routine eye exams. Ask your health care provider how often you should have your eyes checked.  Personal lifestyle choices, including: ? Daily care of your teeth and gums. ? Regular physical activity. ? Eating a healthy diet. ? Avoiding tobacco and drug use. ? Limiting alcohol use. ? Practicing safe sex. ? Taking low-dose aspirin every day. ? Taking vitamin and mineral supplements as recommended by your health care provider. What happens during an annual well check? The services and screenings done by your health care provider during your annual well check will depend on your age, overall health, lifestyle risk factors, and family history of disease. Counseling Your health care provider may ask you questions about your:  Alcohol use.  Tobacco use.  Drug use.  Emotional well-being.  Home and relationship well-being.  Sexual activity.  Eating habits.  History of falls.  Memory and ability to understand (cognition).  Work and work Statistician.  Reproductive health.  Screening You may have the following tests or measurements:  Height, weight, and BMI.  Blood pressure.  Lipid and cholesterol levels. These may be checked every 5 years, or more frequently if you are over 12 years old.  Skin check.  Lung cancer screening. You may have this screening every year starting at age 70  if you have a 30-pack-year history of smoking and currently smoke or have quit within the past 15 years.  Fecal occult blood test (FOBT) of the stool. You may have this test every year starting at age 23.  Flexible sigmoidoscopy or colonoscopy. You may have a sigmoidoscopy every 5 years or a colonoscopy every 10 years starting at age 40.  Hepatitis C blood test.  Hepatitis B blood test.  Sexually transmitted disease (STD) testing.  Diabetes screening. This is done by checking your blood sugar (glucose) after you have not eaten for a while (fasting). You may have this done every 1-3 years.  Bone density scan. This is done to screen for osteoporosis. You may have this done starting at age 84.  Mammogram. This may be done every 1-2 years. Talk to your health care provider about how often you should have regular mammograms.  Talk with your health care provider about your test results, treatment options, and if necessary, the need for more tests. Vaccines Your health care provider may recommend certain vaccines, such as:  Influenza vaccine. This is recommended every year.  Tetanus, diphtheria, and acellular pertussis (Tdap, Td) vaccine. You may need a Td booster every 10 years.  Varicella vaccine. You may need this if you have not been vaccinated.  Zoster vaccine. You may need this after age 56.  Measles, mumps, and rubella (MMR) vaccine. You may need at least one dose of MMR if you were born in 1957 or later. You may also need a second dose.  Pneumococcal 13-valent conjugate (PCV13) vaccine. One  dose is recommended after age 16.  Pneumococcal polysaccharide (PPSV23) vaccine. One dose is recommended after age 56.  Meningococcal vaccine. You may need this if you have certain conditions.  Hepatitis A vaccine. You may need this if you have certain conditions or if you travel or work in places where you may be exposed to hepatitis A.  Hepatitis B vaccine. You may need this if you have  certain conditions or if you travel or work in places where you may be exposed to hepatitis B.  Haemophilus influenzae type b (Hib) vaccine. You may need this if you have certain conditions.  Talk to your health care provider about which screenings and vaccines you need and how often you need them. This information is not intended to replace advice given to you by your health care provider. Make sure you discuss any questions you have with your health care provider. Document Released: 02/09/2015 Document Revised: 10/03/2015 Document Reviewed: 11/14/2014 Elsevier Interactive Patient Education  [employer].

## 2017-09-21 NOTE — Progress Notes (Signed)
Patient: Olivia Dougherty, Female    DOB: [birth year], 67 y.o.   MRN: [id number] Visit Date: 09/21/2017  Today's Provider: [name], MD   Chief Complaint  Patient presents with  . Welcome to Medicare   Subjective:  I, Tiburcio Pea, CMA, am acting as a scribe for [name], MD.   Initial preventative physical exam Olivia Dougherty is a 67 y.o. female who presents today for her Initial Preventative Physical Exam. She feels fairly well. She reports exercising 1-2 days per week. She reports she is sleeping fair.  Pap smear 02/04/2016- performed by gynecology- normal and HPV negative Mammogram 02/06/2017-BI-RADS 1 Colonoscopy 12/29/2014-hyperplastic polyps, recommended repeat in 3 years Needs DEXA   Review of Systems  Constitutional: Negative.   HENT: Negative.   Eyes: Negative.   Respiratory: Negative.   Cardiovascular: Negative.   Gastrointestinal: Negative.   Endocrine: Negative.   Genitourinary: Negative.   Musculoskeletal: Negative.   Skin: Negative.   Allergic/Immunologic: Negative.   Neurological: Negative.   Hematological: Negative.     Social History   Socioeconomic History  . Marital status: Married    Spouse name: Edd Arbour  . Number of children: 3  . Years of education: 50  . Highest education level: High school graduate  Occupational History  . Not on file  Social Needs  . Financial resource strain: Not on file  . Food insecurity:    Worry: Not on file    Inability: Not on file  . Transportation needs:    Medical: Not on file    Non-medical: Not on file  Tobacco Use  . Smoking status: Never Smoker  . Smokeless tobacco: Never Used  Substance and Sexual Activity  . Alcohol use: Not Currently  . Drug use: No  . Sexual activity: Yes    Partners: Male    Birth control/protection: Surgical  Lifestyle  . Physical activity:    Days per week: Not on file    Minutes per session: Not on file  . Stress: Not on file  Relationships  . Social  connections:    Talks on phone: Not on file    Gets together: Not on file    Attends religious service: Not on file    Active member of club or organization: Not on file    Attends meetings of clubs or organizations: Not on file    Relationship status: Not on file  . Intimate partner violence:    Fear of current or ex partner: Not on file    Emotionally abused: Not on file    Physically abused: Not on file    Forced sexual activity: Not on file  Other Topics Concern  . Not on file  Social History Narrative   2 living children; one son deceased from overdose.    Past Medical History:  Diagnosis Date  . Anxiety   . Arthritis    lower back  . Basal cell carcinoma 11/2011   face, left leg-using chemo cream on temple area  . Coronary artery disease 2005   LAD PCI with Cypher drug-eluting stent placement 3.0 x 13 mm with balloon angioplasty of diagonal.  Cardiac catheterization in 2010 showed patent stent with minimal restenosis.  Marland Kitchen GERD (gastroesophageal reflux disease)   . Heart disease   . Heart problem    98% blockage  . Hypercholesteremia   . Hypertension   . Hypothyroidism   . Osteoarthritis   . PONV (postoperative nausea and vomiting)  after 1 procedure approx 10 yrs ago  . Sleep apnea    mild, could not tol cpap     Patient Active Problem List   Diagnosis Date Noted  . Morbid obesity ([place]) 09/21/2017  . Essential hypertension 08/10/2017  . Hyperlipidemia 08/10/2017  . OSA (obstructive sleep apnea) 08/10/2017  . Allergic rhinitis 08/10/2017  . GAD (generalized anxiety disorder) 08/10/2017  . GERD (gastroesophageal reflux disease) 08/10/2017  . Special screening for malignant neoplasms, colon   . Benign neoplasm of ascending colon   . Benign neoplasm of descending colon   . Current tear knee, medial meniscus 05/25/2014  . Arthritis of knee, degenerative 05/25/2014  . Squamous cell carcinoma 01/13/2014  . History of basal cell carcinoma 01/13/2014  .  Osteoarthritis 07/29/2011  . Coronary atherosclerosis of native coronary artery 01/01/2006  . Acquired hypothyroidism 01/01/2006    Past Surgical History:  Procedure Laterality Date  . ANGIOPLASTY  2005   CAD   98% blockage  . CARDIAC CATHETERIZATION  02/2008  . COLONOSCOPY WITH PROPOFOL N/A 12/29/2014   Procedure: COLONOSCOPY WITH PROPOFOL;  Surgeon: Lucilla Lame, MD;  Location: [facility];  Service: Endoscopy;  Laterality: N/A;  . CORONARY ANGIOPLASTY  2005   LAD  . HYSTEROSCOPY  11/2001   resection of benign leiomyoma  . KNEE ARTHROSCOPY Right 08/16/2014   Procedure: ARTHROSCOPY KNEE, partial lateral menisectomy and chondroplasty;  Surgeon: Dereck Leep, MD;  Location: [facility];  Service: Orthopedics;  Laterality: Right;  . POLYPECTOMY  12/29/2014   Procedure: POLYPECTOMY;  Surgeon: Lucilla Lame, MD;  Location: [facility];  Service: Endoscopy;;  . THYROID CYST EXCISION Left 10/2002   left lobe thyroid excision  . THYROIDECTOMY, PARTIAL Left   . TUBAL LIGATION  1980   BTL  . UMBILICAL HERNIA REPAIR  1980  . vulvar cancer  1993   laser    Her family history includes Cervical cancer in her sister; Diabetes in her father; Heart disease in her father; Hypertension in her father and mother; Stroke in her sister. There is no history of Colon cancer, Breast cancer, or Ovarian cancer.      Current Outpatient Medications:  .  aspirin 81 MG tablet, Take 81 mg by mouth daily., Disp: , Rfl:  .  clopidogrel (PLAVIX) 75 MG tablet, Take 75 mg by mouth daily with breakfast., Disp: , Rfl:  .  fluticasone (FLONASE) 50 MCG/ACT nasal spray, Place 2 sprays into both nostrils daily., Disp: , Rfl:  .  levothyroxine (SYNTHROID, LEVOTHROID) 75 MCG tablet, Take 1 tablet (75 mcg total) by mouth daily before breakfast., Disp: 30 tablet, Rfl: 2 .  loratadine (CLARITIN) 10 MG tablet, Take 10 mg by mouth daily., Disp: , Rfl:  .  losartan (COZAAR) 100 MG tablet, Take 100 mg by mouth daily.,  Disp: , Rfl:  .  meloxicam (MOBIC) 7.5 MG tablet, Take 7.5 mg by mouth 2 (two) times daily., Disp: , Rfl:  .  metoprolol tartrate (LOPRESSOR) 25 MG tablet, Take 25 mg by mouth 2 (two) times daily., Disp: , Rfl:  .  Multiple Vitamins-Minerals (MULTIVITAMIN PO), Take by mouth daily., Disp: , Rfl:  .  ranitidine (ZANTAC) 150 MG capsule, 150 mg 2 (two) times daily. , Disp: , Rfl:  .  Vitamin D, Ergocalciferol, (DRISDOL) 50000 units CAPS capsule, Take 1 capsule (50,000 Units total) by mouth every 14 (fourteen) days., Disp: 6 capsule, Rfl: 4 .  rosuvastatin (CRESTOR) 10 MG tablet, Take 1 tablet (10 mg total) by  mouth daily., Disp: 90 tablet, Rfl: 3   Patient Care Team: Virginia Crews, MD as PCP - General (Family Medicine)      Objective:   Vitals: BP (!) 144/68 (BP Location: Right Arm, Patient Position: Sitting, Cuff Size: Large)   Pulse (!) 59   Temp 98.1 F (36.7 C) (Oral)   Ht 5\' 2"  (1.575 m)   Wt 219 lb (99.3 kg)   LMP 01/28/2007 (Approximate)   SpO2 97%   BMI 40.06 kg/m   Physical Exam  Constitutional: She is oriented to person, place, and time. She appears well-developed and well-nourished. No distress.  HENT:  Head: Normocephalic and atraumatic.  Right Ear: External ear normal.  Left Ear: External ear normal.  Nose: Nose normal.  Mouth/Throat: Oropharynx is clear and moist.  Eyes: Pupils are equal, round, and reactive to light. Conjunctivae and EOM are normal. No scleral icterus.  Neck: Neck supple. No thyromegaly present.  Cardiovascular: Normal rate, regular rhythm, normal heart sounds and intact distal pulses.  No murmur heard. Pulmonary/Chest: Effort normal and breath sounds normal. No respiratory distress. She has no wheezes. She has no rales.  Abdominal: Soft. Bowel sounds are normal. She exhibits no distension. There is no tenderness. There is no rebound and no guarding.  Musculoskeletal: She exhibits no edema or deformity.  Lymphadenopathy:    She has no  cervical adenopathy.  Neurological: She is alert and oriented to person, place, and time.  Skin: Skin is warm and dry. Capillary refill takes less than 2 seconds. No rash noted.  Psychiatric: She has a normal mood and affect. Her behavior is normal.  Vitals reviewed.   EKG: Normal sinus rhythm, no signs of ischemia  No exam data present  Activities of Daily Living In your present state of health, do you have any difficulty performing the following activities: 08/10/2017  Hearing? Y  Vision? N  Difficulty concentrating or making decisions? N  Walking or climbing stairs? Y  Dressing or bathing? N  Doing errands, shopping? N  Some recent data might be hidden    Fall Risk Assessment Fall Risk  08/10/2017  Falls in the past year? No     Depression Screen PHQ 2/9 Scores 08/10/2017  PHQ - 2 Score 0    Cognitive Testing - 6-CIT  Correct? Score   What year is it? yes 0 0 or 4  What month is it? yes 0 0 or 3  Memorize:    Pia Mau,  44,  [address],  [place],      What time is it? (within 1 hour) yes 0 0 or 3  Count backwards from 20 yes 0 0, 2, or 4  Name the months of the year yes 0 0, 2, or 4  Repeat name & address above yes 3 0, 2, 4, 6, 8, or 10       TOTAL SCORE  3/28   Interpretation:  Normal  Normal (0-7) Abnormal (8-28)     Assessment & Plan:     Initial Preventative Physical Exam  Reviewed patient's Family Medical History Reviewed and updated list of patient's medical providers Assessment of cognitive impairment was done Assessed patient's functional ability Established a written schedule for health screening [place] Completed and Reviewed  Exercise Activities and Dietary recommendations Goals   None     Immunization History  Administered Date(s) Administered  . Influenza-Unspecified 12/27/2014, 10/28/2015  . Pneumococcal Conjugate-13 08/10/2017  . Tdap 10/28/2010    Health  Maintenance  Topic Date Due  . DEXA SCAN   06/25/2017  . INFLUENZA VACCINE  08/27/2017  . COLONOSCOPY  12/28/2017  . MAMMOGRAM  02/06/2018  . PNA vac Low Risk Adult (2 of 2 - PPSV23) 08/11/2018  . PAP SMEAR  02/04/2019  . TETANUS/TDAP  10/27/2020  . Hepatitis C Screening  Completed  . HIV Screening  Completed     Discussed health benefits of physical activity, and encouraged her to engage in regular exercise appropriate for her age and condition.    ------------------------------------------------------------------------------------------------------------  Problem List Items Addressed This Visit      Other   Morbid obesity ([place])    Other Visit Diagnoses    Welcome to Medicare preventive visit    -  Primary   Relevant Orders   EKG 12-Lead (Completed)   Estrogen deficiency       Relevant Orders   DG Bone Density       Return in about 3 months (around 12/22/2017) for as scheduled for chronic disease f/u.   The entirety of the information documented in the History of Present Illness, Review of Systems and Physical Exam were personally obtained by me. Portions of this information were initially documented by Tiburcio Pea, CMA and reviewed by me for thoroughness and accuracy.    Virginia Crews, MD, MPH [facility] 09/21/2017 4:19 PM

## 2017-10-01 ENCOUNTER — Telehealth: Payer: Self-pay | Admitting: Gastroenterology

## 2017-10-01 NOTE — Telephone Encounter (Signed)
Pt left vm to schedule colonoscopy with Dr. Allen Norris in December

## 2017-10-06 ENCOUNTER — Other Ambulatory Visit: Payer: Self-pay

## 2017-10-06 ENCOUNTER — Encounter: Payer: Self-pay | Admitting: Cardiovascular Disease

## 2017-10-06 ENCOUNTER — Ambulatory Visit (INDEPENDENT_AMBULATORY_CARE_PROVIDER_SITE_OTHER): Payer: Medicare Other | Admitting: Cardiovascular Disease

## 2017-10-06 VITALS — BP 124/60 | HR 60 | Ht 62.0 in | Wt 216.5 lb

## 2017-10-06 DIAGNOSIS — I2510 Atherosclerotic heart disease of native coronary artery without angina pectoris: Secondary | ICD-10-CM

## 2017-10-06 DIAGNOSIS — I10 Essential (primary) hypertension: Secondary | ICD-10-CM | POA: Diagnosis not present

## 2017-10-06 DIAGNOSIS — E785 Hyperlipidemia, unspecified: Secondary | ICD-10-CM

## 2017-10-06 DIAGNOSIS — Z86010 Personal history of colonic polyps: Secondary | ICD-10-CM

## 2017-10-06 NOTE — Patient Instructions (Signed)
Medication Instructions: Your physician recommends that you continue on your current medications as directed. Please refer to the Current Medication list given to you today.  If you need a refill on your cardiac medications before your next appointment, please call your pharmacy.   Follow-Up: Your physician wants you to follow-up in 12 months with Dr. Arida. You will receive a reminder letter in the mail two months in advance. If you don't receive a letter, please call our office at [phone] to schedule this follow-up appointment.  Thank you for choosing [facility]!    

## 2017-10-06 NOTE — Telephone Encounter (Signed)
Pt has been scheduled for colonoscopy with Dr. Allen Norris at [facility] on 12/31/17.

## 2017-10-06 NOTE — Progress Notes (Signed)
Cardiology Office Note   Date:  10/06/2017   ID:  Olivia Dougherty, DOB 02/13/1950, MRN [id number]  PCP:  Olivia Crews, MD  Cardiologist:   Olivia Sacramento, MD   Chief Complaint  Patient presents with  . other    6 month follow up. Meds reviewed by the pt. verbally. Pt. c/o left arm pain that starts at her shoulder down to her elbow.       History of Present Illness: Olivia Dougherty is a 67 y.o. female who presents to establish cardiovascular care.  She has known history of coronary artery disease and was previously followed by Dr. Humphrey Rolls.  She underwent LAD PCI and Cypher drug-eluting stent placement with balloon angioplasty of diagonal in 2005 by Dr. Rollene Fare.  No cardiac events since then.  I did perform cardiac catheterization on her in 2010 which showed patent stent with minimal restenosis.  She has been on dual antiplatelet therapy since then.  She has known history of hyperlipidemia with intolerance to most statins due to myalgia.  She had CTA of the coronary arteries in 2017 which showed minimal plaque with no obstructive disease and patent LAD stent.  During last visit, I switched her from Livalo to rosuvastatin and she has been tolerating this medication with improvement in lipid profile.  She denies any chest pain or shortness of breath.  She has been doing well overall.    Past Medical History:  Diagnosis Date  . Anxiety   . Arthritis    lower back  . Basal cell carcinoma 11/2011   face, left leg-using chemo cream on temple area  . Coronary artery disease 2005   LAD PCI with Cypher drug-eluting stent placement 3.0 x 13 mm with balloon angioplasty of diagonal.  Cardiac catheterization in 2010 showed patent stent with minimal restenosis.  Marland Kitchen GERD (gastroesophageal reflux disease)   . Heart disease   . Heart problem    98% blockage  . Hypercholesteremia   . Hypertension   . Hypothyroidism   . Osteoarthritis   . PONV (postoperative nausea and vomiting)    after 1  procedure approx 10 yrs ago  . Sleep apnea    mild, could not tol cpap    Past Surgical History:  Procedure Laterality Date  . ANGIOPLASTY  2005   CAD   98% blockage  . CARDIAC CATHETERIZATION  02/2008  . COLONOSCOPY WITH PROPOFOL N/A 12/29/2014   Procedure: COLONOSCOPY WITH PROPOFOL;  Surgeon: Lucilla Lame, MD;  Location: [place];  Service: Endoscopy;  Laterality: N/A;  . CORONARY ANGIOPLASTY  2005   LAD  . HYSTEROSCOPY  11/2001   resection of benign leiomyoma  . KNEE ARTHROSCOPY Right 08/16/2014   Procedure: ARTHROSCOPY KNEE, partial lateral menisectomy and chondroplasty;  Surgeon: Dereck Leep, MD;  Location: [facility];  Service: Orthopedics;  Laterality: Right;  . POLYPECTOMY  12/29/2014   Procedure: POLYPECTOMY;  Surgeon: Lucilla Lame, MD;  Location: [facility];  Service: Endoscopy;;  . THYROID CYST EXCISION Left 10/2002   left lobe thyroid excision  . THYROIDECTOMY, PARTIAL Left   . TUBAL LIGATION  1980   BTL  . UMBILICAL HERNIA REPAIR  1980  . vulvar cancer  1993   laser     Current Outpatient Medications  Medication Sig Dispense Refill  . aspirin 81 MG tablet Take 81 mg by mouth daily.    . clopidogrel (PLAVIX) 75 MG tablet Take 75 mg by mouth daily with breakfast.    .  fluticasone (FLONASE) 50 MCG/ACT nasal spray Place 2 sprays into both nostrils daily.    Marland Kitchen levothyroxine (SYNTHROID, LEVOTHROID) 75 MCG tablet Take 1 tablet (75 mcg total) by mouth daily before breakfast. 30 tablet 2  . loratadine (CLARITIN) 10 MG tablet Take 10 mg by mouth daily.    Marland Kitchen losartan (COZAAR) 100 MG tablet Take 100 mg by mouth daily.    . meloxicam (MOBIC) 7.5 MG tablet Take 7.5 mg by mouth 2 (two) times daily.    . metoprolol tartrate (LOPRESSOR) 25 MG tablet Take 25 mg by mouth 2 (two) times daily.    . Multiple Vitamins-Minerals (MULTIVITAMIN PO) Take by mouth daily.    . ranitidine (ZANTAC) 150 MG capsule 150 mg 2 (two) times daily.     . rosuvastatin (CRESTOR) 10 MG  tablet Take 1 tablet (10 mg total) by mouth daily. 90 tablet 3  . Vitamin D, Ergocalciferol, (DRISDOL) 50000 units CAPS capsule Take 1 capsule (50,000 Units total) by mouth every 14 (fourteen) days. 6 capsule 4   No current facility-administered medications for this visit.     Allergies:   Atorvastatin; Codeine; Ace inhibitors; Enalapril; Penicillins; Sulfa antibiotics; and Adhesive [tape]    Social History:  The patient  reports that she has never smoked. She has never used smokeless tobacco. She reports that she drank alcohol. She reports that she does not use drugs.   Family History:  The patient's family history includes Cervical cancer in her sister; Diabetes in her father; Heart disease in her father; Hypertension in her father and mother; Stroke in her sister.    ROS:  Please see the history of present illness.   Otherwise, review of systems are positive for none.   All other systems are reviewed and negative.    PHYSICAL EXAM: VS:  BP 124/60 (BP Location: Left Arm, Patient Position: Sitting, Cuff Size: Normal)   Pulse 60   Ht 5\' 2"  (1.575 m)   Wt 216 lb 8 oz (98.2 kg)   LMP 01/28/2007 (Approximate)   BMI 39.60 kg/m  , BMI Body mass index is 39.6 kg/m. GEN: Well nourished, well developed, in no acute distress  HEENT: normal  Neck: no JVD, carotid bruits, or masses Cardiac: RRR; no  rubs, or gallops,no edema .  1 / 6 systolic ejection murmur in the aortic area Respiratory:  clear to auscultation bilaterally, normal work of breathing GI: soft, nontender, nondistended, + BS MS: no deformity or atrophy  Skin: warm and dry, no rash Neuro:  Strength and sensation are intact Psych: euthymic mood, full affect   EKG:  EKG is ordered today. The ekg ordered today demonstrates normal sinus rhythm with no significant ST or T wave changes.   Recent Labs: 05/21/2017: ALT 18    Lipid Panel    Component Value Date/Time   CHOL 158 05/21/2017 0957   TRIG 110 05/21/2017 0957    HDL 57 05/21/2017 0957   CHOLHDL 2.8 05/21/2017 0957   VLDL 22 05/21/2017 0957   LDLCALC 79 05/21/2017 0957      Wt Readings from Last 3 Encounters:  10/06/17 216 lb 8 oz (98.2 kg)  09/21/17 219 lb (99.3 kg)  08/10/17 220 lb (99.8 kg)       No flowsheet data found.    ASSESSMENT AND PLAN:  1.  Coronary artery disease involving native coronary arteries without angina: Given that she had first generation drug-eluting stent to the LAD, I favor continued lifelong dual antiplatelet therapy as tolerated.  Continue medical therapy.  No anginal symptoms.  2.  Hyperlipidemia with intolerance to statins.  She is tolerating rosuvastatin without significant myalgia.  LDL improved to 79.  I discussed with her the option of adding Zetia but she wants to try to improve her lifestyle and attempt some weight loss.  3.  Essential hypertension: Blood pressure is controlled on current medications.    Disposition:   FU with me in 12 months  Signed,  Olivia Sacramento, MD  10/06/2017 2:46 PM    [place]

## 2017-10-15 ENCOUNTER — Ambulatory Visit
Admission: RE | Admit: 2017-10-15 | Discharge: 2017-10-15 | Disposition: A | Discharge status: Home / Self Care | Payer: Medicare Other | Source: Ambulatory Visit | Attending: Family Medicine | Admitting: Family Medicine

## 2017-10-15 DIAGNOSIS — Z780 Asymptomatic menopausal state: Secondary | ICD-10-CM | POA: Diagnosis not present

## 2017-10-15 DIAGNOSIS — E2839 Other primary ovarian failure: Secondary | ICD-10-CM | POA: Diagnosis not present

## 2017-10-19 ENCOUNTER — Other Ambulatory Visit: Payer: Self-pay

## 2017-10-19 NOTE — Telephone Encounter (Signed)
Please advise if ok to refill Plavix and losartan.

## 2017-10-19 NOTE — Telephone Encounter (Signed)
*  STAT* If patient is at the pharmacy, call can be transferred to refill team.   1. Which medications need to be refilled? (please list name of each medication and dose if known) Plavix, Losartan  2. Which pharmacy/location (including street and city if local pharmacy) is medication to be sent to? [place]  3. Do they need a 30 day or 90 day supply? [place]

## 2017-10-20 MED ORDER — LOSARTAN POTASSIUM 100 MG PO TABS: 100.0000 mg | ORAL_TABLET | Freq: Every day | ORAL | 3 refills | Status: DC

## 2017-10-20 MED ORDER — CLOPIDOGREL BISULFATE 75 MG PO TABS: 75.0000 mg | ORAL_TABLET | Freq: Every day | ORAL | 3 refills | Status: DC

## 2017-10-20 NOTE — Telephone Encounter (Signed)
Patient seen on 9/10/1 by Dr. Fletcher Anon. No medication changes made at that time. OK to refill both meds.  Thanks!

## 2017-10-21 ENCOUNTER — Ambulatory Visit (INDEPENDENT_AMBULATORY_CARE_PROVIDER_SITE_OTHER): Payer: Medicare Other

## 2017-10-21 DIAGNOSIS — Z23 Encounter for immunization: Secondary | ICD-10-CM | POA: Diagnosis not present

## 2017-10-23 DIAGNOSIS — D0462 Carcinoma in situ of skin of left upper limb, including shoulder: Secondary | ICD-10-CM | POA: Diagnosis not present

## 2017-12-01 ENCOUNTER — Encounter: Payer: Self-pay | Admitting: Family Medicine

## 2017-12-01 ENCOUNTER — Ambulatory Visit
Admission: RE | Admit: 2017-12-01 | Discharge: 2017-12-01 | Disposition: A | Discharge status: Home / Self Care | Payer: Medicare Other | Source: Ambulatory Visit | Attending: Family Medicine | Admitting: Family Medicine

## 2017-12-01 ENCOUNTER — Other Ambulatory Visit: Payer: Self-pay | Admitting: *Deleted

## 2017-12-01 ENCOUNTER — Ambulatory Visit (INDEPENDENT_AMBULATORY_CARE_PROVIDER_SITE_OTHER): Payer: Medicare Other | Admitting: Family Medicine

## 2017-12-01 VITALS — BP 126/82 | HR 85 | Temp 98.6°F | Wt 218.8 lb

## 2017-12-01 DIAGNOSIS — M545 Low back pain, unspecified: Secondary | ICD-10-CM

## 2017-12-01 DIAGNOSIS — Z7901 Long term (current) use of anticoagulants: Secondary | ICD-10-CM | POA: Diagnosis not present

## 2017-12-01 DIAGNOSIS — I10 Essential (primary) hypertension: Secondary | ICD-10-CM

## 2017-12-01 DIAGNOSIS — E039 Hypothyroidism, unspecified: Secondary | ICD-10-CM

## 2017-12-01 MED ORDER — CYCLOBENZAPRINE HCL 5 MG PO TABS: 5.0000 mg | ORAL_TABLET | Freq: Three times a day (TID) | ORAL | 1 refills | Status: DC | PRN

## 2017-12-01 MED ORDER — METOPROLOL TARTRATE 25 MG PO TABS: 25.0000 mg | ORAL_TABLET | Freq: Two times a day (BID) | ORAL | 3 refills | Status: DC

## 2017-12-01 NOTE — Patient Instructions (Signed)

## 2017-12-01 NOTE — Progress Notes (Signed)
Patient: Olivia Dougherty Female    DOB: 19-May-1950   67 y.o.   MRN: [id number] Visit Date: 12/01/2017  Today's Provider: [name], MD   Chief Complaint  Patient presents with  . Hypertension  . Hypothyroidism   Subjective:    HPI    Hypertension, follow-up:  BP Readings from Last 3 Encounters:  12/01/17 126/82  10/06/17 124/60  09/21/17 (!) 144/68    She was last seen for hypertension months ago.  BP at that visit was 126/82. Management changes since that visit include still taking the Losartan & Metoprolol Tartrate for BP. She reports good compliance with treatment. She is not having side effects.  She is not exercising. She is not adherent to low salt diet.   Outside blood pressures are 130-134/70-80. She is experiencing none.  Patient denies chest pain, chest pressure/discomfort, dyspnea, exertional chest pressure/discomfort, irregular heart beat, lower extremity edema, palpitations and tachypnea.   Cardiovascular risk factors include hypertension.  Use of agents associated with hypertension: none.     Weight trend: increasing rapidly Wt Readings from Last 3 Encounters:  12/01/17 218 lb 12.8 oz (99.2 kg)  10/06/17 216 lb 8 oz (98.2 kg)  09/21/17 219 lb (99.3 kg)    Current diet: well balanced  ------------------------------------------------------------------------  Hypothyroidism Olivia Dougherty is a 67 y.o. female who presents for follow up of hypothyroidism. Current symptoms: weight changes . Patient denies change in energy level, diarrhea, heat / cold intolerance, nervousness and palpitations. Symptoms have stabilized.  Patient is also concerned about episode of R sided LBP with radiation into R buttock x2 months.  She has tried aspercreme with lidocaine and ice without relief.  She finds that heat is helpful and Mobic helps also (she is cautious about taking this as she is on DAPT and has HTN).  The pain is described as throbbing, waxin and  waning.  She denies any fevers, weakness or numbness of the lower extremities, saddle anesthesia, or radiation down her leg.    Allergies  Allergen Reactions  . Atorvastatin Other (See Comments)    Other reaction(s): myalgia  . Codeine Shortness Of Breath  . Ace Inhibitors Cough       . Enalapril Cough  . Penicillins Hives       . Sulfa Antibiotics Hives and Swelling       . Adhesive [Tape] Rash          Current Outpatient Medications:  .  aspirin 81 MG tablet, Take 81 mg by mouth daily., Disp: , Rfl:  .  clopidogrel (PLAVIX) 75 MG tablet, Take 1 tablet (75 mg total) by mouth daily with breakfast., Disp: 90 tablet, Rfl: 3 .  fluticasone (FLONASE) 50 MCG/ACT nasal spray, Place 2 sprays into both nostrils daily., Disp: , Rfl:  .  levothyroxine (SYNTHROID, LEVOTHROID) 75 MCG tablet, Take 1 tablet (75 mcg total) by mouth daily before breakfast., Disp: 30 tablet, Rfl: 2 .  loratadine (CLARITIN) 10 MG tablet, Take 10 mg by mouth daily., Disp: , Rfl:  .  losartan (COZAAR) 100 MG tablet, Take 1 tablet (100 mg total) by mouth daily., Disp: 90 tablet, Rfl: 3 .  meloxicam (MOBIC) 7.5 MG tablet, Take 7.5 mg by mouth 2 (two) times daily., Disp: , Rfl:  .  metoprolol tartrate (LOPRESSOR) 25 MG tablet, Take 1 tablet (25 mg total) by mouth 2 (two) times daily., Disp: 180 tablet, Rfl: 3 .  Multiple Vitamins-Minerals (MULTIVITAMIN PO), Take by mouth daily.,  Disp: , Rfl:  .  ranitidine (ZANTAC) 150 MG capsule, 150 mg 2 (two) times daily. , Disp: , Rfl:  .  Vitamin D, Ergocalciferol, (DRISDOL) 50000 units CAPS capsule, Take 1 capsule (50,000 Units total) by mouth every 14 (fourteen) days., Disp: 6 capsule, Rfl: 4 .  rosuvastatin (CRESTOR) 10 MG tablet, Take 1 tablet (10 mg total) by mouth daily., Disp: 90 tablet, Rfl: 3  Review of Systems  Constitutional: Negative.   HENT: Negative.   Respiratory: Negative.   Gastrointestinal: Negative.   Genitourinary: Negative.   Neurological: Negative.      Social History   Tobacco Use  . Smoking status: Never Smoker  . Smokeless tobacco: Never Used  Substance Use Topics  . Alcohol use: Not Currently   Objective:   BP 126/82 (BP Location: Right Arm, Patient Position: Sitting, Cuff Size: Normal)   Pulse 85   Temp 98.6 F (37 C) (Oral)   Wt 218 lb 12.8 oz (99.2 kg)   LMP 01/28/2007 (Approximate)   SpO2 98%   BMI 40.02 kg/m  Vitals:   12/01/17 1049  BP: 126/82  Pulse: 85  Temp: 98.6 F (37 C)  TempSrc: Oral  SpO2: 98%  Weight: 218 lb 12.8 oz (99.2 kg)     Physical Exam  Constitutional: She is oriented to person, place, and time. She appears well-developed and well-nourished. No distress.  HENT:  Head: Normocephalic and atraumatic.  Mouth/Throat: Oropharynx is clear and moist.  Eyes: Pupils are equal, round, and reactive to light. Conjunctivae and EOM are normal. No scleral icterus.  Neck: Neck supple. No thyromegaly present.  Cardiovascular: Normal rate, regular rhythm, normal heart sounds and intact distal pulses.  No murmur heard. Pulmonary/Chest: Effort normal and breath sounds normal. No respiratory distress. She has no wheezes. She has no rales.  Musculoskeletal: She exhibits no edema.  Back: No midline tenderness to palpation.  Mild tenderness to palpation on the right side of the sacrum.  No SI joint tenderness to palpation.  No paraspinal musculature tenderness to palpation, but paraspinal musculature is tight.  Normal range of motion of lower extremities.  Sensation to light touch grossly intact.  Strength symmetric and 5 out of 5 throughout bilateral lower extremities.  Negative straight leg raise bilaterally.  Lymphadenopathy:    She has no cervical adenopathy.  Neurological: She is alert and oriented to person, place, and time.  Skin: Skin is warm and dry. Capillary refill takes less than 2 seconds. No rash noted.  Psychiatric: She has a normal mood and affect. Her behavior is normal.  Vitals  reviewed.       Assessment & Plan:   Problem List Items Addressed This Visit      Cardiovascular and Mediastinum   Essential hypertension - Primary    Well-controlled Recheck BMP Continue current medications      Relevant Orders   Basic Metabolic Panel (BMET) (Completed)     Endocrine   Acquired hypothyroidism    Previously well controlled Continue current Synthroid dose Recheck TSH      Relevant Orders   TSH (Completed)     Other   Morbid obesity ([place])    Discussed importance of healthy weight management Discussed diet and exercise      Acute right-sided low back pain without sciatica    New problem Likely related to muscle tightness and DJD We will get x-ray to assess amount of DJD present Given home exercise program and referred to physical therapy Can try Flexeril sparingly  Would avoid NSAIDs if possible given that she is on dual antiplatelet therapy      Relevant Medications   cyclobenzaprine (FLEXERIL) 5 MG tablet   Other Relevant Orders   DG Lumbar Spine Complete (Completed)   Ambulatory referral to Physical Therapy    Other Visit Diagnoses    Long term (current) use of anticoagulants       Relevant Orders   CBC w/Diff/Platelet (Completed)       Return in about 6 months (around 06/01/2018) for chronic disease f/u.   The entirety of the information documented in the History of Present Illness, Review of Systems and Physical Exam were personally obtained by me. Portions of this information were initially documented by Hurman Horn, CMA and reviewed by me for thoroughness and accuracy.    Virginia Crews, MD, MPH [facility] 12/02/2017 8:40 AM

## 2017-12-02 ENCOUNTER — Telehealth: Payer: Self-pay

## 2017-12-02 DIAGNOSIS — M545 Low back pain, unspecified: Secondary | ICD-10-CM | POA: Insufficient documentation

## 2017-12-02 LAB — CBC WITH DIFFERENTIAL/PLATELET
BASOS ABS: 0 10*3/uL (ref 0.0–0.2)
Basos: 1 %
EOS (ABSOLUTE): 0.2 10*3/uL (ref 0.0–0.4)
Eos: 3 %
HEMOGLOBIN: 13.9 g/dL (ref 11.1–15.9)
Hematocrit: 41.6 % (ref 34.0–46.6)
IMMATURE GRANS (ABS): 0 10*3/uL (ref 0.0–0.1)
IMMATURE GRANULOCYTES: 0 %
LYMPHS: 29 %
Lymphocytes Absolute: 1.9 10*3/uL (ref 0.7–3.1)
MCH: 30.6 pg (ref 26.6–33.0)
MCHC: 33.4 g/dL (ref 31.5–35.7)
MCV: 92 fL (ref 79–97)
MONOCYTES: 9 %
Monocytes Absolute: 0.6 10*3/uL (ref 0.1–0.9)
NEUTROS PCT: 58 %
Neutrophils Absolute: 3.8 10*3/uL (ref 1.4–7.0)
PLATELETS: 267 10*3/uL (ref 150–450)
RBC: 4.54 x10E6/uL (ref 3.77–5.28)
RDW: 12.3 % (ref 12.3–15.4)
WBC: 6.6 10*3/uL (ref 3.4–10.8)

## 2017-12-02 LAB — BASIC METABOLIC PANEL
BUN / CREAT RATIO: 18 (ref 12–28)
BUN: 15 mg/dL (ref 8–27)
CALCIUM: 9.4 mg/dL (ref 8.7–10.3)
CHLORIDE: 102 mmol/L (ref 96–106)
CO2: 24 mmol/L (ref 20–29)
CREATININE: 0.85 mg/dL (ref 0.57–1.00)
GFR calc Af Amer: 83 mL/min/{1.73_m2} (ref >59)
GFR calc non Af Amer: 72 mL/min/{1.73_m2} (ref >59)
GLUCOSE: 88 mg/dL (ref 65–99)
Potassium: 4.2 mmol/L (ref 3.5–5.2)
Sodium: 139 mmol/L (ref 134–144)

## 2017-12-02 LAB — TSH: TSH: 0.951 u[IU]/mL (ref 0.450–4.500)

## 2017-12-02 NOTE — Assessment & Plan Note (Signed)
New problem Likely related to muscle tightness and DJD We will get x-ray to assess amount of DJD present Given home exercise program and referred to physical therapy Can try Flexeril sparingly Would avoid NSAIDs if possible given that she is on dual antiplatelet therapy

## 2017-12-02 NOTE — Telephone Encounter (Signed)
Pt returned call ° °Thanks  teri °

## 2017-12-02 NOTE — Telephone Encounter (Signed)
LMTCB, labs and x-ray results also released to My Chart

## 2017-12-02 NOTE — Telephone Encounter (Signed)
-----   Message from Virginia Crews, MD sent at 12/02/2017  8:27 AM EST ----- Arthritis of the lumbar spine, especially in the lower spine where it meets the sacrum where your pain is.  Small density seen that could represent a kidney stone of the right kidney.  We can look into this further if patient develops any symptoms of kidney stone such as flank pain, pain with urination, blood in her urine.  Virginia Crews, MD, MPH [facility] 12/02/2017 8:27 AM

## 2017-12-02 NOTE — Assessment & Plan Note (Signed)
Discussed importance of healthy weight management Discussed diet and exercise  

## 2017-12-02 NOTE — Telephone Encounter (Signed)
-----   Message from Virginia Crews, MD sent at 12/02/2017  8:30 AM EST ----- Normal labs including blood sugar, kidney function, electrolytes, blood counts, thyroid function.  Virginia Crews, MD, MPH [facility] 12/02/2017 8:29 AM

## 2017-12-02 NOTE — Assessment & Plan Note (Signed)
Well-controlled Recheck BMP Continue current medications

## 2017-12-02 NOTE — Assessment & Plan Note (Signed)
Previously well controlled Continue current Synthroid dose Recheck TSH

## 2017-12-04 ENCOUNTER — Other Ambulatory Visit: Payer: Self-pay

## 2017-12-04 MED ORDER — PEG 3350-KCL-NA BICARB-NACL 420 G PO SOLR: ORAL | 0 refills | Status: DC

## 2017-12-04 NOTE — Telephone Encounter (Signed)
Olivia Dougherty, patient is returning your call regarding lab results.  She seen the results in my chart, but has questions.

## 2017-12-04 NOTE — Telephone Encounter (Signed)
Patient advised.

## 2017-12-07 DIAGNOSIS — M545 Low back pain: Secondary | ICD-10-CM | POA: Diagnosis not present

## 2017-12-10 DIAGNOSIS — M545 Low back pain: Secondary | ICD-10-CM | POA: Diagnosis not present

## 2017-12-14 ENCOUNTER — Other Ambulatory Visit: Payer: Self-pay | Admitting: Family Medicine

## 2017-12-14 DIAGNOSIS — M545 Low back pain: Secondary | ICD-10-CM | POA: Diagnosis not present

## 2017-12-15 ENCOUNTER — Telehealth: Payer: Self-pay | Admitting: Family Medicine

## 2017-12-15 NOTE — Telephone Encounter (Signed)
Pt calling regarding the kidney stone she had in early Nov. Pt asking if she will need to be referred to a kidney doctor for prevention.  Please advise.  Thanks, [employer]

## 2017-12-16 DIAGNOSIS — M545 Low back pain: Secondary | ICD-10-CM | POA: Diagnosis not present

## 2017-12-16 NOTE — Telephone Encounter (Signed)
Pt advised.   Thanks,   -Laura  

## 2017-12-16 NOTE — Telephone Encounter (Signed)
No need to see urology for one small kidney stone.  We will watch for any symptoms of recurrence  Amelda Hapke, Dionne Bucy, MD, MPH [facility] 12/16/2017 11:57 AM

## 2017-12-16 NOTE — Telephone Encounter (Signed)
LMTCB

## 2017-12-21 ENCOUNTER — Other Ambulatory Visit: Payer: Self-pay

## 2017-12-21 ENCOUNTER — Encounter: Payer: Self-pay | Admitting: *Deleted

## 2017-12-21 DIAGNOSIS — M545 Low back pain: Secondary | ICD-10-CM | POA: Diagnosis not present

## 2017-12-22 ENCOUNTER — Telehealth: Payer: Self-pay | Admitting: Cardiovascular Disease

## 2017-12-22 NOTE — Telephone Encounter (Signed)
° °  [facility] Pre-operative Risk Assessment    Request for surgical clearance:  1. What type of surgery is being performed? Colonoscopy   2. When is this surgery scheduled? 12/31/17  3. What type of clearance is required (medical clearance vs. Pharmacy clearance to hold med vs. Both)? Pharmacy per patient call  4. Are there any medications that need to be held prior to surgery and how long? Please advise patient on plavix and asa   5. Practice name and name of physician performing surgery? Dr. Allen Norris [facility]   6. What is your office phone number [phone]   7.   What is your office fax number [phone]  8.   Anesthesia type (None, local, MAC, general) ? Yes not Known    Clarisse Gouge 12/22/2017, 10:24 AM  _________________________________________________________________   (provider comments below)

## 2017-12-23 DIAGNOSIS — M545 Low back pain: Secondary | ICD-10-CM | POA: Diagnosis not present

## 2017-12-24 NOTE — Telephone Encounter (Signed)
Low risk.  Hold Plavix 5 days before colonoscopy and resume 1 day after.

## 2017-12-28 NOTE — Telephone Encounter (Signed)
Patient called and made aware of the recommendations. She has verbalized her understanding. She stated that she stopped the Plavix on 11/26 in preparation for the colonoscopy.  Clearance has been faxed to [facility].

## 2017-12-29 DIAGNOSIS — M545 Low back pain: Secondary | ICD-10-CM | POA: Diagnosis not present

## 2017-12-30 NOTE — Discharge Instructions (Signed)
General Anesthesia, Adult, Care After °These instructions provide you with information about caring for yourself after your procedure. Your health care provider may also give you more specific instructions. Your treatment has been planned according to current medical practices, but problems sometimes occur. Call your health care provider if you have any problems or questions after your procedure. °What can I expect after the procedure? °After the procedure, it is common to have: °· Vomiting. °· A sore throat. °· Mental slowness. ° °It is common to feel: °· Nauseous. °· Cold or shivery. °· Sleepy. °· Tired. °· Sore or achy, even in parts of your body where you did not have surgery. ° °Follow these instructions at home: °For at least 24 hours after the procedure: °· Do not: °? Participate in activities where you could fall or become injured. °? Drive. °? Use heavy machinery. °? Drink alcohol. °? Take sleeping pills or medicines that cause drowsiness. °? Make important decisions or sign legal documents. °? Take care of children on your own. °· Rest. °Eating and drinking °· If you vomit, drink water, juice, or soup when you can drink without vomiting. °· Drink enough fluid to keep your urine clear or pale yellow. °· Make sure you have little or no nausea before eating solid foods. °· Follow the diet recommended by your health care provider. °General instructions °· Have a responsible adult stay with you until you are awake and alert. °· Return to your normal activities as told by your health care provider. Ask your health care provider what activities are safe for you. °· Take over-the-counter and prescription medicines only as told by your health care provider. °· If you smoke, do not smoke without supervision. °· Keep all follow-up visits as told by your health care provider. This is important. °Contact a health care provider if: °· You continue to have nausea or vomiting at home, and medicines are not helpful. °· You  cannot drink fluids or start eating again. °· You cannot urinate after 8-12 hours. °· You develop a skin rash. °· You have fever. °· You have increasing redness at the site of your procedure. °Get help right away if: °· You have difficulty breathing. °· You have chest pain. °· You have unexpected bleeding. °· You feel that you are having a life-threatening or urgent problem. °This information is not intended to replace advice given to you by your health care provider. Make sure you discuss any questions you have with your health care provider. °Document Released: 04/21/2000 Document Revised: 06/18/2015 Document Reviewed: 12/28/2014 °Elsevier Interactive Patient Education © 2018 [employer] Inc[employer] ° °

## 2017-12-31 ENCOUNTER — Ambulatory Visit: Payer: Medicare Other | Admitting: Anesthesiology

## 2017-12-31 ENCOUNTER — Ambulatory Visit
Admission: RE | Admit: 2017-12-31 | Discharge: 2017-12-31 | Disposition: A | Discharge status: Home / Self Care | Payer: Medicare Other | Source: Ambulatory Visit | Attending: Gastroenterology | Admitting: Gastroenterology

## 2017-12-31 ENCOUNTER — Encounter
Admission: RE | Disposition: A | Discharge status: Home / Self Care | Payer: Self-pay | Source: Ambulatory Visit | Attending: Gastroenterology

## 2017-12-31 DIAGNOSIS — I10 Essential (primary) hypertension: Secondary | ICD-10-CM | POA: Insufficient documentation

## 2017-12-31 DIAGNOSIS — Z860100 Personal history of colon polyps, unspecified: Secondary | ICD-10-CM

## 2017-12-31 DIAGNOSIS — I2510 Atherosclerotic heart disease of native coronary artery without angina pectoris: Secondary | ICD-10-CM | POA: Diagnosis not present

## 2017-12-31 DIAGNOSIS — K635 Polyp of colon: Secondary | ICD-10-CM | POA: Insufficient documentation

## 2017-12-31 DIAGNOSIS — Z7902 Long term (current) use of antithrombotics/antiplatelets: Secondary | ICD-10-CM | POA: Diagnosis not present

## 2017-12-31 DIAGNOSIS — Z791 Long term (current) use of non-steroidal anti-inflammatories (NSAID): Secondary | ICD-10-CM | POA: Diagnosis not present

## 2017-12-31 DIAGNOSIS — Z7982 Long term (current) use of aspirin: Secondary | ICD-10-CM | POA: Diagnosis not present

## 2017-12-31 DIAGNOSIS — E7800 Pure hypercholesterolemia, unspecified: Secondary | ICD-10-CM | POA: Insufficient documentation

## 2017-12-31 DIAGNOSIS — Z09 Encounter for follow-up examination after completed treatment for conditions other than malignant neoplasm: Secondary | ICD-10-CM | POA: Diagnosis not present

## 2017-12-31 DIAGNOSIS — K640 First degree hemorrhoids: Secondary | ICD-10-CM | POA: Diagnosis not present

## 2017-12-31 DIAGNOSIS — G4730 Sleep apnea, unspecified: Secondary | ICD-10-CM | POA: Insufficient documentation

## 2017-12-31 DIAGNOSIS — Z6839 Body mass index (BMI) 39.0-39.9, adult: Secondary | ICD-10-CM | POA: Diagnosis not present

## 2017-12-31 DIAGNOSIS — K219 Gastro-esophageal reflux disease without esophagitis: Secondary | ICD-10-CM | POA: Diagnosis not present

## 2017-12-31 DIAGNOSIS — Z79899 Other long term (current) drug therapy: Secondary | ICD-10-CM | POA: Insufficient documentation

## 2017-12-31 DIAGNOSIS — Z955 Presence of coronary angioplasty implant and graft: Secondary | ICD-10-CM | POA: Diagnosis not present

## 2017-12-31 DIAGNOSIS — D122 Benign neoplasm of ascending colon: Secondary | ICD-10-CM | POA: Diagnosis not present

## 2017-12-31 DIAGNOSIS — Z85828 Personal history of other malignant neoplasm of skin: Secondary | ICD-10-CM | POA: Diagnosis not present

## 2017-12-31 DIAGNOSIS — Z8544 Personal history of malignant neoplasm of other female genital organs: Secondary | ICD-10-CM | POA: Insufficient documentation

## 2017-12-31 DIAGNOSIS — E039 Hypothyroidism, unspecified: Secondary | ICD-10-CM | POA: Diagnosis not present

## 2017-12-31 DIAGNOSIS — K5730 Diverticulosis of large intestine without perforation or abscess without bleeding: Secondary | ICD-10-CM | POA: Diagnosis not present

## 2017-12-31 DIAGNOSIS — Z7951 Long term (current) use of inhaled steroids: Secondary | ICD-10-CM | POA: Insufficient documentation

## 2017-12-31 HISTORY — DX: Calculus of kidney: N20.0

## 2017-12-31 HISTORY — PX: COLONOSCOPY WITH PROPOFOL: SHX5780

## 2017-12-31 SURGERY — COLONOSCOPY WITH PROPOFOL
Anesthesia: General

## 2017-12-31 MED ORDER — LIDOCAINE HCL (CARDIAC) PF 100 MG/5ML IV SOSY
PREFILLED_SYRINGE | INTRAVENOUS | Status: DC | PRN
  Administered 2017-12-31: 30 mg via INTRAVENOUS

## 2017-12-31 MED ORDER — OXYCODONE HCL 5 MG/5ML PO SOLN: 5.0000 mg | Freq: Once | ORAL | Status: DC | PRN

## 2017-12-31 MED ORDER — LACTATED RINGERS IV SOLN: INTRAVENOUS | Status: DC

## 2017-12-31 MED ORDER — LACTATED RINGERS IV SOLN
INTRAVENOUS | Status: DC
  Administered 2017-12-31: 07:00:00 via INTRAVENOUS

## 2017-12-31 MED ORDER — PROPOFOL 10 MG/ML IV BOLUS
INTRAVENOUS | Status: DC | PRN
  Administered 2017-12-31: 50 mg via INTRAVENOUS
  Administered 2017-12-31: 30 mg via INTRAVENOUS
  Administered 2017-12-31: 100 mg via INTRAVENOUS
  Administered 2017-12-31: 3 mg via INTRAVENOUS
  Administered 2017-12-31: 30 mg via INTRAVENOUS

## 2017-12-31 MED ORDER — OXYCODONE HCL 5 MG PO TABS: 5.0000 mg | ORAL_TABLET | Freq: Once | ORAL | Status: DC | PRN

## 2017-12-31 SURGICAL SUPPLY — 8 items
CANISTER SUCT 1200ML W/VALVE (MISCELLANEOUS) ×2 IMPLANT
ELECT REM PT RETURN 9FT ADLT (ELECTROSURGICAL)
ELECTRODE REM PT RTRN 9FT ADLT (ELECTROSURGICAL) IMPLANT
FORCEPS BIOP RAD 4 LRG CAP 4 (CUTTING FORCEPS) ×1 IMPLANT
GOWN CVR UNV OPN BCK APRN NK (MISCELLANEOUS) ×2 IMPLANT
GOWN ISOL THUMB LOOP REG UNIV (MISCELLANEOUS) ×4
KIT ENDO PROCEDURE OLY (KITS) ×2 IMPLANT
WATER STERILE IRR 250ML POUR (IV SOLUTION) ×2 IMPLANT

## 2017-12-31 NOTE — Anesthesia Preprocedure Evaluation (Signed)
Anesthesia Evaluation  Patient identified by MRN, date of birth, ID band  Reviewed: NPO status   History of Anesthesia Complications (+) PONV and history of anesthetic complications  Airway Mallampati: II  TM Distance: >3 FB Neck ROM: full    Dental no notable dental hx.    Pulmonary sleep apnea (no cpap) ,    Pulmonary exam normal        Cardiovascular Exercise Tolerance: Good hypertension, + CAD (last plavix 7 days ago) and + Cardiac Stents (stent 2005)  Normal cardiovascular exam     Neuro/Psych PSYCHIATRIC DISORDERS Anxiety negative neurological ROS     GI/Hepatic Neg liver ROS, GERD  Controlled,  Endo/Other  Hypothyroidism Morbid obesity (bmi=40)  Renal/GU negative Renal ROS  negative genitourinary   Musculoskeletal  (+) Arthritis ,   Abdominal   Peds  Hematology negative hematology ROS (+)   Anesthesia Other Findings Cards stable: 09/2017: dr Fletcher Anon;  Reproductive/Obstetrics                             Anesthesia Physical Anesthesia Plan  ASA: II  Anesthesia Plan: General   Post-op Pain Management:    Induction:   PONV Risk Score and Plan:   Airway Management Planned: Natural Airway  Additional Equipment:   Intra-op Plan:   Post-operative Plan:   Informed Consent: I have reviewed the patients History and Physical, chart, labs and discussed the procedure including the risks, benefits and alternatives for the proposed anesthesia with the patient or authorized representative who has indicated his/her understanding and acceptance.     Plan Discussed with: CRNA  Anesthesia Plan Comments:         Anesthesia Quick Evaluation

## 2017-12-31 NOTE — H&P (Signed)
Lucilla Lame, MD [address] [place],  [address] Phone:[phone] Fax : [phone]  Primary Care Physician:  Virginia Crews, MD Primary Gastroenterologist:  Dr. Allen Norris  Pre-Procedure History & Physical: HPI:  Olivia Dougherty is a 67 y.o. female is here for an colonoscopy.   Past Medical History:  Diagnosis Date  . Anxiety   . Arthritis    lower back  . Basal cell carcinoma 11/2011   face, left leg-using chemo cream on temple area  . Coronary artery disease 2005   LAD PCI with Cypher drug-eluting stent placement 3.0 x 13 mm with balloon angioplasty of diagonal.  Cardiac catheterization in 2010 showed patent stent with minimal restenosis.  Marland Kitchen GERD (gastroesophageal reflux disease)   . Heart disease   . Heart problem    98% blockage  . Hypercholesteremia   . Hypertension   . Hypothyroidism   . Kidney stone   . Osteoarthritis   . PONV (postoperative nausea and vomiting)    after 1 procedure approx 10 yrs ago  . Sleep apnea    mild, could not tol cpap    Past Surgical History:  Procedure Laterality Date  . ANGIOPLASTY  2005   CAD   98% blockage  . CARDIAC CATHETERIZATION  02/2008  . COLONOSCOPY WITH PROPOFOL N/A 12/29/2014   Procedure: COLONOSCOPY WITH PROPOFOL;  Surgeon: Lucilla Lame, MD;  Location: [place];  Service: Endoscopy;  Laterality: N/A;  . CORONARY ANGIOPLASTY  2005   LAD  . HYSTEROSCOPY  11/2001   resection of benign leiomyoma  . KNEE ARTHROSCOPY Right 08/16/2014   Procedure: ARTHROSCOPY KNEE, partial lateral menisectomy and chondroplasty;  Surgeon: Dereck Leep, MD;  Location: [facility];  Service: Orthopedics;  Laterality: Right;  . POLYPECTOMY  12/29/2014   Procedure: POLYPECTOMY;  Surgeon: Lucilla Lame, MD;  Location: [place];  Service: Endoscopy;;  . THYROID CYST EXCISION Left 10/2002   left lobe thyroid excision  . THYROIDECTOMY, PARTIAL Left   . TUBAL LIGATION  1980   BTL  . UMBILICAL HERNIA REPAIR   1980  . 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 Authorizing Provider  aspirin 81 MG tablet Take 81 mg by mouth daily.   Yes [provider]  calcium carbonate (TUMS - DOSED IN MG ELEMENTAL CALCIUM) 500 MG chewable tablet Chew 1 tablet by mouth as needed for indigestion or heartburn.   Yes [provider]  clopidogrel (PLAVIX) 75 MG tablet Take 1 tablet (75 mg total) by mouth daily with breakfast. 10/20/17  Yes Wellington Hampshire, MD  cyclobenzaprine (FLEXERIL) 5 MG tablet Take 1 tablet (5 mg total) by mouth 3 (three) times daily as needed for muscle spasms. 12/01/17  Yes Bacigalupo, Dionne Bucy, MD  levothyroxine (SYNTHROID, LEVOTHROID) 75 MCG tablet TAKE ONE TABLET BY MOUTH DAILY BEFORE BREAKFAST 12/14/17  Yes Bacigalupo, Dionne Bucy, MD  losartan (COZAAR) 100 MG tablet Take 1 tablet (100 mg total) by mouth daily. 10/20/17  Yes Wellington Hampshire, MD  metoprolol tartrate (LOPRESSOR) 25 MG tablet Take 1 tablet (25 mg total) by mouth 2 (two) times daily. 12/01/17  Yes Wellington Hampshire, MD  Multiple Vitamins-Minerals (MULTIVITAMIN PO) Take by mouth daily.   Yes [provider]  polyethylene glycol-electrolytes (NULYTELY/GOLYTELY) 420 g solution Drink one 8 oz glass every 20 mins until entire container is finished starting at 5:00pm on 12/30/17 12/04/17  Yes Lucilla Lame, MD  rosuvastatin (CRESTOR) 10 MG tablet Take 1 tablet (10 mg total) by mouth daily. 04/07/17 12/21/17 Yes Wellington Hampshire, MD  Vitamin D, Ergocalciferol, (DRISDOL) 50000 units CAPS capsule Take 1 capsule (50,000 Units total) by mouth every 14 (fourteen) days. 02/26/17  Yes Megan Salon, MD  fluticasone [facility]) 50 MCG/ACT nasal spray Place 2 sprays into both nostrils daily.    [provider]  loratadine (CLARITIN) 10 MG tablet Take 10 mg by mouth daily.    [provider]  meloxicam (MOBIC) 7.5 MG tablet Take 7.5 mg by mouth 2  (two) times daily.    [provider]  ranitidine (ZANTAC) 150 MG capsule 150 mg 2 (two) times daily.  10/20/12   [provider]    Allergies as of 10/06/2017 - Review Complete 10/06/2017  Allergen Reaction Noted  . Atorvastatin Other (See Comments) 12/04/2014  . Codeine Shortness Of Breath 08/10/2014  . Ace inhibitors Cough 12/04/2014  . Enalapril Cough 12/04/2014  . Penicillins Hives 12/14/2012  . Sulfa antibiotics Hives and Swelling 12/14/2012  . Adhesive [tape] Rash 12/14/2012    Family History  Problem Relation Age of Onset  . Hypertension Mother   . Hypertension Father   . Diabetes Father   . Heart disease Father   . Cervical cancer Sister   . Stroke Sister   . Colon cancer Neg Hx   . Breast cancer Neg Hx   . Ovarian cancer Neg Hx     Social History   Socioeconomic History  . Marital status: Married    Spouse name: Edd Arbour  . Number of children: 3  . Years of education: 68  . Highest education level: High school graduate  Occupational History  . Not on file  Social Needs  . Financial resource strain: Not on file  . Food insecurity:    Worry: Not on file    Inability: Not on file  . Transportation needs:    Medical: Not on file    Non-medical: Not on file  Tobacco Use  . Smoking status: Never Smoker  . Smokeless tobacco: Never Used  Substance and Sexual Activity  . Alcohol use: Yes    Alcohol/week: 2.0 standard drinks    Types: 2 Glasses of wine per week  . Drug use: No  . Sexual activity: Yes    Partners: Male    Birth control/protection: Surgical  Lifestyle  . Physical activity:    Days per week: Not on file    Minutes per session: Not on file  . Stress: Not on file  Relationships  . Social connections:    Talks on phone: Not on file    Gets together: Not on file    Attends religious service: Not on file    Active member of club or organization: Not on file    Attends meetings of clubs or organizations: Not on file     Relationship status: Not on file  . Intimate partner violence:    Fear of current or ex partner: Not on file    Emotionally abused: Not on file    Physically abused: Not on file    Forced sexual activity: Not on file  Other Topics Concern  . Not on file  Social History Narrative   2 living children; one son deceased from overdose.    Review of Systems: See HPI, otherwise negative ROS  Physical Exam: BP (!) 143/69   Pulse (!) 57   Temp 98.1  F (36.7 C) (Temporal)   Resp 16   Ht 5\' 2"  (1.575 m)   Wt 97.5 kg   LMP 01/28/2007 (Approximate)   SpO2 97%   BMI 39.32 kg/m  General:   Alert,  pleasant and cooperative in NAD Head:  Normocephalic and atraumatic. Neck:  Supple; no masses or thyromegaly. Lungs:  Clear throughout to auscultation.    Heart:  Regular rate and rhythm. Abdomen:  Soft, nontender and nondistended. Normal bowel sounds, without guarding, and without rebound.   Neurologic:  Alert and  oriented x4;  grossly normal neurologically.  Impression/Plan: Olivia Dougherty is here for an colonoscopy to be performed for history of colon polyps  Risks, benefits, limitations, and alternatives regarding  colonoscopy have been reviewed with the patient.  Questions have been answered.  All parties agreeable.   Lucilla Lame, MD  12/31/2017, 7:31 AM

## 2017-12-31 NOTE — Anesthesia Postprocedure Evaluation (Signed)
Anesthesia Post Note  Patient: Olivia Dougherty  Procedure(s) Performed: COLONOSCOPY WITH PROPOFOL (N/A )  Patient location during evaluation: PACU Anesthesia Type: General Level of consciousness: awake and alert Pain management: pain level controlled Vital Signs Assessment: post-procedure vital signs reviewed and stable Respiratory status: spontaneous breathing, nonlabored ventilation, respiratory function stable and patient connected to nasal cannula oxygen Cardiovascular status: blood pressure returned to baseline and stable Postop Assessment: no apparent nausea or vomiting Anesthetic complications: no    Lenya Sterne

## 2017-12-31 NOTE — Transfer of Care (Signed)
Immediate Anesthesia Transfer of Care Note  Patient: Olivia Dougherty  Procedure(s) Performed: COLONOSCOPY WITH PROPOFOL (N/A )  Patient Location: PACU  Anesthesia Type: General  Level of Consciousness: awake, alert  and patient cooperative  Airway and Oxygen Therapy: Patient Spontanous Breathing and Patient connected to supplemental oxygen  Post-op Assessment: Post-op Vital signs reviewed, Patient's Cardiovascular Status Stable, Respiratory Function Stable, Patent Airway and No signs of Nausea or vomiting  Post-op Vital Signs: Reviewed and stable  Complications: No apparent anesthesia complications

## 2017-12-31 NOTE — Op Note (Signed)
[facility] Patient Name: Olivia Dougherty Procedure Date: 12/31/2017 7:33 AM MRN: [id number] Account #: [id number] Date of Birth: 1950/12/17 Admit Type: Outpatient Age: 67 Room: [facility] OR ROOM 01 Gender: Female Note Status: Finalized Procedure:            Colonoscopy Indications:          High risk colon cancer surveillance: Personal history                        of colonic polyps Providers:            Lucilla Lame MD, MD Referring MD:         Dionne Bucy. Bacigalupo (Referring MD) Medicines:            Propofol per Anesthesia Complications:        No immediate complications. Procedure:            Pre-Anesthesia Assessment:                       - Prior to the procedure, a History and Physical was                        performed, and patient medications and allergies were                        reviewed. The patient's tolerance of previous                        anesthesia was also reviewed. The risks and benefits of                        the procedure and the sedation options and risks were                        discussed with the patient. All questions were                        answered, and informed consent was obtained. Prior                        Anticoagulants: The patient has taken no previous                        anticoagulant or antiplatelet agents. ASA Grade                        Assessment: II - A patient with mild systemic disease.                        After reviewing the risks and benefits, the patient was                        deemed in satisfactory condition to undergo the                        procedure.                       After obtaining informed consent, the colonoscope was  passed under direct vision. Throughout the procedure,                        the patient's blood pressure, pulse, and oxygen                        saturations were monitored continuously. The was   introduced through the anus and advanced to the the                        cecum, identified by appendiceal orifice and ileocecal                        valve. The colonoscopy was performed without                        difficulty. The patient tolerated the procedure well.                        The quality of the bowel preparation was good. Findings:      The perianal and digital rectal examinations were normal.      A 3 mm polyp was found in the ascending colon. The polyp was sessile.       The polyp was removed with a cold biopsy forceps. Resection and       retrieval were complete.      Multiple small-mouthed diverticula were found in the sigmoid colon.      Non-bleeding internal hemorrhoids were found during retroflexion. The       hemorrhoids were Grade I (internal hemorrhoids that do not prolapse). Impression:           - One 3 mm polyp in the ascending colon, removed with a                        cold biopsy forceps. Resected and retrieved.                       - Diverticulosis in the sigmoid colon.                       - Non-bleeding internal hemorrhoids. Recommendation:       - Discharge patient to home.                       - Resume previous diet.                       - Continue present medications.                       - Await pathology results.                       - Repeat colonoscopy in 5 years for surveillance. Procedure Code(s):    --- Professional ---                       [phone], Colonoscopy, flexible; with biopsy, single or                        multiple Diagnosis Code(s):    --- Professional ---  Z86.010, Personal history of colonic polyps                       D12.2, Benign neoplasm of ascending colon CPT copyright 2018 American Medical Association. All rights reserved. The codes documented in this report are preliminary and upon coder review may  be revised to meet current compliance requirements. Lucilla Lame MD, MD 12/31/2017 8:21:46  AM This report has been signed electronically. Number of Addenda: 0 Note Initiated On: 12/31/2017 7:33 AM Scope Withdrawal Time: 0 hours 6 minutes 54 seconds  Total Procedure Duration: 0 hours 11 minutes 34 seconds       [facility]

## 2017-12-31 NOTE — Anesthesia Procedure Notes (Signed)
Date/Time: 12/31/2017 8:02 AM Performed by: Cameron Ali, CRNA Pre-anesthesia Checklist: Patient identified, Emergency Drugs available, Suction available, Timeout performed and Patient being monitored Patient Re-evaluated:Patient Re-evaluated prior to induction Oxygen Delivery Method: Nasal cannula Placement Confirmation: positive ETCO2

## 2018-01-01 ENCOUNTER — Encounter: Payer: Self-pay | Admitting: Gastroenterology

## 2018-01-01 ENCOUNTER — Other Ambulatory Visit: Payer: Self-pay | Admitting: Family Medicine

## 2018-01-04 ENCOUNTER — Encounter: Payer: Self-pay | Admitting: Gastroenterology

## 2018-01-04 DIAGNOSIS — M545 Low back pain: Secondary | ICD-10-CM | POA: Diagnosis not present

## 2018-01-06 ENCOUNTER — Encounter: Payer: Self-pay | Admitting: Gastroenterology

## 2018-01-07 DIAGNOSIS — M545 Low back pain: Secondary | ICD-10-CM | POA: Diagnosis not present

## 2018-02-23 ENCOUNTER — Other Ambulatory Visit: Payer: Self-pay | Admitting: Obstetrics & Gynecology

## 2018-02-23 DIAGNOSIS — Z1231 Encounter for screening mammogram for malignant neoplasm of breast: Secondary | ICD-10-CM

## 2018-02-27 HISTORY — PX: SKIN CANCER EXCISION: SHX779

## 2018-03-25 DIAGNOSIS — D485 Neoplasm of uncertain behavior of skin: Secondary | ICD-10-CM | POA: Diagnosis not present

## 2018-03-25 DIAGNOSIS — D2262 Melanocytic nevi of left upper limb, including shoulder: Secondary | ICD-10-CM | POA: Diagnosis not present

## 2018-03-25 DIAGNOSIS — Z85828 Personal history of other malignant neoplasm of skin: Secondary | ICD-10-CM | POA: Diagnosis not present

## 2018-03-25 DIAGNOSIS — D2261 Melanocytic nevi of right upper limb, including shoulder: Secondary | ICD-10-CM | POA: Diagnosis not present

## 2018-03-25 DIAGNOSIS — L821 Other seborrheic keratosis: Secondary | ICD-10-CM | POA: Diagnosis not present

## 2018-03-25 DIAGNOSIS — D2272 Melanocytic nevi of left lower limb, including hip: Secondary | ICD-10-CM | POA: Diagnosis not present

## 2018-03-25 DIAGNOSIS — L570 Actinic keratosis: Secondary | ICD-10-CM | POA: Diagnosis not present

## 2018-03-25 DIAGNOSIS — Z872 Personal history of diseases of the skin and subcutaneous tissue: Secondary | ICD-10-CM | POA: Diagnosis not present

## 2018-03-25 DIAGNOSIS — Z08 Encounter for follow-up examination after completed treatment for malignant neoplasm: Secondary | ICD-10-CM | POA: Diagnosis not present

## 2018-04-01 DIAGNOSIS — L570 Actinic keratosis: Secondary | ICD-10-CM | POA: Diagnosis not present

## 2018-04-04 ENCOUNTER — Other Ambulatory Visit: Payer: Self-pay | Admitting: Cardiovascular Disease

## 2018-04-16 ENCOUNTER — Ambulatory Visit: Payer: PRIVATE HEALTH INSURANCE

## 2018-04-16 ENCOUNTER — Ambulatory Visit: Payer: BLUE CROSS/BLUE SHIELD | Admitting: Obstetrics & Gynecology

## 2018-05-09 ENCOUNTER — Other Ambulatory Visit: Payer: Self-pay | Admitting: Family Medicine

## 2018-05-09 ENCOUNTER — Other Ambulatory Visit: Payer: Self-pay | Admitting: Obstetrics & Gynecology

## 2018-05-10 NOTE — Telephone Encounter (Signed)
Medication refill request: Vitamin D Last AEX:  02/06/17 SM Next AEX: none scheduled Last MMG (if hormonal medication request): 02/06/17 BIRADS 1 negative/density b Refill authorized: 02/26/17 #6 w/4 refills; today please advise; Order pended #6 w/2 refills if authorized

## 2018-05-10 NOTE — Telephone Encounter (Signed)
Please Review

## 2018-06-01 ENCOUNTER — Ambulatory Visit: Payer: Self-pay | Admitting: Family Medicine

## 2018-07-09 ENCOUNTER — Other Ambulatory Visit: Payer: Self-pay | Admitting: Family Medicine

## 2018-07-20 ENCOUNTER — Other Ambulatory Visit: Payer: Self-pay

## 2018-07-20 NOTE — Telephone Encounter (Signed)
Medication refill request: Vitamin D2  Last AEX:  02/06/17 Next AEX: 11/01/18 Last MMG (if hormonal medication request): 02/06/17 Refill authorized: #6 with 2 rf

## 2018-07-21 ENCOUNTER — Other Ambulatory Visit: Payer: Self-pay | Admitting: *Deleted

## 2018-07-21 NOTE — Telephone Encounter (Addendum)
Request already routed to provider. See encounter dated 07-20-2018.

## 2018-07-23 MED ORDER — VITAMIN D (ERGOCALCIFEROL) 1.25 MG (50000 UNIT) PO CAPS: ORAL_CAPSULE | ORAL | 1 refills | Status: DC

## 2018-09-06 ENCOUNTER — Telehealth: Payer: Self-pay

## 2018-09-06 NOTE — Telephone Encounter (Signed)
Patient called stating that appointments scheduled in November will work for her.

## 2018-09-16 ENCOUNTER — Other Ambulatory Visit: Payer: Self-pay | Admitting: Family Medicine

## 2018-10-06 ENCOUNTER — Ambulatory Visit (INDEPENDENT_AMBULATORY_CARE_PROVIDER_SITE_OTHER): Payer: Medicare Other

## 2018-10-06 ENCOUNTER — Other Ambulatory Visit: Payer: Self-pay

## 2018-10-06 DIAGNOSIS — Z23 Encounter for immunization: Secondary | ICD-10-CM

## 2018-10-10 ENCOUNTER — Other Ambulatory Visit: Payer: Self-pay | Admitting: Cardiovascular Disease

## 2018-10-12 ENCOUNTER — Other Ambulatory Visit: Payer: Self-pay

## 2018-10-12 ENCOUNTER — Ambulatory Visit (INDEPENDENT_AMBULATORY_CARE_PROVIDER_SITE_OTHER): Payer: Medicare Other | Admitting: Cardiovascular Disease

## 2018-10-12 ENCOUNTER — Encounter: Payer: Self-pay | Admitting: Cardiovascular Disease

## 2018-10-12 VITALS — BP 132/76 | HR 57 | Ht 62.0 in | Wt 223.8 lb

## 2018-10-12 DIAGNOSIS — E7800 Pure hypercholesterolemia, unspecified: Secondary | ICD-10-CM

## 2018-10-12 DIAGNOSIS — I2510 Atherosclerotic heart disease of native coronary artery without angina pectoris: Secondary | ICD-10-CM | POA: Diagnosis not present

## 2018-10-12 DIAGNOSIS — I10 Essential (primary) hypertension: Secondary | ICD-10-CM | POA: Diagnosis not present

## 2018-10-12 NOTE — Patient Instructions (Signed)
Medication Instructions:  Your physician recommends that you continue on your current medications as directed. Please refer to the Current Medication list given to you today.  If you need a refill on your cardiac medications before your next appointment, please call your pharmacy.   Lab work: None ordered If you have labs (blood work) drawn today and your tests are completely normal, you will receive your results only by: . MyChart Message (if you have MyChart) OR . A paper copy in the mail If you have any lab test that is abnormal or we need to change your treatment, we will call you to review the results.  Testing/Procedures: None ordered  Follow-Up: At [facility], you and your health needs are our priority.  As part of our continuing mission to provide you with exceptional heart care, we have created designated Provider Care Teams.  These Care Teams include your primary Cardiologist (physician) and Advanced Practice Providers (APPs -  Physician Assistants and Nurse Practitioners) who all work together to provide you with the care you need, when you need it. You will need a follow up appointment in 12 months.  Please call our office 2 months in advance to schedule this appointment.  You may see  Dr. Arida or one of the following Advanced Practice Providers on your designated Care Team:   Christopher Berge, NP Ryan Dunn, PA-C . Jacquelyn Visser, PA-C  Any Other Special Instructions Will Be Listed Below (If Applicable). N/A   

## 2018-10-12 NOTE — Progress Notes (Signed)
Cardiology Office Note   Date:  10/12/2018   ID:  Olivia Dougherty, DOB 1950-08-03, MRN [id number]  PCP:  Virginia Crews, MD  Cardiologist:   Kathlyn Sacramento, MD   Chief Complaint  Patient presents with  . other    12 mo f/u.Medications reviewed verbally.      History of Present Illness: Olivia Dougherty is a 68 y.o. female who is here today for follow-up visit regarding coronary artery disease.  She underwent LAD PCI and Cypher drug-eluting stent placement with balloon angioplasty of diagonal in 2005 by Dr. Rollene Fare.  No cardiac events since then.  Cardiac catheterization in 2010 showed patent stent with minimal restenosis. She has known history of hyperlipidemia with intolerance to most statins due to myalgia.  She had CTA of the coronary arteries in 2017 which showed minimal plaque with no obstructive disease and patent LAD stent. She has been doing well with no recent chest pain, shortness of breath or palpitations.  She has not been going outside of her house much because of COVID and she is not as active as before and as a result she gained some weight.  She is taking rosuvastatin without significant myalgia.   Past Medical History:  Diagnosis Date  . Anxiety   . Arthritis    lower back  . Basal cell carcinoma 11/2011   face, left leg-using chemo cream on temple area  . Coronary artery disease 2005   LAD PCI with Cypher drug-eluting stent placement 3.0 x 13 mm with balloon angioplasty of diagonal.  Cardiac catheterization in 2010 showed patent stent with minimal restenosis.  Marland Kitchen GERD (gastroesophageal reflux disease)   . Heart disease   . Heart problem    98% blockage  . Hypercholesteremia   . Hypertension   . Hypothyroidism   . Kidney stone   . Osteoarthritis   . PONV (postoperative nausea and vomiting)    after 1 procedure approx 10 yrs ago  . Sleep apnea    mild, could not tol cpap    Past Surgical History:  Procedure Laterality Date  . ANGIOPLASTY  2005    CAD   98% blockage  . CARDIAC CATHETERIZATION  02/2008  . COLONOSCOPY WITH PROPOFOL N/A 12/29/2014   Procedure: COLONOSCOPY WITH PROPOFOL;  Surgeon: Lucilla Lame, MD;  Location: [place];  Service: Endoscopy;  Laterality: N/A;  . COLONOSCOPY WITH PROPOFOL N/A 12/31/2017   Procedure: COLONOSCOPY WITH PROPOFOL;  Surgeon: Lucilla Lame, MD;  Location: [place];  Service: Endoscopy;  Laterality: N/A;  sleep apnea  . CORONARY ANGIOPLASTY  2005   LAD  . HYSTEROSCOPY  11/2001   resection of benign leiomyoma  . KNEE ARTHROSCOPY Right 08/16/2014   Procedure: ARTHROSCOPY KNEE, partial lateral menisectomy and chondroplasty;  Surgeon: Dereck Leep, MD;  Location: [facility];  Service: Orthopedics;  Laterality: Right;  . POLYPECTOMY  12/29/2014   Procedure: POLYPECTOMY;  Surgeon: Lucilla Lame, MD;  Location: [place];  Service: Endoscopy;;  . THYROID CYST EXCISION Left 10/2002   left lobe thyroid excision  . THYROIDECTOMY, PARTIAL Left   . TUBAL LIGATION  1980   BTL  . UMBILICAL HERNIA REPAIR  1980  . vulvar cancer  1993   laser     Current Outpatient Medications  Medication Sig Dispense Refill  . aspirin 81 MG tablet Take 81 mg by mouth daily.    . clopidogrel (PLAVIX) 75 MG tablet Take 1 tablet (75 mg total) by mouth daily  with breakfast. 90 tablet 3  . cyclobenzaprine (FLEXERIL) 5 MG tablet TAKE ONE TABLET BY MOUTH THREE TIMES A DAY AS NEEDED FOR MUSCLE SPASMS 30 tablet 0  . fluticasone (FLONASE) 50 MCG/ACT nasal spray Place 2 sprays into both nostrils daily.    Marland Kitchen levothyroxine (SYNTHROID) 75 MCG tablet TAKE ONE TABLET BY MOUTH DAILY BEFORE BREAKFAST 30 tablet 2  . loratadine (CLARITIN) 10 MG tablet Take 10 mg by mouth daily.    Marland Kitchen losartan (COZAAR) 100 MG tablet Take 1 tablet (100 mg total) by mouth daily. 90 tablet 3  . meloxicam (MOBIC) 7.5 MG tablet Take 7.5 mg by mouth 2 (two) times daily.    . metoprolol tartrate (LOPRESSOR) 25 MG tablet Take 1 tablet (25 mg  total) by mouth 2 (two) times daily. 180 tablet 3  . Multiple Vitamins-Minerals (MULTIVITAMIN PO) Take by mouth daily.    . rosuvastatin (CRESTOR) 10 MG tablet TAKE ONE TABLET BY MOUTH DAILY 90 tablet 0  . Vitamin D, Ergocalciferol, (DRISDOL) 1.25 MG (50000 UT) CAPS capsule TAKE ONE CAPSULE EVERY 14 DAYS 6 capsule 1   No current facility-administered medications for this visit.     Allergies:   Atorvastatin, Codeine, Ace inhibitors, Enalapril, Penicillins, Sulfa antibiotics, and Adhesive [tape]    Social History:  The patient  reports that she has never smoked. She has never used smokeless tobacco. She reports current alcohol use of about 2.0 standard drinks of alcohol per week. She reports that she does not use drugs.   Family History:  The patient's family history includes Cervical cancer in her sister; Diabetes in her father; Heart disease in her father; Hypertension in her father and mother; Stroke in her sister.    ROS:  Please see the history of present illness.   Otherwise, review of systems are positive for none.   All other systems are reviewed and negative.    PHYSICAL EXAM: VS:  BP 132/76 (BP Location: Left Arm, Patient Position: Sitting, Cuff Size: Normal)   Pulse (!) 57   Ht 5\' 2"  (1.575 m)   Wt 223 lb 12.8 oz (101.5 kg)   LMP 01/28/2007 (Approximate)   BMI 40.93 kg/m  , BMI Body mass index is 40.93 kg/m. GEN: Well nourished, well developed, in no acute distress  HEENT: normal  Neck: no JVD, carotid bruits, or masses Cardiac: RRR; no  rubs, or gallops,no edema .  1 / 6 systolic ejection murmur in the aortic area Respiratory:  clear to auscultation bilaterally, normal work of breathing GI: soft, nontender, nondistended, + BS MS: no deformity or atrophy  Skin: warm and dry, no rash Neuro:  Strength and sensation are intact Psych: euthymic mood, full affect   EKG:  EKG is ordered today. The ekg ordered today demonstrates sinus bradycardia with no significant ST or T  wave changes.   Recent Labs: 12/01/2017: BUN 15; Creatinine, Ser 0.85; Hemoglobin 13.9; Platelets 267; Potassium 4.2; Sodium 139; TSH 0.951    Lipid Panel    Component Value Date/Time   CHOL 158 05/21/2017 0957   TRIG 110 05/21/2017 0957   HDL 57 05/21/2017 0957   CHOLHDL 2.8 05/21/2017 0957   VLDL 22 05/21/2017 0957   LDLCALC 79 05/21/2017 0957      Wt Readings from Last 3 Encounters:  10/12/18 223 lb 12.8 oz (101.5 kg)  12/31/17 215 lb (97.5 kg)  12/01/17 218 lb 12.8 oz (99.2 kg)       No flowsheet data found.  ASSESSMENT AND PLAN:  1.  Coronary artery disease involving native coronary arteries without angina: Given that she had first generation drug-eluting stent to the LAD, I favor continued lifelong dual antiplatelet therapy as tolerated.  She has no anginal symptoms at the present time.  2.  Hyperlipidemia with intolerance to statins.  She is tolerating rosuvastatin without significant myalgia.  Most recent LDL was 79.  She is going to have repeat labs with her physical later this year.  I explained to her that we need to get her LDL below 70.  Not able to increase the dose of rosuvastatin due to myalgia on higher doses.  The plan is to add Zetia if LDL remains above 70.  3.  Essential hypertension: Blood pressure is controlled on current medications.  4.  Weight gain: Decreased physical activities as she has not been as active due to fear of COVID.  I discussed with her the importance of increasing her physical activities and exercise.  [place] more walking there.  Disposition:   FU with me in 12 months  Signed,  Kathlyn Sacramento, MD  10/12/2018 4:28 PM    [place]

## 2018-10-17 ENCOUNTER — Other Ambulatory Visit: Payer: Self-pay | Admitting: Cardiovascular Disease

## 2018-10-29 ENCOUNTER — Other Ambulatory Visit: Payer: Self-pay

## 2018-11-01 ENCOUNTER — Ambulatory Visit
Admission: RE | Admit: 2018-11-01 | Discharge: 2018-11-01 | Disposition: A | Discharge status: Home / Self Care | Payer: Medicare Other | Source: Ambulatory Visit | Attending: Obstetrics & Gynecology | Admitting: Obstetrics & Gynecology

## 2018-11-01 ENCOUNTER — Other Ambulatory Visit (HOSPITAL_COMMUNITY)
Admission: RE | Admit: 2018-11-01 | Discharge: 2018-11-01 | Disposition: A | Discharge status: Home / Self Care | Payer: Medicare Other | Source: Ambulatory Visit | Attending: Obstetrics & Gynecology | Admitting: Obstetrics & Gynecology

## 2018-11-01 ENCOUNTER — Encounter: Payer: Self-pay | Admitting: Obstetrics & Gynecology

## 2018-11-01 ENCOUNTER — Ambulatory Visit (INDEPENDENT_AMBULATORY_CARE_PROVIDER_SITE_OTHER): Payer: Medicare Other | Admitting: Obstetrics & Gynecology

## 2018-11-01 ENCOUNTER — Other Ambulatory Visit: Payer: Self-pay

## 2018-11-01 VITALS — BP 130/62 | HR 68 | Temp 97.4°F | Ht 62.0 in | Wt 224.0 lb

## 2018-11-01 DIAGNOSIS — Z1231 Encounter for screening mammogram for malignant neoplasm of breast: Secondary | ICD-10-CM | POA: Diagnosis not present

## 2018-11-01 DIAGNOSIS — Z124 Encounter for screening for malignant neoplasm of cervix: Secondary | ICD-10-CM | POA: Insufficient documentation

## 2018-11-01 DIAGNOSIS — R102 Pelvic and perineal pain: Secondary | ICD-10-CM

## 2018-11-01 DIAGNOSIS — Z01411 Encounter for gynecological examination (general) (routine) with abnormal findings: Secondary | ICD-10-CM

## 2018-11-01 NOTE — Progress Notes (Signed)
68 y.o. G68P3003 Married White or Caucasian female here for annual exam.  Denies vaginal bleeding.  Has gone on Medicare this past year.  Frustrated with medicare.  Reports she has experienced an intermittent throb that lasts 10-15 minutes.  This is intermittent and has been going on for a few months.  Sometimes an ice pack on her vulva seems to help.  Denies pain with intercourse.  Has medicare wellness exam scheduled.  Will do the pneumovax with this visit.  Has already gotten her flu shot.  Patient's last menstrual period was 01/28/2007 (approximate).          Sexually active: Yes The current method of family planning is post menopausal status.    Exercising: No Smoker:  no  Health Maintenance: Pap:  02/04/16 Neg. HR HPV:neg   01/13/14 Neg  History of abnormal Pap:  no MMG:  11/01/18  Colonoscopy:  12/31/17 hyperplastic polyp. F/u 5 years.  Dr. Verl Blalock. BMD:   10/15/17 Normal TDaP:  2012 Pneumonia vaccine(s):  2019 Shingrix:   Completed  Hep C testing: 01/17/15 neg  Screening Labs: PCP   reports that she has never smoked. She has never used smokeless tobacco. She reports current alcohol use of about 2.0 standard drinks of alcohol per week. She reports that she does not use drugs.  Past Medical History:  Diagnosis Date  . Anxiety   . Arthritis    lower back  . Basal cell carcinoma 11/2011   face, left leg-using chemo cream on temple area  . Coronary artery disease 2005   LAD PCI with Cypher drug-eluting stent placement 3.0 x 13 mm with balloon angioplasty of diagonal.  Cardiac catheterization in 2010 showed patent stent with minimal restenosis.  Marland Kitchen GERD (gastroesophageal reflux disease)   . Heart disease   . Heart problem    98% blockage  . Hypercholesteremia   . Hypertension   . Hypothyroidism   . Kidney stone   . Osteoarthritis   . PONV (postoperative nausea and vomiting)    after 1 procedure approx 10 yrs ago  . Sleep apnea    mild, could not tol cpap  . Squamous cell  carcinoma of skin    leg    Past Surgical History:  Procedure Laterality Date  . ANGIOPLASTY  2005   CAD   98% blockage  . CARDIAC CATHETERIZATION  02/2008  . COLONOSCOPY WITH PROPOFOL N/A 12/29/2014   Procedure: COLONOSCOPY WITH PROPOFOL;  Surgeon: Lucilla Lame, MD;  Location: [place];  Service: Endoscopy;  Laterality: N/A;  . COLONOSCOPY WITH PROPOFOL N/A 12/31/2017   Procedure: COLONOSCOPY WITH PROPOFOL;  Surgeon: Lucilla Lame, MD;  Location: [place];  Service: Endoscopy;  Laterality: N/A;  sleep apnea  . CORONARY ANGIOPLASTY  2005   LAD  . HYSTEROSCOPY  11/2001   resection of benign leiomyoma  . KNEE ARTHROSCOPY Right 08/16/2014   Procedure: ARTHROSCOPY KNEE, partial lateral menisectomy and chondroplasty;  Surgeon: Dereck Leep, MD;  Location: [facility];  Service: Orthopedics;  Laterality: Right;  . POLYPECTOMY  12/29/2014   Procedure: POLYPECTOMY;  Surgeon: Lucilla Lame, MD;  Location: [place];  Service: Endoscopy;;  . SKIN CANCER EXCISION Right 02/2018   Right leg - Squamous   . THYROID CYST EXCISION Left 10/2002   left lobe thyroid excision  . THYROIDECTOMY, PARTIAL Left   . TUBAL LIGATION  1980   BTL  . UMBILICAL HERNIA REPAIR  1980  . vulvar cancer  1993   laser  Current Outpatient Medications  Medication Sig Dispense Refill  . aspirin 81 MG tablet Take 81 mg by mouth daily.    . clopidogrel (PLAVIX) 75 MG tablet Take 1 tablet (75 mg total) by mouth daily with breakfast. 90 tablet 3  . cyclobenzaprine (FLEXERIL) 5 MG tablet TAKE ONE TABLET BY MOUTH THREE TIMES A DAY AS NEEDED FOR MUSCLE SPASMS 30 tablet 0  . fluticasone (FLONASE) 50 MCG/ACT nasal spray Place 2 sprays into both nostrils daily.    Marland Kitchen levothyroxine (SYNTHROID) 75 MCG tablet TAKE ONE TABLET BY MOUTH DAILY BEFORE BREAKFAST 30 tablet 2  . loratadine (CLARITIN) 10 MG tablet Take 10 mg by mouth daily.    Marland Kitchen losartan (COZAAR) 50 MG tablet TAKE TWO TABLETS BY MOUTH DAILY 180  tablet 3  . meloxicam (MOBIC) 7.5 MG tablet Take 7.5 mg by mouth 2 (two) times daily.    . metoprolol tartrate (LOPRESSOR) 25 MG tablet Take 1 tablet (25 mg total) by mouth 2 (two) times daily. 180 tablet 3  . Multiple Vitamins-Minerals (MULTIVITAMIN PO) Take by mouth daily.    . rosuvastatin (CRESTOR) 10 MG tablet TAKE ONE TABLET BY MOUTH DAILY 90 tablet 0  . Vitamin D, Ergocalciferol, (DRISDOL) 1.25 MG (50000 UT) CAPS capsule TAKE ONE CAPSULE EVERY 14 DAYS 6 capsule 1   No current facility-administered medications for this visit.     Family History  Problem Relation Age of Onset  . Hypertension Mother   . Hypertension Father   . Diabetes Father   . Heart disease Father   . Cervical cancer Sister   . Stroke Sister   . Colon cancer Neg Hx   . Breast cancer Neg Hx   . Ovarian cancer Neg Hx     Review of Systems  Genitourinary: Positive for pelvic pain (on and off throbbing pain ).  All other systems reviewed and are negative.    Exam:   BP 130/62   Pulse 68   Temp (!) 97.4 F (36.3 C) (Temporal)   Ht 5\' 2"  (1.575 m)   Wt 224 lb (101.6 kg)   LMP 01/28/2007 (Approximate)   BMI 40.97 kg/m     Height: 5\' 2"  (157.5 cm)  Ht Readings from Last 3 Encounters:  11/01/18 5\' 2"  (1.575 m)  10/12/18 5\' 2"  (1.575 m)  12/31/17 5\' 2"  (1.575 m)    General appearance: alert, cooperative and appears stated age Head: Normocephalic, without obvious abnormality, atraumatic Neck: no adenopathy, supple, symmetrical, trachea midline and thyroid normal to inspection and palpation Lungs: clear to auscultation bilaterally Breasts: normal appearance, no masses or tenderness Heart: regular rate and rhythm Abdomen: soft, non-tender; bowel sounds normal; no masses,  no organomegaly Extremities: extremities normal, atraumatic, no cyanosis or edema Skin: Skin color, texture, turgor normal. No rashes or lesions Lymph nodes: Cervical, supraclavicular, and axillary nodes normal. No abnormal inguinal  nodes palpated Neurologic: Grossly normal   Pelvic: External genitalia:  no lesions              Urethra:  normal appearing urethra with no masses, tenderness or lesions              Bartholins and Skenes: normal                 Vagina: normal appearing vagina with normal color and discharge, no lesions              Cervix: no lesions  Pap taken: No. Bimanual Exam:  Uterus:  normal size, contour, position, consistency, mobility, non-tender              Adnexa: normal adnexa               Rectovaginal: Confirms               Anus:  normal sphincter tone, no lesions  Chaperone was present for exam.  A:  Well Woman with normal exam PMP, no HRT H/o CAD with drug-eluting stent (he recommends lifelong antiplatelet therapy) Hypertension Elevated lipids (goal for LDLs <70) H/o BCC/SCC H/o vulvar dysplasia, 1993 Vit D deficiency  P:   Mammogram guidelines reviewed.  Just done today. pap smear with neg HR HPV 1/18 Lab work is UTD Colonoscopy UTD BMD is UTD Pt is going to return for PUS Will have Vit D level done in November with Dr. Brita Romp return annually or prn

## 2018-11-03 ENCOUNTER — Telehealth: Payer: Self-pay | Admitting: Obstetrics & Gynecology

## 2018-11-03 LAB — CYTOLOGY - PAP: Diagnosis: NEGATIVE

## 2018-11-03 NOTE — Telephone Encounter (Signed)
Call placed to review benefit for an ultrasound. Left voicemail message requesting a return call

## 2018-11-08 ENCOUNTER — Other Ambulatory Visit: Payer: Self-pay | Admitting: Cardiovascular Disease

## 2018-11-08 NOTE — Telephone Encounter (Signed)
Call placed to patient to review benefit and schedule recommended ultrasound with Dr Sabra Heck. Left voicemail message requesting a return call.

## 2018-11-08 NOTE — Telephone Encounter (Signed)
Patient returned call

## 2018-11-09 NOTE — Telephone Encounter (Signed)
Returned call to patient. Left voicemail message requesting a return call to [place], or after 1:30 to [place]   cc: Thayer Ohm

## 2018-11-09 NOTE — Telephone Encounter (Signed)
Patient returned call. Reviewed benefit for recommended ultrasound. Patient acknowledges understanding of information presented. Patient is scheduled 11/11/2018 with Dr Sabra Heck. Patient is aware of the appointment date, arrival time and cancellation policy. No further questions

## 2018-11-11 ENCOUNTER — Other Ambulatory Visit: Payer: Self-pay

## 2018-11-11 ENCOUNTER — Ambulatory Visit (INDEPENDENT_AMBULATORY_CARE_PROVIDER_SITE_OTHER): Payer: Medicare Other

## 2018-11-11 ENCOUNTER — Ambulatory Visit (INDEPENDENT_AMBULATORY_CARE_PROVIDER_SITE_OTHER): Payer: Medicare Other | Admitting: Obstetrics & Gynecology

## 2018-11-11 ENCOUNTER — Encounter: Payer: Self-pay | Admitting: Obstetrics & Gynecology

## 2018-11-11 VITALS — BP 150/72 | HR 68 | Temp 97.2°F | Ht 62.0 in | Wt 225.0 lb

## 2018-11-11 DIAGNOSIS — D399 Neoplasm of uncertain behavior of female genital organ, unspecified: Secondary | ICD-10-CM | POA: Diagnosis not present

## 2018-11-11 DIAGNOSIS — R102 Pelvic and perineal pain: Secondary | ICD-10-CM

## 2018-11-11 DIAGNOSIS — N83201 Unspecified ovarian cyst, right side: Secondary | ICD-10-CM

## 2018-11-11 DIAGNOSIS — I2510 Atherosclerotic heart disease of native coronary artery without angina pectoris: Secondary | ICD-10-CM | POA: Diagnosis not present

## 2018-11-11 NOTE — Progress Notes (Signed)
68 y.o. G45P3003 Married White or Caucasian female here for pelvic ultrasound due to pelvic throbbing sensation that has been occurring intermittently for a few months.  Occasionally this is also low and involves the labia.  She feels like an ice pack does help at times.  Has not had any vaginal bleeding.    Patient's last menstrual period was 01/28/2007 (approximate).  Contraception: PMP  Findings:  UTERUS: 6.0 x 4.3 x 2.8cm EMS: 2.8 - 3.64mm ADNEXA: Left ovary:  1.1 x 0.8 x 0.9cm       Right ovary: 1.4 x 1.0 x 1.2cm with 1.1 x 1.1 x 1.1cm simple appearing, avascular cyst CUL DE SAC: no free fluid  Discussion:  Findings reviewed.  Up to Date recommendations reviewed.  Although this appears completley benign, will obtain ca-125 today and repeat PUS in 6 months.  If there is any increase in size, will continue to monitor.  Pt comfortable with plan.  Aware that this does not explain the symptoms she is experiencing.  Pelvic exam at [facility] 11/01/2018 was normal.  She knows to call back with any changes in symptoms.    Assessment:  Right ovarian cyst in PMP female  Plan:  Ca-125 obtained today Repeat PUS in 6 months.  ~15 minutes spent with patient >50% of time was in face to face discussion of above.

## 2018-11-13 LAB — CA 125: Cancer Antigen (CA) 125: 7 U/mL (ref 0.0–38.1)

## 2018-11-16 ENCOUNTER — Ambulatory Visit: Payer: PRIVATE HEALTH INSURANCE

## 2018-11-24 ENCOUNTER — Other Ambulatory Visit: Payer: Self-pay | Admitting: Family Medicine

## 2018-11-29 ENCOUNTER — Ambulatory Visit: Payer: Medicare Other

## 2018-12-06 NOTE — Progress Notes (Signed)
Subjective:   Olivia Dougherty is a 68 y.o. female who presents for an Initial Medicare Annual Wellness Visit.     This visit is being conducted through telemedicine due to the COVID-19 pandemic. This patient has given me verbal consent via doximity to conduct this visit, patient states they are participating from their home address. Some vital signs may be absent or patient reported.    Patient identification: identified by name, DOB, and current address  Review of Systems    N/A   Cardiac Risk Factors include: advanced age (>73men, >63 women);dyslipidemia;hypertension     Objective:    Today's Vitals   12/07/18 0835  PainSc: 0-No pain   There is no height or weight on file to calculate BMI. Unable to obtain vitals due to visit being conducted via telephonically.   Advanced Directives 12/07/2018 12/31/2017 12/29/2014 08/10/2014  Does Patient Have a Medical Advance Directive? Yes No No Yes  Type of Paramedic of [place];Living will - - -  Does patient want to make changes to medical advance directive? - - - No - Patient declined  Copy of [place] in Chart? No - copy requested - - -  Would patient like information on creating a medical advance directive? - No - Patient declined No - patient declined information -    Current Medications (verified) Outpatient Encounter Medications as of 12/07/2018  Medication Sig  . aspirin 81 MG tablet Take 81 mg by mouth daily.  . clopidogrel (PLAVIX) 75 MG tablet TAKE ONE TABLET BY MOUTH DAILY WITH BREAKFAST  . cyclobenzaprine (FLEXERIL) 5 MG tablet TAKE ONE TABLET BY MOUTH THREE TIMES A DAY AS NEEDED FOR MUSCLE SPASMS  . fluticasone (FLONASE) 50 MCG/ACT nasal spray Place 2 sprays into both nostrils daily. As needed / seasonal  . levothyroxine (SYNTHROID) 75 MCG tablet TAKE ONE TABLET BY MOUTH DAILY BEFORE BREAKFAST  . loratadine (CLARITIN) 10 MG tablet Take 10 mg by mouth daily as needed.    Marland Kitchen losartan (COZAAR) 50 MG tablet TAKE TWO TABLETS BY MOUTH DAILY  . meloxicam (MOBIC) 7.5 MG tablet Take 7.5 mg by mouth 2 (two) times daily. As needed  . metoprolol tartrate (LOPRESSOR) 25 MG tablet Take 1 tablet (25 mg total) by mouth 2 (two) times daily.  . Multiple Vitamins-Minerals (MULTIVITAMIN PO) Take by mouth daily.  . rosuvastatin (CRESTOR) 10 MG tablet TAKE ONE TABLET BY MOUTH DAILY  . Vitamin D, Ergocalciferol, (DRISDOL) 1.25 MG (50000 UT) CAPS capsule TAKE ONE CAPSULE EVERY 14 DAYS   No facility-administered encounter medications on file as of 12/07/2018.     Allergies (verified) Atorvastatin, Codeine, Ace inhibitors, Enalapril, Penicillins, Sulfa antibiotics, and Adhesive [tape]   History: Past Medical History:  Diagnosis Date  . Anxiety   . Arthritis    lower back  . Basal cell carcinoma 11/2011   face, left leg-using chemo cream on temple area  . Coronary artery disease 2005   LAD PCI with Cypher drug-eluting stent placement 3.0 x 13 mm with balloon angioplasty of diagonal.  Cardiac catheterization in 2010 showed patent stent with minimal restenosis.  Marland Kitchen GERD (gastroesophageal reflux disease)   . Heart disease   . Heart problem    98% blockage  . Hypercholesteremia   . Hypertension   . Hypothyroidism   . Kidney stone   . Osteoarthritis   . PONV (postoperative nausea and vomiting)    after 1 procedure approx 10 yrs ago  . Sleep apnea  mild, could not tol cpap  . Squamous cell carcinoma of skin    leg   Past Surgical History:  Procedure Laterality Date  . ANGIOPLASTY  2005   CAD   98% blockage  . CARDIAC CATHETERIZATION  02/2008  . COLONOSCOPY WITH PROPOFOL N/A 12/29/2014   Procedure: COLONOSCOPY WITH PROPOFOL;  Surgeon: Lucilla Lame, MD;  Location: [facility];  Service: Endoscopy;  Laterality: N/A;  . COLONOSCOPY WITH PROPOFOL N/A 12/31/2017   Procedure: COLONOSCOPY WITH PROPOFOL;  Surgeon: Lucilla Lame, MD;  Location: [facility];   Service: Endoscopy;  Laterality: N/A;  sleep apnea  . CORONARY ANGIOPLASTY  2005   LAD  . HYSTEROSCOPY  11/2001   resection of benign leiomyoma  . KNEE ARTHROSCOPY Right 08/16/2014   Procedure: ARTHROSCOPY KNEE, partial lateral menisectomy and chondroplasty;  Surgeon: Dereck Leep, MD;  Location: [facility];  Service: Orthopedics;  Laterality: Right;  . POLYPECTOMY  12/29/2014   Procedure: POLYPECTOMY;  Surgeon: Lucilla Lame, MD;  Location: [facility];  Service: Endoscopy;;  . SKIN CANCER EXCISION Right 02/2018   Right leg - Squamous   . THYROID CYST EXCISION Left 10/2002   left lobe thyroid excision  . THYROIDECTOMY, PARTIAL Left   . TUBAL LIGATION  1980   BTL  . UMBILICAL HERNIA REPAIR  1980  . vulvar cancer  1993   laser   Family History  Problem Relation Age of Onset  . Hypertension Mother   . Hypertension Father   . Diabetes Father   . Heart disease Father   . Cervical cancer Sister   . Stroke Sister   . Colon cancer Neg Hx   . Breast cancer Neg Hx   . Ovarian cancer Neg Hx    Social History   Socioeconomic History  . Marital status: Married    Spouse name: Edd Arbour  . Number of children: 3  . Years of education: 15  . Highest education level: High school graduate  Occupational History  . Not on file  Social Needs  . Financial resource strain: Not hard at all  . Food insecurity    Worry: Never true    Inability: Never true  . Transportation needs    Medical: No    Non-medical: No  Tobacco Use  . Smoking status: Never Smoker  . Smokeless tobacco: Never Used  Substance and Sexual Activity  . Alcohol use: Yes    Alcohol/week: 2.0 standard drinks    Types: 2 Glasses of wine per week  . Drug use: No  . Sexual activity: Yes    Partners: Male    Birth control/protection: Surgical  Lifestyle  . Physical activity    Days per week: 0 days    Minutes per session: 0 min  . Stress: Not at all  Relationships  . Social Herbalist on phone:  Patient refused    Gets together: Patient refused    Attends religious service: Patient refused    Active member of club or organization: Patient refused    Attends meetings of clubs or organizations: Patient refused    Relationship status: Patient refused  Other Topics Concern  . Not on file  Social History Narrative   2 living children; one son deceased from overdose.    Tobacco Counseling Counseling given: Not Answered   Clinical Intake:  Pre-visit preparation completed: Yes  Pain : No/denies pain Pain Score: 0-No pain     Nutritional Risks: None Diabetes: No  How  often do you need to have someone help you when you read instructions, pamphlets, or other written materials from your doctor or pharmacy?: 1 - Never  Interpreter Needed?: No  Information entered by :: [facility], LPN   Activities of Daily Living In your present state of health, do you have any difficulty performing the following activities: 12/07/2018 12/31/2017  Hearing? N N  Vision? N N  Difficulty concentrating or making decisions? N N  Walking or climbing stairs? Y N  Comment Occasionally due to back or knee pain. -  Dressing or bathing? N N  Doing errands, shopping? N -  Preparing Food and eating ? N -  Using the Toilet? N -  In the past six months, have you accidently leaked urine? N -  Do you have problems with loss of bowel control? N -  Managing your Medications? N -  Managing your Finances? N -  Housekeeping or managing your Housekeeping? N -  Some recent data might be hidden     Immunizations and Health Maintenance Immunization History  Administered Date(s) Administered  . Fluad Quad(high Dose 65+) 10/06/2018  . Influenza, High Dose Seasonal PF 10/21/2017  . Influenza-Unspecified 12/27/2014, 10/28/2015  . Pneumococcal Conjugate-13 08/10/2017  . Tdap 10/28/2010  . Zoster Recombinat (Shingrix) 08/19/2017, 12/09/2017   Health Maintenance Due  Topic Date Due  . PNA vac Low Risk  Adult (2 of 2 - PPSV23) 08/11/2018    Patient Care Team: Virginia Crews, MD as PCP - General (Family Medicine) Lucilla Lame, MD as Consulting Physician (Gastroenterology) Marry Guan, Laurice Record, MD (Orthopedic Surgery) Oneta Rack, MD (Dermatology)  Indicate any recent Medical Services you may have received from other than [facility] providers in the past year (date may be approximate).     Assessment:   This is a routine wellness examination for Maxiene.  Hearing/Vision screen No exam data present  Dietary issues and exercise activities discussed: Current Exercise Habits: The patient does not participate in regular exercise at present, Exercise limited by: orthopedic condition(s)  Goals    . DIET - DECREASE SODA OR JUICE INTAKE     Recommend to continue cutting back on soda in take to 1 or less a day.       Depression Screen PHQ 2/9 Scores 12/07/2018 08/10/2017  PHQ - 2 Score 0 0    Fall Risk Fall Risk  12/07/2018 08/10/2017  Falls in the past year? 0 No  Number falls in past yr: 0 -  Injury with Fall? 0 -   FALL RISK PREVENTION PERTAINING TO THE HOME:  Any stairs in or around the home? Yes  If so, are there any without handrails? No   Home free of loose throw rugs in walkways, pet beds, electrical cords, etc? Yes  Adequate lighting in your home to reduce risk of falls? Yes   ASSISTIVE DEVICES UTILIZED TO PREVENT FALLS:  Life alert? No  Use of a cane, walker or w/c? No  Grab bars in the bathroom? Yes  Shower chair or bench in shower? No  Elevated toilet seat or a handicapped toilet? Yes    TIMED UP AND GO:  Was the test performed? No .     Cognitive Function: Declined today.         Screening Tests Health Maintenance  Topic Date Due  . PNA vac Low Risk Adult (2 of 2 - PPSV23) 08/11/2018  . MAMMOGRAM  11/01/2019  . TETANUS/TDAP  10/27/2020  . COLONOSCOPY  01/01/2023  .  INFLUENZA VACCINE  Completed  . DEXA SCAN  Completed  . Hepatitis C Screening   Completed    Qualifies for Shingles Vaccine? Completed series  Tdap: Up to date  Flu Vaccine: Up to date  Pneumococcal Vaccine: Due for Pneumococcal vaccine. Does the patient want to receive this vaccine today?  No .    Cancer Screenings:  Colorectal Screening: Completed 12/31/17. Repeat every 5 years.  Mammogram: Completed 11/01/18.  Bone Density: Completed 10/15/17. Results reflect NORMAL. No repeat needed unless advised by a physician.   Lung Cancer Screening: (Low Dose CT Chest recommended if Age 89-80 years, 30 pack-year currently smoking OR have quit w/in 15years.) does not qualify.   Additional Screening:  Hepatitis C Screening: Up to date  Vision Screening: Recommended annual ophthalmology exams for early detection of glaucoma and other disorders of the eye.  Dental Screening: Recommended annual dental exams for proper oral hygiene  Community Resource Referral:  CRR required this visit?  No       Plan:  I have personally reviewed and addressed the Medicare Annual Wellness questionnaire and have noted the following in the patient's chart:  A. Medical and social history B. Use of alcohol, tobacco or illicit drugs  C. Current medications and supplements D. Functional ability and status E.  Nutritional status F.  Physical activity G. Advance directives H. List of other physicians I.  Hospitalizations, surgeries, and ER visits in previous 12 months J.  [place] such as hearing and vision if needed, cognitive and depression L. Referrals and appointments   In addition, I have reviewed and discussed with patient certain preventive protocols, quality metrics, and best practice recommendations. A written personalized care plan for preventive services as well as general preventive health recommendations were provided to patient.   [place]ora Score, [place]   [id number]  Nurse Health Advisor   Nurse Notes: Pneumovax 23 due at next in office  visit.

## 2018-12-07 ENCOUNTER — Other Ambulatory Visit: Payer: Self-pay

## 2018-12-07 ENCOUNTER — Other Ambulatory Visit: Payer: Self-pay | Admitting: Cardiovascular Disease

## 2018-12-07 ENCOUNTER — Ambulatory Visit (INDEPENDENT_AMBULATORY_CARE_PROVIDER_SITE_OTHER): Payer: Medicare Other

## 2018-12-07 DIAGNOSIS — Z0000 Encounter for general adult medical examination without abnormal findings: Secondary | ICD-10-CM | POA: Diagnosis not present

## 2018-12-07 NOTE — Patient Instructions (Signed)
Ms. Olivia Dougherty , Thank you for taking time to come for your Medicare Wellness Visit. I appreciate your ongoing commitment to your health goals. Please review the following plan we discussed and let me know if I can assist you in the future.   Screening recommendations/referrals: Colonoscopy: Up to date, due 12/2022 Mammogram: Up to date, due 10/2020 Bone Density: Up to date, previous DEXA was normal. No repeat needed unless advised by a physician.  Recommended yearly ophthalmology/optometry visit for glaucoma screening and checkup Recommended yearly dental visit for hygiene and checkup  Vaccinations: Influenza vaccine: Up to date Pneumococcal vaccine: Pneumovax 23 due at next in office apt. Tdap vaccine: Up to date, due 10/2020 Shingles vaccine: Completed series    Advanced directives: Please bring a copy of your POA (Power of Attorney) and/or Living Will to your next appointment.   Conditions/risks identified: Recommend to continue cutting back on soda in take to 1 or less a day.   Next appointment: 12/10/18 @ 10:40 AM with Dr Brita Romp.   Preventive Care 66 Years and Older, Female Preventive care refers to lifestyle choices and visits with your health care provider that can promote health and wellness. What does preventive care include?  A yearly physical exam. This is also called an annual well check.  Dental exams once or twice a year.  Routine eye exams. Ask your health care provider how often you should have your eyes checked.  Personal lifestyle choices, including:  Daily care of your teeth and gums.  Regular physical activity.  Eating a healthy diet.  Avoiding tobacco and drug use.  Limiting alcohol use.  Practicing safe sex.  Taking low-dose aspirin every day.  Taking vitamin and mineral supplements as recommended by your health care provider. What happens during an annual well check? The services and screenings done by your health care provider during your  annual well check will depend on your age, overall health, lifestyle risk factors, and family history of disease. Counseling  Your health care provider may ask you questions about your:  Alcohol use.  Tobacco use.  Drug use.  Emotional well-being.  Home and relationship well-being.  Sexual activity.  Eating habits.  History of falls.  Memory and ability to understand (cognition).  Work and work Statistician.  Reproductive health. Screening  You may have the following tests or measurements:  Height, weight, and BMI.  Blood pressure.  Lipid and cholesterol levels. These may be checked every 5 years, or more frequently if you are over 24 years old.  Skin check.  Lung cancer screening. You may have this screening every year starting at age 66 if you have a 30-pack-year history of smoking and currently smoke or have quit within the past 15 years.  Fecal occult blood test (FOBT) of the stool. You may have this test every year starting at age 66.  Flexible sigmoidoscopy or colonoscopy. You may have a sigmoidoscopy every 5 years or a colonoscopy every 10 years starting at age 66.  Hepatitis C blood test.  Hepatitis B blood test.  Sexually transmitted disease (STD) testing.  Diabetes screening. This is done by checking your blood sugar (glucose) after you have not eaten for a while (fasting). You may have this done every 1-3 years.  Bone density scan. This is done to screen for osteoporosis. You may have this done starting at age 66.  Mammogram. This may be done every 1-2 years. Talk to your health care provider about how often you should have regular mammograms.  Talk with your health care provider about your test results, treatment options, and if necessary, the need for more tests. Vaccines  Your health care provider may recommend certain vaccines, such as:  Influenza vaccine. This is recommended every year.  Tetanus, diphtheria, and acellular pertussis (Tdap, Td)  vaccine. You may need a Td booster every 10 years.  Zoster vaccine. You may need this after age 66.  Pneumococcal 13-valent conjugate (PCV13) vaccine. One dose is recommended after age 12.  Pneumococcal polysaccharide (PPSV23) vaccine. One dose is recommended after age 66. Talk to your health care provider about which screenings and vaccines you need and how often you need them. This information is not intended to replace advice given to you by your health care provider. Make sure you discuss any questions you have with your health care provider. Document Released: 02/09/2015 Document Revised: 10/03/2015 Document Reviewed: 11/14/2014 Elsevier Interactive Patient Education  2017 [employer] Prevention in the Home Falls can cause injuries. They can happen to people of all ages. There are many things you can do to make your home safe and to help prevent falls. What can I do on the outside of my home?  Regularly fix the edges of walkways and driveways and fix any cracks.  Remove anything that might make you trip as you walk through a door, such as a raised step or threshold.  Trim any bushes or trees on the path to your home.  Use bright outdoor lighting.  Clear any walking paths of anything that might make someone trip, such as rocks or tools.  Regularly check to see if handrails are loose or broken. Make sure that both sides of any steps have handrails.  Any raised decks and porches should have guardrails on the edges.  Have any leaves, snow, or ice cleared regularly.  Use sand or salt on walking paths during winter.  Clean up any spills in your garage right away. This includes oil or grease spills. What can I do in the bathroom?  Use night lights.  Install grab bars by the toilet and in the tub and shower. Do not use towel bars as grab bars.  Use non-skid mats or decals in the tub or shower.  If you need to sit down in the shower, use a plastic, non-slip stool.   Keep the floor dry. Clean up any water that spills on the floor as soon as it happens.  Remove soap buildup in the tub or shower regularly.  Attach bath mats securely with double-sided non-slip rug tape.  Do not have throw rugs and other things on the floor that can make you trip. What can I do in the bedroom?  Use night lights.  Make sure that you have a light by your bed that is easy to reach.  Do not use any sheets or blankets that are too big for your bed. They should not hang down onto the floor.  Have a firm chair that has side arms. You can use this for support while you get dressed.  Do not have throw rugs and other things on the floor that can make you trip. What can I do in the kitchen?  Clean up any spills right away.  Avoid walking on wet floors.  Keep items that you use a lot in easy-to-reach places.  If you need to reach something above you, use a strong step stool that has a grab bar.  Keep electrical cords out of the way.  Do not use floor polish or wax that makes floors slippery. If you must use wax, use non-skid floor wax.  Do not have throw rugs and other things on the floor that can make you trip. What can I do with my stairs?  Do not leave any items on the stairs.  Make sure that there are handrails on both sides of the stairs and use them. Fix handrails that are broken or loose. Make sure that handrails are as long as the stairways.  Check any carpeting to make sure that it is firmly attached to the stairs. Fix any carpet that is loose or worn.  Avoid having throw rugs at the top or bottom of the stairs. If you do have throw rugs, attach them to the floor with carpet tape.  Make sure that you have a light switch at the top of the stairs and the bottom of the stairs. If you do not have them, ask someone to add them for you. What else can I do to help prevent falls?  Wear shoes that:  Do not have high heels.  Have rubber bottoms.  Are  comfortable and fit you well.  Are closed at the toe. Do not wear sandals.  If you use a stepladder:  Make sure that it is fully opened. Do not climb a closed stepladder.  Make sure that both sides of the stepladder are locked into place.  Ask someone to hold it for you, if possible.  Clearly mark and make sure that you can see:  Any grab bars or handrails.  First and last steps.  Where the edge of each step is.  Use tools that help you move around (mobility aids) if they are needed. These include:  Canes.  Walkers.  Scooters.  Crutches.  Turn on the lights when you go into a dark area. Replace any light bulbs as soon as they burn out.  Set up your furniture so you have a clear path. Avoid moving your furniture around.  If any of your floors are uneven, fix them.  If there are any pets around you, be aware of where they are.  Review your medicines with your doctor. Some medicines can make you feel dizzy. This can increase your chance of falling. Ask your doctor what other things that you can do to help prevent falls. This information is not intended to replace advice given to you by your health care provider. Make sure you discuss any questions you have with your health care provider. Document Released: 11/09/2008 Document Revised: 06/21/2015 Document Reviewed: 02/17/2014 Elsevier Interactive Patient Education  2017 [employer].

## 2018-12-10 ENCOUNTER — Ambulatory Visit: Payer: Medicare Other

## 2018-12-10 ENCOUNTER — Other Ambulatory Visit: Payer: Self-pay

## 2018-12-10 ENCOUNTER — Ambulatory Visit (INDEPENDENT_AMBULATORY_CARE_PROVIDER_SITE_OTHER): Payer: Medicare Other | Admitting: Family Medicine

## 2018-12-10 ENCOUNTER — Encounter: Payer: Self-pay | Admitting: Family Medicine

## 2018-12-10 VITALS — BP 133/76 | HR 58 | Temp 97.3°F | Resp 16 | Ht 62.0 in | Wt 224.8 lb

## 2018-12-10 DIAGNOSIS — E559 Vitamin D deficiency, unspecified: Secondary | ICD-10-CM | POA: Diagnosis not present

## 2018-12-10 DIAGNOSIS — K219 Gastro-esophageal reflux disease without esophagitis: Secondary | ICD-10-CM

## 2018-12-10 DIAGNOSIS — M2550 Pain in unspecified joint: Secondary | ICD-10-CM | POA: Diagnosis not present

## 2018-12-10 DIAGNOSIS — I10 Essential (primary) hypertension: Secondary | ICD-10-CM

## 2018-12-10 DIAGNOSIS — E039 Hypothyroidism, unspecified: Secondary | ICD-10-CM | POA: Diagnosis not present

## 2018-12-10 DIAGNOSIS — Z23 Encounter for immunization: Secondary | ICD-10-CM | POA: Diagnosis not present

## 2018-12-10 DIAGNOSIS — I2510 Atherosclerotic heart disease of native coronary artery without angina pectoris: Secondary | ICD-10-CM | POA: Diagnosis not present

## 2018-12-10 DIAGNOSIS — E7800 Pure hypercholesterolemia, unspecified: Secondary | ICD-10-CM

## 2018-12-10 MED ORDER — TRAZODONE HCL 50 MG PO TABS: 25.0000 mg | ORAL_TABLET | Freq: Every evening | ORAL | 3 refills | Status: DC | PRN

## 2018-12-10 MED ORDER — MELOXICAM 7.5 MG PO TABS: 7.5000 mg | ORAL_TABLET | Freq: Two times a day (BID) | ORAL | 3 refills | Status: DC

## 2018-12-10 NOTE — Assessment & Plan Note (Signed)
Well controlled Continue current medications Recheck metabolic panel F/u in 6 months  

## 2018-12-10 NOTE — Assessment & Plan Note (Signed)
Previously well controlled Recheck TSH Continue Synthroid at current dose pending test results

## 2018-12-10 NOTE — Assessment & Plan Note (Signed)
S/p angioplasty with stent placement in 2005 Followed regularly by cardiology Continue dual antiplatelet therapy and statin

## 2018-12-10 NOTE — Assessment & Plan Note (Signed)
Continue Crestor Followed by Cardiology as well Recheck CMP and FLP

## 2018-12-10 NOTE — Assessment & Plan Note (Signed)
Discussed importance of healthy weight management Discussed diet and exercise  

## 2018-12-10 NOTE — Assessment & Plan Note (Signed)
Well controlled Continue H2 blocker

## 2018-12-10 NOTE — Patient Instructions (Signed)
Preventive Care 66 Years and Older, Female Preventive care refers to lifestyle choices and visits with your health care provider that can promote health and wellness. This includes:  A yearly physical exam. This is also called an annual well check.  Regular dental and eye exams.  Immunizations.  Screening for certain conditions.  Healthy lifestyle choices, such as diet and exercise. What can I expect for my preventive care visit? Physical exam Your health care provider will check:  Height and weight. These may be used to calculate body mass index (BMI), which is a measurement that tells if you are at a healthy weight.  Heart rate and blood pressure.  Your skin for abnormal spots. Counseling Your health care provider may ask you questions about:  Alcohol, tobacco, and drug use.  Emotional well-being.  Home and relationship well-being.  Sexual activity.  Eating habits.  History of falls.  Memory and ability to understand (cognition).  Work and work Statistician.  Pregnancy and menstrual history. What immunizations do I need?  Influenza (flu) vaccine  This is recommended every year. Tetanus, diphtheria, and pertussis (Tdap) vaccine  You may need a Td booster every 10 years. Varicella (chickenpox) vaccine  You may need this vaccine if you have not already been vaccinated. Zoster (shingles) vaccine  You may need this after age 66. Pneumococcal conjugate (PCV13) vaccine  One dose is recommended after age 66. Pneumococcal polysaccharide (PPSV23) vaccine  One dose is recommended after age 72. Measles, mumps, and rubella (MMR) vaccine  You may need at least one dose of MMR if you were born in 1957 or later. You may also need a second dose. Meningococcal conjugate (MenACWY) vaccine  You may need this if you have certain conditions. Hepatitis A vaccine  You may need this if you have certain conditions or if you travel or work in places where you may be exposed  to hepatitis A. Hepatitis B vaccine  You may need this if you have certain conditions or if you travel or work in places where you may be exposed to hepatitis B. Haemophilus influenzae type b (Hib) vaccine  You may need this if you have certain conditions. You may receive vaccines as individual doses or as more than one vaccine together in one shot (combination vaccines). Talk with your health care provider about the risks and benefits of combination vaccines. What tests do I need? Blood tests  Lipid and cholesterol levels. These may be checked every 5 years, or more frequently depending on your overall health.  Hepatitis C test.  Hepatitis B test. Screening  Lung cancer screening. You may have this screening every year starting at age 66 if you have a 30-pack-year history of smoking and currently smoke or have quit within the past 15 years.  Colorectal cancer screening. All adults should have this screening starting at age 66 and continuing until age 15. Your health care provider may recommend screening at age 66 if you are at increased risk. You will have tests every 1-10 years, depending on your results and the type of screening test.  Diabetes screening. This is done by checking your blood sugar (glucose) after you have not eaten for a while (fasting). You may have this done every 1-3 years.  Mammogram. This may be done every 1-2 years. Talk with your health care provider about how often you should have regular mammograms.  BRCA-related cancer screening. This may be done if you have a family history of breast, ovarian, tubal, or peritoneal cancers.  Other tests  Sexually transmitted disease (STD) testing.  Bone density scan. This is done to screen for osteoporosis. You may have this done starting at age 66. Follow these instructions at home: Eating and drinking  Eat a diet that includes fresh fruits and vegetables, whole grains, lean protein, and low-fat dairy products. Limit  your intake of foods with high amounts of sugar, saturated fats, and salt.  Take vitamin and mineral supplements as recommended by your health care provider.  Do not drink alcohol if your health care provider tells you not to drink.  If you drink alcohol: ? Limit how much you have to 0-1 drink a day. ? Be aware of how much alcohol is in your drink. In the [place] one drink equals one 12 oz bottle of beer (355 mL), one 5 oz glass of wine (148 mL), or one 1 oz glass of hard liquor (44 mL). Lifestyle  Take daily care of your teeth and gums.  Stay active. Exercise for at least 30 minutes on 5 or more days each week.  Do not use any products that contain nicotine or tobacco, such as cigarettes, e-cigarettes, and chewing tobacco. If you need help quitting, ask your health care provider.  If you are sexually active, practice safe sex. Use a condom or other form of protection in order to prevent STIs (sexually transmitted infections).  Talk with your health care provider about taking a low-dose aspirin or statin. What's next?  Go to your health care provider once a year for a well check visit.  Ask your health care provider how often you should have your eyes and teeth checked.  Stay up to date on all vaccines. This information is not intended to replace advice given to you by your health care provider. Make sure you discuss any questions you have with your health care provider. Document Released: 02/09/2015 Document Revised: 01/07/2018 Document Reviewed: 01/07/2018 Elsevier Patient Education  2020 [employer].

## 2018-12-10 NOTE — Assessment & Plan Note (Signed)
Suspect OA, but will refer to Rheum per patient's request for further eval

## 2018-12-10 NOTE — Assessment & Plan Note (Signed)
Recheck Vit D level 

## 2018-12-10 NOTE — Progress Notes (Signed)
Patient: Olivia Dougherty, Female    DOB: [birth year], 68 y.o.   MRN: [id number] Visit Date: 12/10/2018  Today's Provider: [name], MD   Chief Complaint  Patient presents with  . Hypertension  . Hyperlipidemia  . Hypothyroidism   Subjective:    Olivia Dougherty is a 68 y.o. female. She feels well. She reports exercising no. She reports she is sleeping fairly well.  12/07/2018 AWV with [facility] 09/21/2017 CPE 02/04/2016 Pap/HPV-negative GYN 11/01/2018 Mammogram BI-RADS 1 10/15/2017 BMD-Normal 12/31/2017 Colonoscopy-Polyps, diverticulosis, internal hemorrhoids. 12/31/2017 Path report-Hyperplastic polyp with lymphoid aggregate. -----------------------------------------------------------  Hypertension, follow-up:  BP Readings from Last 3 Encounters:  12/10/18 133/76  11/11/18 (!) 150/72  11/01/18 130/62    She was last seen for hypertension 1 years ago.  BP at that visit was 126/82. Management changes since that visit include no changes. She reports excellent compliance with treatment. She is not having side effects.  She is not exercising. She is adherent to low salt diet.   Outside blood pressures are stable. She is experiencing none.  Patient denies chest pain, chest pressure/discomfort, exertional chest pressure/discomfort and lower extremity edema.   Cardiovascular risk factors include advanced age (older than 25 for men, 90 for women), dyslipidemia, hypertension and obesity (BMI >= 30 kg/m2).  Use of agents associated with hypertension: none.     Weight trend: stable Wt Readings from Last 3 Encounters:  12/10/18 224 lb 12.8 oz (102 kg)  11/11/18 225 lb (102.1 kg)  11/01/18 224 lb (101.6 kg)   Current diet: in general, a "healthy" diet    ------------------------------------------------------------------------   Lipid/Cholesterol, Follow-up:   Last seen for this1 years ago.  Management changes since that visit include no  changes. . Last Lipid Panel:    Component Value Date/Time   CHOL 158 05/21/2017 0957   TRIG 110 05/21/2017 0957   HDL 57 05/21/2017 0957   CHOLHDL 2.8 05/21/2017 0957   VLDL 22 05/21/2017 0957   LDLCALC 79 05/21/2017 0957    Risk factors for vascular disease include hypercholesterolemia and hypertension  She reports excellent compliance with treatment. She is not having side effects.  Current symptoms include none and have been stable. Weight trend: stable Prior visit with dietician: no Current diet: well balanced Current exercise: walking  Wt Readings from Last 3 Encounters:  12/10/18 224 lb 12.8 oz (102 kg)  11/11/18 225 lb (102.1 kg)  11/01/18 224 lb (101.6 kg)    -------------------------------------------------------------------  Hypothyroid, follow-up:  TSH  Date Value Ref Range Status  12/01/2017 0.951 0.450 - 4.500 uIU/mL Final   Wt Readings from Last 3 Encounters:  12/10/18 224 lb 12.8 oz (102 kg)  11/11/18 225 lb (102.1 kg)  11/01/18 224 lb (101.6 kg)    She was last seen for hypothyroid 1 years ago.  Management since that visit includes check labs. She reports excellent compliance with treatment. She is not having side effects.  She is exercising. She is experiencing none She denies change in energy level, heat / cold intolerance and palpitations Weight trend: stable  ------------------------------------------------------------------------  Having difficulty falling asleep. Hard to turn brain off. Worried about pandemic. Tylenol PM helps  Would like to see a rheumatologist due to polyarthralgia.  Has AM stiffness. Not much swelling.  Seems to migrate to different joints. No fever.   Review of Systems  Constitutional: Negative.   HENT: Negative.   Eyes: Negative.   Respiratory: Negative.   Cardiovascular: Negative.   Gastrointestinal:  Negative.   Endocrine: Negative.   Genitourinary: Negative.   Musculoskeletal: Positive for arthralgias,  back pain, joint swelling and myalgias.  Skin: Negative.   Allergic/Immunologic: Negative.   Neurological: Negative.   Hematological: Negative.   Psychiatric/Behavioral: Positive for sleep disturbance. The patient is nervous/anxious.     Social History   Socioeconomic History  . Marital status: Married    Spouse name: Olivia Dougherty  . Number of children: 3  . Years of education: 50  . Highest education level: High school graduate  Occupational History  . Not on file  Social Needs  . Financial resource strain: Not hard at all  . Food insecurity    Worry: Never true    Inability: Never true  . Transportation needs    Medical: No    Non-medical: No  Tobacco Use  . Smoking status: Never Smoker  . Smokeless tobacco: Never Used  Substance and Sexual Activity  . Alcohol use: Yes    Alcohol/week: 2.0 standard drinks    Types: 2 Glasses of wine per week  . Drug use: No  . Sexual activity: Yes    Partners: Male    Birth control/protection: Surgical  Lifestyle  . Physical activity    Days per week: 0 days    Minutes per session: 0 min  . Stress: Not at all  Relationships  . Social Herbalist on phone: Patient refused    Gets together: Patient refused    Attends religious service: Patient refused    Active member of club or organization: Patient refused    Attends meetings of clubs or organizations: Patient refused    Relationship status: Patient refused  . Intimate partner violence    Fear of current or ex partner: Patient refused    Emotionally abused: Patient refused    Physically abused: Patient refused    Forced sexual activity: Patient refused  Other Topics Concern  . Not on file  Social History Narrative   2 living children; one son deceased from overdose.    Past Medical History:  Diagnosis Date  . Anxiety   . Arthritis    lower back  . Basal cell carcinoma 11/2011   face, left leg-using chemo cream on temple area  . Coronary artery disease 2005    LAD PCI with Cypher drug-eluting stent placement 3.0 x 13 mm with balloon angioplasty of diagonal.  Cardiac catheterization in 2010 showed patent stent with minimal restenosis.  Marland Kitchen GERD (gastroesophageal reflux disease)   . Heart disease   . Heart problem    98% blockage  . Hypercholesteremia   . Hypertension   . Hypothyroidism   . Kidney stone   . Osteoarthritis   . PONV (postoperative nausea and vomiting)    after 1 procedure approx 10 yrs ago  . Sleep apnea    mild, could not tol cpap  . Squamous cell carcinoma of skin    leg     Patient Active Problem List   Diagnosis Date Noted  . Personal history of colonic polyps   . Acute right-sided low back pain without sciatica 12/02/2017  . Morbid obesity ([place]) 09/21/2017  . Essential hypertension 08/10/2017  . Hyperlipidemia 08/10/2017  . OSA (obstructive sleep apnea) 08/10/2017  . Allergic rhinitis 08/10/2017  . GAD (generalized anxiety disorder) 08/10/2017  . GERD (gastroesophageal reflux disease) 08/10/2017  . Special screening for malignant neoplasms, colon   . Benign neoplasm of ascending colon   . Benign neoplasm of descending colon   .  Current tear knee, medial meniscus 05/25/2014  . Arthritis of knee, degenerative 05/25/2014  . Squamous cell carcinoma 01/13/2014  . History of basal cell carcinoma 01/13/2014  . Osteoarthritis 07/29/2011  . Coronary atherosclerosis of native coronary artery 01/01/2006  . Acquired hypothyroidism 01/01/2006    Past Surgical History:  Procedure Laterality Date  . ANGIOPLASTY  2005   CAD   98% blockage  . CARDIAC CATHETERIZATION  02/2008  . COLONOSCOPY WITH PROPOFOL N/A 12/29/2014   Procedure: COLONOSCOPY WITH PROPOFOL;  Surgeon: Lucilla Lame, MD;  Location: [place];  Service: Endoscopy;  Laterality: N/A;  . COLONOSCOPY WITH PROPOFOL N/A 12/31/2017   Procedure: COLONOSCOPY WITH PROPOFOL;  Surgeon: Lucilla Lame, MD;  Location: [place];  Service: Endoscopy;  Laterality:  N/A;  sleep apnea  . CORONARY ANGIOPLASTY  2005   LAD  . HYSTEROSCOPY  11/2001   resection of benign leiomyoma  . KNEE ARTHROSCOPY Right 08/16/2014   Procedure: ARTHROSCOPY KNEE, partial lateral menisectomy and chondroplasty;  Surgeon: Dereck Leep, MD;  Location: [facility];  Service: Orthopedics;  Laterality: Right;  . POLYPECTOMY  12/29/2014   Procedure: POLYPECTOMY;  Surgeon: Lucilla Lame, MD;  Location: [place];  Service: Endoscopy;;  . SKIN CANCER EXCISION Right 02/2018   Right leg - Squamous   . THYROID CYST EXCISION Left 10/2002   left lobe thyroid excision  . THYROIDECTOMY, PARTIAL Left   . TUBAL LIGATION  1980   BTL  . UMBILICAL HERNIA REPAIR  1980  . vulvar cancer  1993   laser    Her family history includes Cervical cancer in her sister; Diabetes in her father; Heart disease in her father; Hypertension in her father and mother; Stroke in her sister. There is no history of Colon cancer, Breast cancer, or Ovarian cancer.   Current Outpatient Medications:  .  aspirin 81 MG tablet, Take 81 mg by mouth daily., Disp: , Rfl:  .  clopidogrel (PLAVIX) 75 MG tablet, TAKE ONE TABLET BY MOUTH DAILY WITH BREAKFAST, Disp: 90 tablet, Rfl: 3 .  cyclobenzaprine (FLEXERIL) 5 MG tablet, TAKE ONE TABLET BY MOUTH THREE TIMES A DAY AS NEEDED FOR MUSCLE SPASMS, Disp: 30 tablet, Rfl: 0 .  fluticasone (FLONASE) 50 MCG/ACT nasal spray, Place 2 sprays into both nostrils daily. As needed / seasonal, Disp: , Rfl:  .  levothyroxine (SYNTHROID) 75 MCG tablet, TAKE ONE TABLET BY MOUTH DAILY BEFORE BREAKFAST, Disp: 30 tablet, Rfl: 2 .  loratadine (CLARITIN) 10 MG tablet, Take 10 mg by mouth daily as needed. , Disp: , Rfl:  .  losartan (COZAAR) 50 MG tablet, TAKE TWO TABLETS BY MOUTH DAILY, Disp: 180 tablet, Rfl: 3 .  meloxicam (MOBIC) 7.5 MG tablet, Take 7.5 mg by mouth 2 (two) times daily. As needed, Disp: , Rfl:  .  metoprolol tartrate (LOPRESSOR) 25 MG tablet, TAKE ONE TABLET BY MOUTH TWICE A  DAY, Disp: 180 tablet, Rfl: 2 .  Multiple Vitamins-Minerals (MULTIVITAMIN PO), Take by mouth daily., Disp: , Rfl:  .  rosuvastatin (CRESTOR) 10 MG tablet, TAKE ONE TABLET BY MOUTH DAILY, Disp: 90 tablet, Rfl: 0 .  Vitamin D, Ergocalciferol, (DRISDOL) 1.25 MG (50000 UT) CAPS capsule, TAKE ONE CAPSULE EVERY 14 DAYS, Disp: 6 capsule, Rfl: 1  Patient Care Team: Virginia Crews, MD as PCP - General (Family Medicine) Lucilla Lame, MD as Consulting Physician (Gastroenterology) Hooten, Laurice Record, MD (Orthopedic Surgery) Oneta Rack, MD (Dermatology)     Objective:    Vitals: BP  133/76 (BP Location: Left Arm, Patient Position: Sitting, Cuff Size: Large)   Pulse (!) 58   Temp (!) 97.3 F (36.3 C) (Temporal)   Resp 16   Ht 5\' 2"  (1.575 m)   Wt 224 lb 12.8 oz (102 kg)   LMP 01/28/2007 (Approximate)   BMI 41.12 kg/m   Physical Exam Vitals signs reviewed.  Constitutional:      General: She is not in acute distress.    Appearance: Normal appearance. She is well-developed. She is not diaphoretic.  HENT:     Head: Normocephalic and atraumatic.     Right Ear: Tympanic membrane, ear canal and external ear normal.     Left Ear: Tympanic membrane, ear canal and external ear normal.  Eyes:     General: No scleral icterus.    Conjunctiva/sclera: Conjunctivae normal.     Pupils: Pupils are equal, round, and reactive to light.  Neck:     Musculoskeletal: Neck supple.     Thyroid: No thyromegaly.  Cardiovascular:     Rate and Rhythm: Normal rate and regular rhythm.     Pulses: Normal pulses.     Heart sounds: Normal heart sounds. No murmur.  Pulmonary:     Effort: Pulmonary effort is normal. No respiratory distress.     Breath sounds: Normal breath sounds. No wheezing or rales.  Abdominal:     General: There is no distension.     Palpations: Abdomen is soft.     Tenderness: There is no abdominal tenderness.  Musculoskeletal:        General: No deformity.     Right lower leg: No  edema.     Left lower leg: No edema.  Lymphadenopathy:     Cervical: No cervical adenopathy.  Skin:    General: Skin is warm and dry.     Capillary Refill: Capillary refill takes less than 2 seconds.     Findings: No rash.  Neurological:     Mental Status: She is alert and oriented to person, place, and time. Mental status is at baseline.  Psychiatric:        Mood and Affect: Mood normal.        Behavior: Behavior normal.        Thought Content: Thought content normal.     Activities of Daily Living In your present state of health, do you have any difficulty performing the following activities: 12/07/2018 12/31/2017  Hearing? N N  Vision? N N  Difficulty concentrating or making decisions? N N  Walking or climbing stairs? Y N  Comment Occasionally due to back or knee pain. -  Dressing or bathing? N N  Doing errands, shopping? N -  Preparing Food and eating ? N -  Using the Toilet? N -  In the past six months, have you accidently leaked urine? N -  Do you have problems with loss of bowel control? N -  Managing your Medications? N -  Managing your Finances? N -  Housekeeping or managing your Housekeeping? N -  Some recent data might be hidden    Fall Risk Assessment Fall Risk  12/07/2018 08/10/2017  Falls in the past year? 0 No  Number falls in past yr: 0 -  Injury with Fall? 0 -     Depression Screen PHQ 2/9 Scores 12/07/2018 08/10/2017  PHQ - 2 Score 0 0   Cognitive Testing - 6-CIT  Correct? Score   What year is it? yes 0 0 or 4  What  month is it? yes 0 0 or 3  Memorize:    Pia Mau,  44,  [place],      What time is it? (within 1 hour) yes 0 0 or 3  Count backwards from 20 yes 0 0, 2, or 4  Name the months of the year yes 0 0, 2, or 4  Repeat name & address above yes 0 0, 2, 4, 6, 8, or 10       TOTAL SCORE  0/28   Interpretation:  Normal  Normal (0-7) Abnormal (8-28)     Office Visit from 09/21/2017 in [facility]  AUDIT-C Score   0      No flowsheet data found.     Assessment & Plan:    Annual Physical Reviewed patient's Family Medical History Reviewed and updated list of patient's medical providers Assessment of cognitive impairment was done Assessed patient's functional ability Established a written schedule for health screening [place] Completed and Reviewed  Exercise Activities and Dietary recommendations Goals    . DIET - DECREASE SODA OR JUICE INTAKE     Recommend to continue cutting back on soda in take to 1 or less a day.        Immunization History  Administered Date(s) Administered  . Fluad Quad(high Dose 65+) 10/06/2018  . Influenza, High Dose Seasonal PF 10/21/2017  . Influenza-Unspecified 12/27/2014, 10/28/2015  . Pneumococcal Conjugate-13 08/10/2017  . Tdap 10/28/2010  . Zoster Recombinat (Shingrix) 08/19/2017, 12/09/2017    Health Maintenance  Topic Date Due  . PNA vac Low Risk Adult (2 of 2 - PPSV23) 08/11/2018  . MAMMOGRAM  11/01/2019  . TETANUS/TDAP  10/27/2020  . COLONOSCOPY  01/01/2023  . INFLUENZA VACCINE  Completed  . DEXA SCAN  Completed  . Hepatitis C Screening  Completed     Discussed health benefits of physical activity, and encouraged her to engage in regular exercise appropriate for her age and condition.    ------------------------------------------------------------------------------------------------------------  Problem List Items Addressed This Visit      Cardiovascular and Mediastinum   Coronary atherosclerosis of native coronary artery    S/p angioplasty with stent placement in 2005 Followed regularly by cardiology Continue dual antiplatelet therapy and statin      Relevant Orders   CBC with Differential/Platelet   Essential hypertension - Primary    Well controlled Continue current medications Recheck metabolic panel F/u in 6 months       Relevant Orders   CBC with Differential/Platelet   Comprehensive metabolic  panel     Digestive   GERD (gastroesophageal reflux disease)    Well controlled Continue H2 blocker      Relevant Orders   CBC with Differential/Platelet     Endocrine   Acquired hypothyroidism    Previously well controlled Recheck TSH Continue Synthroid at current dose pending test results      Relevant Orders   TSH     Other   Hyperlipidemia    Continue Crestor Followed by Cardiology as well Recheck CMP and FLP      Relevant Orders   Comprehensive metabolic panel   Lipid Panel With LDL/HDL Ratio   Morbid obesity ([place])    Discussed importance of healthy weight management Discussed diet and exercise       Relevant Orders   CBC with Differential/Platelet   Polyarthralgia    Suspect OA, but will refer to Rheum per patient's request for further eval  Relevant Orders   Ambulatory referral to Rheumatology   Avitaminosis D    Recheck Vit D level      Relevant Orders   Vit D  25 hydroxy    Other Visit Diagnoses    Need for 23-polyvalent pneumococcal polysaccharide vaccine       Relevant Orders   Pneumococcal polysaccharide vaccine 23-valent greater than or equal to 2yo subcutaneous/IM (Completed)       Return in about 6 months (around 06/09/2019) for chronic disease f/u.   The entirety of the information documented in the History of Present Illness, Review of Systems and Physical Exam were personally obtained by me. Portions of this information were initially documented by Lynford Humphrey, CMA and reviewed by me for thoroughness and accuracy.    Aamiyah Derrick, Dionne Bucy, MD MPH [facility]

## 2018-12-13 DIAGNOSIS — E039 Hypothyroidism, unspecified: Secondary | ICD-10-CM | POA: Diagnosis not present

## 2018-12-13 DIAGNOSIS — E559 Vitamin D deficiency, unspecified: Secondary | ICD-10-CM | POA: Diagnosis not present

## 2018-12-13 DIAGNOSIS — E7800 Pure hypercholesterolemia, unspecified: Secondary | ICD-10-CM | POA: Diagnosis not present

## 2018-12-13 DIAGNOSIS — I10 Essential (primary) hypertension: Secondary | ICD-10-CM | POA: Diagnosis not present

## 2018-12-14 ENCOUNTER — Telehealth: Payer: Self-pay

## 2018-12-14 LAB — COMPREHENSIVE METABOLIC PANEL
ALT: 25 IU/L (ref 0–32)
AST: 28 IU/L (ref 0–40)
Albumin/Globulin Ratio: 1.5 (ref 1.2–2.2)
Albumin: 3.9 g/dL (ref 3.8–4.8)
Alkaline Phosphatase: 90 IU/L (ref 39–117)
BUN/Creatinine Ratio: 19 (ref 12–28)
BUN: 14 mg/dL (ref 8–27)
Bilirubin Total: 0.7 mg/dL (ref 0.0–1.2)
CO2: 24 mmol/L (ref 20–29)
Calcium: 9.2 mg/dL (ref 8.7–10.3)
Chloride: 105 mmol/L (ref 96–106)
Creatinine, Ser: 0.74 mg/dL (ref 0.57–1.00)
GFR calc Af Amer: 98 mL/min/{1.73_m2} (ref >59)
GFR calc non Af Amer: 85 mL/min/{1.73_m2} (ref >59)
Globulin, Total: 2.6 g/dL (ref 1.5–4.5)
Glucose: 94 mg/dL (ref 65–99)
Potassium: 4 mmol/L (ref 3.5–5.2)
Sodium: 142 mmol/L (ref 134–144)
Total Protein: 6.5 g/dL (ref 6.0–8.5)

## 2018-12-14 LAB — CBC WITH DIFFERENTIAL/PLATELET
Basophils Absolute: 0 10*3/uL (ref 0.0–0.2)
Basos: 1 %
EOS (ABSOLUTE): 0.2 10*3/uL (ref 0.0–0.4)
Eos: 3 %
Hematocrit: 39.5 % (ref 34.0–46.6)
Hemoglobin: 13.7 g/dL (ref 11.1–15.9)
Immature Grans (Abs): 0 10*3/uL (ref 0.0–0.1)
Immature Granulocytes: 0 %
Lymphocytes Absolute: 1.7 10*3/uL (ref 0.7–3.1)
Lymphs: 32 %
MCH: 31.9 pg (ref 26.6–33.0)
MCHC: 34.7 g/dL (ref 31.5–35.7)
MCV: 92 fL (ref 79–97)
Monocytes Absolute: 0.4 10*3/uL (ref 0.1–0.9)
Monocytes: 8 %
Neutrophils Absolute: 2.9 10*3/uL (ref 1.4–7.0)
Neutrophils: 56 %
Platelets: 198 10*3/uL (ref 150–450)
RBC: 4.3 x10E6/uL (ref 3.77–5.28)
RDW: 11.9 % (ref 11.7–15.4)
WBC: 5.2 10*3/uL (ref 3.4–10.8)

## 2018-12-14 LAB — VITAMIN D 25 HYDROXY (VIT D DEFICIENCY, FRACTURES): Vit D, 25-Hydroxy: 32.9 ng/mL (ref 30.0–100.0)

## 2018-12-14 LAB — LIPID PANEL WITH LDL/HDL RATIO
Cholesterol, Total: 165 mg/dL (ref 100–199)
HDL: 63 mg/dL (ref >39)
LDL Chol Calc (NIH): 84 mg/dL (ref 0–99)
LDL/HDL Ratio: 1.3 ratio (ref 0.0–3.2)
Triglycerides: 97 mg/dL (ref 0–149)
VLDL Cholesterol Cal: 18 mg/dL (ref 5–40)

## 2018-12-14 LAB — TSH: TSH: 1.31 u[IU]/mL (ref 0.450–4.500)

## 2018-12-14 NOTE — Telephone Encounter (Signed)
Viewed by Erle Crocker [place] on 12/14/2018 11:07 AM Called patient to go over lab results. Patient reports that she will be calling cardiology to over her LDL results. Patient reports that her cardiologist had recommended patient to try and get her LDL below 70.

## 2018-12-14 NOTE — Telephone Encounter (Signed)
lmtcb

## 2018-12-14 NOTE — Telephone Encounter (Signed)
From [facility] 

## 2018-12-14 NOTE — Telephone Encounter (Signed)
Patient is returning call to go over lab results Call back [phone]

## 2018-12-14 NOTE — Telephone Encounter (Signed)
-----   Message from Virginia Crews, MD sent at 12/14/2018  8:07 AM EST ----- Normal labs

## 2018-12-17 DIAGNOSIS — M19041 Primary osteoarthritis, right hand: Secondary | ICD-10-CM | POA: Diagnosis not present

## 2018-12-17 DIAGNOSIS — M19042 Primary osteoarthritis, left hand: Secondary | ICD-10-CM | POA: Diagnosis not present

## 2018-12-17 DIAGNOSIS — M47816 Spondylosis without myelopathy or radiculopathy, lumbar region: Secondary | ICD-10-CM | POA: Diagnosis not present

## 2018-12-17 DIAGNOSIS — M1711 Unilateral primary osteoarthritis, right knee: Secondary | ICD-10-CM | POA: Diagnosis not present

## 2019-01-09 ENCOUNTER — Other Ambulatory Visit: Payer: Self-pay | Admitting: Obstetrics & Gynecology

## 2019-01-09 ENCOUNTER — Other Ambulatory Visit: Payer: Self-pay | Admitting: Family Medicine

## 2019-01-09 DIAGNOSIS — E559 Vitamin D deficiency, unspecified: Secondary | ICD-10-CM

## 2019-01-10 NOTE — Telephone Encounter (Signed)
Requested medication (s) are due for refill today: yes  Requested medication (s) are on the active medication list: yes  Last refill:  11/24/2018  Future visit scheduled: yes  Notes to clinic:  refill cannot be delegated    Requested Prescriptions  Pending Prescriptions Disp Refills   cyclobenzaprine (FLEXERIL) 5 MG tablet [Pharmacy Med Name: CYCLOBENZAPRINE 5 MG TABLET] 30 tablet 0    Sig: TAKE ONE TABLET BY MOUTH THREE TIMES A DAY AS NEEDED FOR MUSCLE SPASMS      Not Delegated - Analgesics:  Muscle Relaxants Failed - 01/09/2019 12:58 PM      Failed - This refill cannot be delegated      Failed - Valid encounter within last 6 months    Recent Outpatient Visits           1 month ago Essential hypertension   [facility] [place], Dionne Bucy, MD   1 year ago Essential hypertension   [place], Dionne Bucy, MD   1 year ago Welcome to [employer] preventive visit   [facility], Dionne Bucy, MD   1 year ago Atherosclerosis of native coronary artery of native heart without angina pectoris   [facility], Dionne Bucy, MD       Future Appointments             In 5 months Bacigalupo, Dionne Bucy, MD [facility]             Signed Prescriptions Disp Refills   levothyroxine (SYNTHROID) 75 MCG tablet 90 tablet 0    Sig: TAKE ONE TABLET BY MOUTH DAILY BEFORE BREAKFAST      Endocrinology:  Hypothyroid Agents Failed - 01/09/2019 12:58 PM      Failed - TSH needs to be rechecked within 3 months after an abnormal result. Refill until TSH is due.      Failed - Valid encounter within last 12 months    Recent Outpatient Visits           1 month ago Essential hypertension   [facility] [place], Dionne Bucy, MD   1 year ago Essential hypertension   [place], Dionne Bucy, MD   1 year ago [facility] to [facility] preventive visit   [facility], Dionne Bucy, MD   1 year ago Atherosclerosis of native coronary artery of native heart without angina pectoris   [place], MD       Future Appointments             In 5 months Bacigalupo, Dionne Bucy, MD [facility], [facility]            Passed - TSH in normal range and within 360 days    TSH  Date Value Ref Range Status  12/13/2018 1.310 0.450 - 4.500 uIU/mL Final

## 2019-01-10 NOTE — Telephone Encounter (Signed)
Med refill request: Vit D 50000 IU capsules  Pt had repeat Vit D labs in Nov 2020 from Dr Brita Romp and results were 32.9.   Does pt need to continue Vit D capsules? Please advise.   Orders pended if approved.

## 2019-01-14 NOTE — Telephone Encounter (Signed)
Patient notified of refill. Read back instructions for VIt D, verbalizes understanding.

## 2019-01-14 NOTE — Telephone Encounter (Signed)
Patient called to check on request for vitamin D.

## 2019-01-14 NOTE — Telephone Encounter (Signed)
Rx has been completed.  Thanks.  Ok to close encounter.

## 2019-01-17 ENCOUNTER — Other Ambulatory Visit: Payer: Self-pay | Admitting: Cardiovascular Disease

## 2019-02-25 ENCOUNTER — Telehealth: Payer: Self-pay | Admitting: Cardiovascular Disease

## 2019-02-25 DIAGNOSIS — H33001 Unspecified retinal detachment with retinal break, right eye: Secondary | ICD-10-CM | POA: Diagnosis not present

## 2019-02-25 DIAGNOSIS — H33021 Retinal detachment with multiple breaks, right eye: Secondary | ICD-10-CM | POA: Diagnosis not present

## 2019-02-25 DIAGNOSIS — H43392 Other vitreous opacities, left eye: Secondary | ICD-10-CM | POA: Diagnosis not present

## 2019-02-25 DIAGNOSIS — H43813 Vitreous degeneration, bilateral: Secondary | ICD-10-CM | POA: Diagnosis not present

## 2019-02-25 DIAGNOSIS — H35372 Puckering of macula, left eye: Secondary | ICD-10-CM | POA: Diagnosis not present

## 2019-02-25 NOTE — Telephone Encounter (Signed)
° °  [facility] Pre-operative Risk Assessment   PATIENT CALLING FOR PERMISSION TO HOLD FOR URGENT REPAIR Request for surgical clearance:  1. What type of surgery is being performed? Torn Retina Repair   2. When is this surgery scheduled?  02/28/19  3. What type of clearance is required (medical clearance vs. Pharmacy clearance to hold med vs. Both)? Pharmacy   4. Are there any medications that need to be held prior to surgery and how long? Plavix and asa   5. Practice name and name of physician performing surgery? [facility] Dr. Sherlynn Stalls   6. What is your office phone number [phone]   7.   What is your office fax number [phone]   8.   Anesthesia type (None, local, MAC, general) ? Unknown    Clarisse Gouge 02/25/2019, 3:08 PM  _________________________________________________________________   (provider comments below)

## 2019-02-25 NOTE — Telephone Encounter (Signed)
    Primary Cardiologist: Dr. Fletcher Anon Chart reviewed as part of pre-operative protocol coverage. Patient was contacted 02/25/2019 in reference to pre-operative risk assessment for pending surgery as outlined below.  Olivia Dougherty was last seen on 10/12/18 by Dr. Fletcher Anon.  Since that day, Olivia Dougherty has done well.   Therefore, based on ACC/AHA guidelines, the patient would be at acceptable risk for the planned procedure without further cardiovascular testing.    Per note by Dr. Fletcher Anon "She underwent LAD PCI and Cypher drug-eluting stent placement with balloon angioplasty of diagonal in 2005 by Dr. Rollene Fare.  No cardiac events since then.  Cardiac catheterization in 2010 showed patent stent with minimal restenosis. She has known history of hyperlipidemia with intolerance to most statins due to myalgia.  She had CTA of the coronary arteries in 2017 which showed minimal plaque with no obstructive disease and patent LAD stent. Given that she had first generation drug-eluting stent to the LAD, I favor continued lifelong dual antiplatelet therapy as tolerated."  Patient has torn Retina and scheduled for surgery on Monday 02/28/19. Patient already took Plavix this morning. She takes ASA in evening. I have spoke with Dr Fletcher Anon personally who recommends okay to hold Plavix (usally hold for 5 days prior to surgery) for procedure on Monday and resume as per direction of surgeon. She will be acceptable bleeding risk. She should continue ASA daily.   Will route this recommendations to your requesting provider.   [place], [place] 02/25/2019, 3:30 PM

## 2019-02-25 NOTE — Telephone Encounter (Signed)
Patient would like a call back today.

## 2019-02-28 DIAGNOSIS — H33021 Retinal detachment with multiple breaks, right eye: Secondary | ICD-10-CM | POA: Diagnosis not present

## 2019-03-01 DIAGNOSIS — H33021 Retinal detachment with multiple breaks, right eye: Secondary | ICD-10-CM | POA: Diagnosis not present

## 2019-03-25 ENCOUNTER — Telehealth: Payer: Self-pay

## 2019-03-25 NOTE — Telephone Encounter (Signed)
Copied from [place] [id number]. Topic: General - Other >> Mar 25, 2019  2:04 PM Celene Kras wrote: Reason for CRM: Pt called and is requesting to have a nurse call her back regarding wether it would be okay for her to have the vaccine due to her medical history/ medications.Please advise.

## 2019-03-28 NOTE — Telephone Encounter (Signed)
Patient advised as below.  

## 2019-03-28 NOTE — Telephone Encounter (Signed)
I would recommend Covid vaccination for her, especially because some of her medical problems put her at higher risk for complications from [id number] if she were to get the virus.

## 2019-03-30 DIAGNOSIS — H33021 Retinal detachment with multiple breaks, right eye: Secondary | ICD-10-CM | POA: Diagnosis not present

## 2019-03-31 ENCOUNTER — Ambulatory Visit: Payer: Medicare Other | Attending: Internal Medicine

## 2019-03-31 ENCOUNTER — Other Ambulatory Visit: Payer: Self-pay

## 2019-03-31 DIAGNOSIS — Z23 Encounter for immunization: Secondary | ICD-10-CM | POA: Insufficient documentation

## 2019-03-31 NOTE — Progress Notes (Signed)
   Covid-19 Vaccination Clinic  Name:  Olivia Dougherty    MRN: [id number] DOB: 04/27/1950  03/31/2019  Olivia Dougherty was observed post Covid-19 immunization for 15 minutes without incident. She was provided with Vaccine Information Sheet and instruction to access the V-Safe system.   Olivia Dougherty was instructed to call 911 with any severe reactions post vaccine: Marland Kitchen Difficulty breathing  . Swelling of face and throat  . A fast heartbeat  . A bad rash all over body  . Dizziness and weakness   Immunizations Administered    Name Date Dose VIS Date Route   Pfizer COVID-19 Vaccine 03/31/2019 10:11 AM 0.3 mL 01/07/2019 Intramuscular   Manufacturer: [place]   Lot: [id number]   [place]: [id number]

## 2019-04-10 ENCOUNTER — Other Ambulatory Visit: Payer: Self-pay | Admitting: Family Medicine

## 2019-04-10 NOTE — Telephone Encounter (Signed)
Requested Prescriptions  Pending Prescriptions Disp Refills  . levothyroxine (SYNTHROID) 75 MCG tablet [Pharmacy Med Name: LEVOTHYROXINE 75 MCG TABLET] 90 tablet 0    Sig: TAKE ONE TABLET BY MOUTH DAILY BEFORE BREAKFAST     Endocrinology:  Hypothyroid Agents Failed - 04/10/2019 12:51 PM      Failed - TSH needs to be rechecked within 3 months after an abnormal result. Refill until TSH is due.      Failed - Valid encounter within last 12 months    Recent Outpatient Visits          4 months ago Essential hypertension   [facility] [place], Dionne Bucy, MD   1 year ago Essential hypertension   [place], Dionne Bucy, MD   1 year ago [facility] preventive visit   [facility], Dionne Bucy, MD   1 year ago Atherosclerosis of native coronary artery of native heart without angina pectoris   [place], MD      Future Appointments            In 2 months Bacigalupo, Dionne Bucy, MD [facility]           Passed - TSH in normal range and within 360 days    TSH  Date Value Ref Range Status  12/13/2018 1.310 0.450 - 4.500 uIU/mL Final

## 2019-04-10 NOTE — Telephone Encounter (Signed)
Courtesy refill- refilled until appt due Requested Prescriptions  Pending Prescriptions Disp Refills  . levothyroxine (SYNTHROID) 75 MCG tablet [Pharmacy Med Name: LEVOTHYROXINE 75 MCG TABLET] 90 tablet 0    Sig: TAKE ONE TABLET BY MOUTH DAILY BEFORE BREAKFAST     Endocrinology:  Hypothyroid Agents Failed - 04/10/2019 12:51 PM      Failed - TSH needs to be rechecked within 3 months after an abnormal result. Refill until TSH is due.      Failed - Valid encounter within last 12 months    Recent Outpatient Visits          4 months ago Essential hypertension   [facility] [place], Dionne Bucy, MD   1 year ago Essential hypertension   [place], Dionne Bucy, MD   1 year ago [facility] preventive visit   [facility], Dionne Bucy, MD   1 year ago Atherosclerosis of native coronary artery of native heart without angina pectoris   [place], MD      Future Appointments            In 2 months Bacigalupo, Dionne Bucy, MD [facility]           Passed - TSH in normal range and within 360 days    TSH  Date Value Ref Range Status  12/13/2018 1.310 0.450 - 4.500 uIU/mL Final

## 2019-04-21 ENCOUNTER — Ambulatory Visit: Payer: Medicare Other | Attending: Internal Medicine

## 2019-04-21 DIAGNOSIS — Z23 Encounter for immunization: Secondary | ICD-10-CM

## 2019-04-21 NOTE — Progress Notes (Signed)
   Covid-19 Vaccination Clinic  Name:  Olivia Dougherty    MRN: [id number] DOB: 04-21-1950  04/21/2019  [name] was observed post Covid-19 immunization for 15 minutes without incident. She was provided with Vaccine Information Sheet and instruction to access the V-Safe system.   [name] was instructed to call 911 with any severe reactions post vaccine: Marland Kitchen Difficulty breathing  . Swelling of face and throat  . A fast heartbeat  . A bad rash all over body  . Dizziness and weakness   Immunizations Administered    Name Date Dose VIS Date Route   Pfizer COVID-19 Vaccine 04/21/2019 10:03 AM 0.3 mL 01/07/2019 Intramuscular   Manufacturer: [employer]   Lot: [id number]   [place]: [id number]

## 2019-05-11 DIAGNOSIS — H33021 Retinal detachment with multiple breaks, right eye: Secondary | ICD-10-CM | POA: Diagnosis not present

## 2019-05-11 DIAGNOSIS — H43812 Vitreous degeneration, left eye: Secondary | ICD-10-CM | POA: Diagnosis not present

## 2019-06-09 ENCOUNTER — Ambulatory Visit (INDEPENDENT_AMBULATORY_CARE_PROVIDER_SITE_OTHER): Payer: Medicare Other | Admitting: Family Medicine

## 2019-06-09 ENCOUNTER — Encounter: Payer: Self-pay | Admitting: Family Medicine

## 2019-06-09 ENCOUNTER — Other Ambulatory Visit: Payer: Self-pay

## 2019-06-09 VITALS — BP 128/69 | HR 54 | Temp 96.9°F | Wt 226.0 lb

## 2019-06-09 DIAGNOSIS — E559 Vitamin D deficiency, unspecified: Secondary | ICD-10-CM

## 2019-06-09 DIAGNOSIS — I10 Essential (primary) hypertension: Secondary | ICD-10-CM | POA: Diagnosis not present

## 2019-06-09 DIAGNOSIS — E7800 Pure hypercholesterolemia, unspecified: Secondary | ICD-10-CM | POA: Diagnosis not present

## 2019-06-09 DIAGNOSIS — E039 Hypothyroidism, unspecified: Secondary | ICD-10-CM | POA: Diagnosis not present

## 2019-06-09 NOTE — Patient Instructions (Signed)

## 2019-06-09 NOTE — Assessment & Plan Note (Addendum)
Previously well controlled Continue statin Will recheck Lipid panel at her physical in six months.

## 2019-06-09 NOTE — Assessment & Plan Note (Signed)
Well controlled at recent check.  Pt reports increased fatigued. Will Recheck TSH today.

## 2019-06-09 NOTE — Assessment & Plan Note (Signed)
Discussed the importance of a healthy lifestyle including diet and exercise.

## 2019-06-09 NOTE — Assessment & Plan Note (Signed)
Well controlled Continue current medications Recheck metabolic panel F/u in 6 months at her physical  

## 2019-06-09 NOTE — Assessment & Plan Note (Signed)
Pt is taking high dose Vitamin D every 14 days.  Pt reports increased fatigued.  Will recheck vitamin D level today.

## 2019-06-09 NOTE — Progress Notes (Signed)
I,Laura E Walsh,acting as a scribe for [name], MD.,have documented all relevant documentation on the behalf of [name], MD,as directed by  [name], MD while in the presence of [name], MD.   Established patient visit   Patient: Olivia Dougherty   DOB: 1950-09-27   69 y.o. Female  MRN: [id number] Visit Date: 06/09/2019  Today's healthcare provider: [name], MD   Chief Complaint  Patient presents with  . Hypertension  . Hyperlipidemia  . Hypothyroidism   Subjective    HPI   Hypertension, follow-up  BP Readings from Last 3 Encounters:  06/09/19 128/69  12/10/18 133/76  11/11/18 (!) 150/72   Wt Readings from Last 3 Encounters:  06/09/19 226 lb (102.5 kg)  12/10/18 224 lb 12.8 oz (102 kg)  11/11/18 225 lb (102.1 kg)     She was last seen for hypertension 6 months ago.  BP at that visit was 133/76. Management since that visit includes no changes.  She reports excellent compliance with treatment. She is not having side effects.  She is following a Regular diet. She is not exercising. She does not smoke.  Use of agents associated with hypertension: none.   Outside blood pressures are being checked occasionally.  Pt states it is normal at home.  Symptoms: No chest pain No chest pressure  No palpitations No syncope  No dyspnea No orthopnea  No paroxysmal nocturnal dyspnea No lower extremity edema   Pertinent labs: Lab Results  Component Value Date   CHOL 165 12/13/2018   HDL 63 12/13/2018   LDLCALC 84 12/13/2018   TRIG 97 12/13/2018   CHOLHDL 2.8 05/21/2017   Lab Results  Component Value Date   NA 142 12/13/2018   K 4.0 12/13/2018   CREATININE 0.74 12/13/2018   GFRNONAA 85 12/13/2018   GFRAA 98 12/13/2018   GLUCOSE 94 12/13/2018     The 10-year ASCVD risk score Mikey Bussing DC Jr., et al., 2013) is: 7.2%    --------------------------------------------------------------------------------------------------- Lipid/Cholesterol, Follow-up  Last lipid panel Other pertinent labs  Lab Results  Component Value Date   CHOL 165 12/13/2018   HDL 63 12/13/2018   LDLCALC 84 12/13/2018   TRIG 97 12/13/2018   CHOLHDL 2.8 05/21/2017   Lab Results  Component Value Date   ALT 25 12/13/2018   AST 28 12/13/2018   PLT 198 12/13/2018   TSH 1.310 12/13/2018     She was last seen for this 6 months ago.  Management since that visit includes no changes.  She reports excellent compliance with treatment. She is not having side effects.   Symptoms: No chest pain No chest pressure/discomfort  No dyspnea No lower extremity edema  No numbness or tingling of extremity No orthopnea  No palpitations No paroxysmal nocturnal dyspnea  No speech difficulty No syncope   Current diet: in general, a "healthy" diet   Current exercise: none  The 10-year ASCVD risk score Mikey Bussing DC Jr., et al., 2013) is: 7.2%  --------------------------------------------------------------------------------------------------- Hypothyroid, follow-up  Lab Results  Component Value Date   TSH 1.310 12/13/2018   TSH 0.951 12/01/2017   Wt Readings from Last 3 Encounters:  06/09/19 226 lb (102.5 kg)  12/10/18 224 lb 12.8 oz (102 kg)  11/11/18 225 lb (102.1 kg)    She was last seen for hypothyroid 6 months ago.  Management since that visit includes no changes. She reports excellent compliance with treatment. She is not having side effects.   Symptoms: Yes change in  energy level Yes constipation No diarrhea Yes heat / cold intolerance Yes nervousness No palpitations No weight changes  -----------------------------------------------------------------------------------------    Patient Active Problem List   Diagnosis Date Noted  . Polyarthralgia 12/10/2018  . Avitaminosis D 12/10/2018  . Personal history of colonic  polyps   . Acute right-sided low back pain without sciatica 12/02/2017  . Morbid obesity ([place]) 09/21/2017  . Essential hypertension 08/10/2017  . Hyperlipidemia 08/10/2017  . OSA (obstructive sleep apnea) 08/10/2017  . Allergic rhinitis 08/10/2017  . GAD (generalized anxiety disorder) 08/10/2017  . GERD (gastroesophageal reflux disease) 08/10/2017  . Special screening for malignant neoplasms, colon   . Benign neoplasm of ascending colon   . Benign neoplasm of descending colon   . Current tear knee, medial meniscus 05/25/2014  . Arthritis of knee, degenerative 05/25/2014  . Squamous cell carcinoma 01/13/2014  . History of basal cell carcinoma 01/13/2014  . Osteoarthritis 07/29/2011  . Coronary atherosclerosis of native coronary artery 01/01/2006  . Acquired hypothyroidism 01/01/2006   Social History   Tobacco Use  . Smoking status: Never Smoker  . Smokeless tobacco: Never Used  Substance Use Topics  . Alcohol use: Yes    Alcohol/week: 2.0 standard drinks    Types: 2 Glasses of wine per week  . Drug use: No   Allergies  Allergen Reactions  . Atorvastatin Other (See Comments)    Other reaction(s): myalgia  . Codeine Shortness Of Breath  . Ace Inhibitors Cough       . Enalapril Cough  . Penicillins Hives       . Sulfa Antibiotics Hives and Swelling       . Adhesive [Tape] Rash          Medications: Outpatient Medications Prior to Visit  Medication Sig  . aspirin 81 MG tablet Take 81 mg by mouth daily.  . clopidogrel (PLAVIX) 75 MG tablet TAKE ONE TABLET BY MOUTH DAILY WITH BREAKFAST  . cyclobenzaprine (FLEXERIL) 5 MG tablet TAKE ONE TABLET BY MOUTH THREE TIMES A DAY AS NEEDED FOR MUSCLE SPASMS  . levothyroxine (SYNTHROID) 75 MCG tablet TAKE ONE TABLET BY MOUTH DAILY BEFORE BREAKFAST  . losartan (COZAAR) 50 MG tablet TAKE TWO TABLETS BY MOUTH DAILY  . metoprolol tartrate (LOPRESSOR) 25 MG tablet TAKE ONE TABLET BY MOUTH TWICE A DAY  . Multiple Vitamins-Minerals  (MULTIVITAMIN PO) Take by mouth daily.  . rosuvastatin (CRESTOR) 10 MG tablet TAKE ONE TABLET BY MOUTH DAILY  . Vitamin D, Ergocalciferol, (DRISDOL) 1.25 MG (50000 UT) CAPS capsule TAKE 1 CAPSULE BY MOUTH EVERY 14 DAYS  . [DISCONTINUED] fluticasone (FLONASE) 50 MCG/ACT nasal spray Place 2 sprays into both nostrils daily. As needed / seasonal  . [DISCONTINUED] loratadine (CLARITIN) 10 MG tablet Take 10 mg by mouth daily as needed.   . [DISCONTINUED] meloxicam (MOBIC) 7.5 MG tablet Take 1 tablet (7.5 mg total) by mouth 2 (two) times daily. As needed  . [DISCONTINUED] traZODone (DESYREL) 50 MG tablet Take 0.5-1 tablets (25-50 mg total) by mouth at bedtime as needed for sleep.   No facility-administered medications prior to visit.    Review of Systems  Constitutional: Positive for fatigue. Negative for activity change, appetite change, chills, diaphoresis, fever and unexpected weight change.  Respiratory: Negative.   Cardiovascular: Negative.   Gastrointestinal: Positive for constipation. Negative for abdominal distention, abdominal pain, anal bleeding, blood in stool, diarrhea, nausea, rectal pain and vomiting.  Endocrine: Positive for heat intolerance. Negative for cold intolerance, polydipsia, polyphagia and polyuria.  Neurological: Negative for dizziness, light-headedness and headaches.    Last CBC Lab Results  Component Value Date   WBC 5.2 12/13/2018   HGB 13.7 12/13/2018   HCT 39.5 12/13/2018   MCV 92 12/13/2018   MCH 31.9 12/13/2018   RDW 11.9 12/13/2018   PLT 198 [id number]   Last metabolic panel Lab Results  Component Value Date   GLUCOSE 94 12/13/2018   NA 142 12/13/2018   K 4.0 12/13/2018   CL 105 12/13/2018   CO2 24 12/13/2018   BUN 14 12/13/2018   CREATININE 0.74 12/13/2018   GFRNONAA 85 12/13/2018   GFRAA 98 12/13/2018   CALCIUM 9.2 12/13/2018   PROT 6.5 12/13/2018   ALBUMIN 3.9 12/13/2018   LABGLOB 2.6 12/13/2018   AGRATIO 1.5 12/13/2018   BILITOT 0.7  12/13/2018   ALKPHOS 90 12/13/2018   AST 28 12/13/2018   ALT 25 12/13/2018   ANIONGAP 6 08/10/2014   Last lipids Lab Results  Component Value Date   CHOL 165 12/13/2018   HDL 63 12/13/2018   LDLCALC 84 12/13/2018   TRIG 97 12/13/2018   CHOLHDL 2.8 05/21/2017   Last hemoglobin A1c No results found for: HGBA1C Last thyroid functions Lab Results  Component Value Date   TSH 1.310 12/13/2018   Last vitamin D Lab Results  Component Value Date   VD25OH 32.9 12/13/2018    Objective    BP 128/69 (BP Location: Left Arm, Patient Position: Sitting, Cuff Size: Normal)   Pulse (!) 54   Temp (!) 96.9 F (36.1 C) (Temporal)   Wt 226 lb (102.5 kg)   LMP 01/28/2007 (Approximate)   BMI 41.34 kg/m  BP Readings from Last 3 Encounters:  06/09/19 128/69  12/10/18 133/76  11/11/18 (!) 150/72    Physical Exam Constitutional:      Appearance: Normal appearance. She is normal weight.  Cardiovascular:     Rate and Rhythm: Normal rate and regular rhythm.     Pulses: Normal pulses.     Heart sounds: Normal heart sounds. No murmur.  Pulmonary:     Effort: Pulmonary effort is normal. No respiratory distress.     Breath sounds: Normal breath sounds. No wheezing or rhonchi.  Musculoskeletal:     Right lower leg: No edema.     Left lower leg: No edema.  Neurological:     Mental Status: She is oriented to person, place, and time. Mental status is at baseline.     Gait: Gait normal.  Psychiatric:        Mood and Affect: Mood normal.        Behavior: Behavior normal.        Thought Content: Thought content normal.        Judgment: Judgment normal.     No results found for any visits on 06/09/19.  Assessment & Plan     Problem List Items Addressed This Visit      Cardiovascular and Mediastinum   Essential hypertension - Primary    Well controlled Continue current medications Recheck metabolic panel F/u in 6 months at her physical.      Relevant Orders   Basic Metabolic Panel  (BMET)     Endocrine   Acquired hypothyroidism    Well controlled at recent check.  Pt reports increased fatigued. Will Recheck TSH today.        Relevant Orders   TSH     Other   Hyperlipidemia    Previously well controlled Continue statin Will  recheck Lipid panel at her physical in six months.        Morbid obesity ([place])    Discussed the importance of a healthy lifestyle including diet and exercise.        Avitaminosis D    Pt is taking high dose Vitamin D every 14 days.  Pt reports increased fatigued.  Will recheck vitamin D level today.        Relevant Orders   Vitamin D (25 hydroxy)       Return in about 6 months (around 12/10/2019) for Annual Wellness Visit and follow up.      I, [name], MD, have reviewed all documentation for this visit. The documentation on 06/09/19 for the exam, diagnosis, procedures, and orders are all accurate and complete.   Lucielle Vokes, Dionne Bucy, MD, MPH [facility]

## 2019-06-10 ENCOUNTER — Telehealth: Payer: Self-pay

## 2019-06-10 LAB — BASIC METABOLIC PANEL
BUN/Creatinine Ratio: 17 (ref 12–28)
BUN: 13 mg/dL (ref 8–27)
CO2: 25 mmol/L (ref 20–29)
Calcium: 9.6 mg/dL (ref 8.7–10.3)
Chloride: 105 mmol/L (ref 96–106)
Creatinine, Ser: 0.75 mg/dL (ref 0.57–1.00)
GFR calc Af Amer: 96 mL/min/{1.73_m2} (ref >59)
GFR calc non Af Amer: 83 mL/min/{1.73_m2} (ref >59)
Glucose: 91 mg/dL (ref 65–99)
Potassium: 4.3 mmol/L (ref 3.5–5.2)
Sodium: 141 mmol/L (ref 134–144)

## 2019-06-10 LAB — VITAMIN D 25 HYDROXY (VIT D DEFICIENCY, FRACTURES): Vit D, 25-Hydroxy: 38.3 ng/mL (ref 30.0–100.0)

## 2019-06-10 LAB — TSH: TSH: 1.46 u[IU]/mL (ref 0.450–4.500)

## 2019-06-10 NOTE — Telephone Encounter (Signed)
-----   Message from Virginia Crews, MD sent at 06/10/2019 10:08 AM EDT ----- Normal labs

## 2019-06-10 NOTE — Telephone Encounter (Signed)
Pt advised.   Thanks,   -Alithea Lapage  

## 2019-06-15 ENCOUNTER — Other Ambulatory Visit: Payer: Self-pay

## 2019-06-16 ENCOUNTER — Other Ambulatory Visit: Payer: Self-pay

## 2019-06-16 ENCOUNTER — Ambulatory Visit (INDEPENDENT_AMBULATORY_CARE_PROVIDER_SITE_OTHER): Payer: Medicare Other | Admitting: Obstetrics & Gynecology

## 2019-06-16 ENCOUNTER — Ambulatory Visit (INDEPENDENT_AMBULATORY_CARE_PROVIDER_SITE_OTHER): Payer: Medicare Other

## 2019-06-16 ENCOUNTER — Encounter: Payer: Self-pay | Admitting: Obstetrics & Gynecology

## 2019-06-16 VITALS — BP 116/72 | HR 60 | Temp 97.5°F | Ht 62.0 in | Wt 226.4 lb

## 2019-06-16 DIAGNOSIS — N83201 Unspecified ovarian cyst, right side: Secondary | ICD-10-CM

## 2019-06-16 NOTE — Progress Notes (Signed)
69 y.o. [id number] Married White or Caucasian female here for pelvic ultrasound due to h/o rigth ovarian cyst.  This was noted on ultrasound performed due to pelvic throbbing sensation pt was experiencing at that time.  Pt had detached retina earlier this year.  Had surgery.  Vision is still not clear in the right eye.  Advised she has a cataract that needs to be removed.  Waiting for eye to heal before proceeding.    Denies vaginal bleeding or pelvic pain.  Patient's last menstrual period was 01/28/2007 (approximate).  Contraception: PMP  Findings:  UTERUS: 6.1 x 4.2 x 2.9cm EMS: 3.86mm ADNEXA: Left ovary:  Could not be visualized today on exam due to overlying bowel gas       Right ovary: 1.4 x 1.3 x 1.2xm with 1.2 x 1.1cm simple, avascular cyst CUL DE SAC: no free fluid  Discussion:  Pt had Ca-125 of 7.0 on 11/11/2018.  Ovarian cyst is stable in size.  Recommendations for follow up discussed with follow up for 2 years recommended.  However, given small size and benign appearance as well as normal ca-125 in October, I really feel this does not need to be followed any further unless she has new onset of symptoms.  Pt comfortable with plan.  Assessment:  Right simple ovarian cyst, unchanged in size since 10/2018.  Plan:  Will stop following this and would recommend repeat PUS with new symptoms.

## 2019-07-01 DIAGNOSIS — H2513 Age-related nuclear cataract, bilateral: Secondary | ICD-10-CM | POA: Diagnosis not present

## 2019-07-11 ENCOUNTER — Other Ambulatory Visit: Payer: Self-pay | Admitting: Family Medicine

## 2019-07-15 DIAGNOSIS — L309 Dermatitis, unspecified: Secondary | ICD-10-CM | POA: Diagnosis not present

## 2019-07-15 DIAGNOSIS — Z85828 Personal history of other malignant neoplasm of skin: Secondary | ICD-10-CM | POA: Diagnosis not present

## 2019-07-15 DIAGNOSIS — Z872 Personal history of diseases of the skin and subcutaneous tissue: Secondary | ICD-10-CM | POA: Diagnosis not present

## 2019-07-15 DIAGNOSIS — Z08 Encounter for follow-up examination after completed treatment for malignant neoplasm: Secondary | ICD-10-CM | POA: Diagnosis not present

## 2019-07-15 DIAGNOSIS — L570 Actinic keratosis: Secondary | ICD-10-CM | POA: Diagnosis not present

## 2019-07-15 DIAGNOSIS — X32XXXA Exposure to sunlight, initial encounter: Secondary | ICD-10-CM | POA: Diagnosis not present

## 2019-07-18 ENCOUNTER — Encounter: Payer: Self-pay | Admitting: Family Medicine

## 2019-07-18 ENCOUNTER — Ambulatory Visit (INDEPENDENT_AMBULATORY_CARE_PROVIDER_SITE_OTHER): Payer: Medicare Other | Admitting: Family Medicine

## 2019-07-18 ENCOUNTER — Other Ambulatory Visit: Payer: Self-pay

## 2019-07-18 VITALS — BP 132/76 | HR 66 | Temp 96.1°F | Resp 16 | Wt 227.0 lb

## 2019-07-18 DIAGNOSIS — N3090 Cystitis, unspecified without hematuria: Secondary | ICD-10-CM | POA: Diagnosis not present

## 2019-07-18 LAB — POCT URINALYSIS DIPSTICK
Bilirubin, UA: NEGATIVE
Glucose, UA: NEGATIVE
Ketones, UA: NEGATIVE
Nitrite, UA: NEGATIVE
Protein, UA: NEGATIVE
Spec Grav, UA: <=1.005 — AB (ref 1.010–1.025)
Urobilinogen, UA: 0.2 E.U./dL
pH, UA: 6 (ref 5.0–8.0)

## 2019-07-18 NOTE — Patient Instructions (Signed)

## 2019-07-18 NOTE — Progress Notes (Signed)
Established patient visit   Patient: Olivia Dougherty   DOB: November 29, 1950   69 y.o. Female  MRN: [id number] Visit Date: 07/18/2019  Today's healthcare provider: Vernie Murders, PA   Chief Complaint  Patient presents with  . Dysuria   Subjective    Dysuria  This is a new problem. The current episode started in the past 7 days. The problem occurs every urination. The problem has been gradually improving. There has been no fever. Associated symptoms include frequency, hesitancy and urgency. Pertinent negatives include no chills, discharge, flank pain, hematuria, nausea, possible pregnancy, sweats or vomiting. Treatments tried: cranberrry juice. The treatment provided mild relief.    Patient Active Problem List   Diagnosis Date Noted  . Polyarthralgia 12/10/2018  . Avitaminosis D 12/10/2018  . Personal history of colonic polyps   . Acute right-sided low back pain without sciatica 12/02/2017  . Morbid obesity ([place]) 09/21/2017  . Essential hypertension 08/10/2017  . Hyperlipidemia 08/10/2017  . OSA (obstructive sleep apnea) 08/10/2017  . Allergic rhinitis 08/10/2017  . GAD (generalized anxiety disorder) 08/10/2017  . GERD (gastroesophageal reflux disease) 08/10/2017  . Special screening for malignant neoplasms, colon   . Benign neoplasm of ascending colon   . Benign neoplasm of descending colon   . Current tear knee, medial meniscus 05/25/2014  . Arthritis of knee, degenerative 05/25/2014  . Squamous cell carcinoma 01/13/2014  . History of basal cell carcinoma 01/13/2014  . Osteoarthritis 07/29/2011  . Coronary atherosclerosis of native coronary artery 01/01/2006  . Acquired hypothyroidism 01/01/2006   Past Medical History:  Diagnosis Date  . Anxiety   . Arthritis    lower back  . Basal cell carcinoma 11/2011   face, left leg-using chemo cream on temple area  . Coronary artery disease 2005   LAD PCI with Cypher drug-eluting stent placement 3.0 x 13 mm with  balloon angioplasty of diagonal.  Cardiac catheterization in 2010 showed patent stent with minimal restenosis.  Marland Kitchen GERD (gastroesophageal reflux disease)   . Heart disease   . Heart problem    98% blockage  . Hypercholesteremia   . Hypertension   . Hypothyroidism   . Kidney stone   . Osteoarthritis   . PONV (postoperative nausea and vomiting)    after 1 procedure approx 10 yrs ago  . Sleep apnea    mild, could not tol cpap  . Squamous cell carcinoma of skin    leg   Past Surgical History:  Procedure Laterality Date  . ANGIOPLASTY  2005   CAD   98% blockage  . CARDIAC CATHETERIZATION  02/2008  . CATARACT EXTRACTION Right   . COLONOSCOPY WITH PROPOFOL N/A 12/29/2014   Procedure: COLONOSCOPY WITH PROPOFOL;  Surgeon: Lucilla Lame, MD;  Location: [facility];  Service: Endoscopy;  Laterality: N/A;  . COLONOSCOPY WITH PROPOFOL N/A 12/31/2017   Procedure: COLONOSCOPY WITH PROPOFOL;  Surgeon: Lucilla Lame, MD;  Location: [facility];  Service: Endoscopy;  Laterality: N/A;  sleep apnea  . CORONARY ANGIOPLASTY  2005   LAD  . EYE SURGERY Right   . HYSTEROSCOPY  11/2001   resection of benign leiomyoma  . KNEE ARTHROSCOPY Right 08/16/2014   Procedure: ARTHROSCOPY KNEE, partial lateral menisectomy and chondroplasty;  Surgeon: Dereck Leep, MD;  Location: [facility];  Service: Orthopedics;  Laterality: Right;  . POLYPECTOMY  12/29/2014   Procedure: POLYPECTOMY;  Surgeon: Lucilla Lame, MD;  Location: [facility] [place];  Service: Endoscopy;;  . SKIN CANCER EXCISION Right  02/2018   Right leg - Squamous   . THYROID CYST EXCISION Left 10/2002   left lobe thyroid excision  . THYROIDECTOMY, PARTIAL Left   . TUBAL LIGATION  1980   BTL  . UMBILICAL HERNIA REPAIR  1980  . vulvar cancer  1993   laser   Family History  Problem Relation Age of Onset  . Hypertension Mother   . Hypertension Father   . Diabetes Father   . Heart disease Father   . Cervical cancer Sister   . Stroke  Sister   . Colon cancer Neg Hx   . Breast cancer Neg Hx   . Ovarian cancer Neg Hx    Social History   Tobacco Use  . Smoking status: Never Smoker  . Smokeless tobacco: Never Used  Vaping Use  . Vaping Use: Never used  Substance Use Topics  . Alcohol use: Yes    Alcohol/week: 2.0 standard drinks    Types: 2 Glasses of wine per week  . Drug use: No   Allergies  Allergen Reactions  . Atorvastatin Other (See Comments)    Other reaction(s): myalgia  . Codeine Shortness Of Breath  . Ace Inhibitors Cough       . Enalapril Cough  . Penicillins Hives       . Sulfa Antibiotics Hives and Swelling       . Adhesive [Tape] Rash        Medications: Outpatient Medications Prior to Visit  Medication Sig  . aspirin 81 MG tablet Take 81 mg by mouth daily.  . clopidogrel (PLAVIX) 75 MG tablet TAKE ONE TABLET BY MOUTH DAILY WITH BREAKFAST  . cyclobenzaprine (FLEXERIL) 5 MG tablet TAKE ONE TABLET BY MOUTH THREE TIMES A DAY AS NEEDED FOR MUSCLE SPASMS  . diclofenac Sodium (VOLTAREN) 1 % GEL Apply topically as needed.  Marland Kitchen levothyroxine (SYNTHROID) 75 MCG tablet Take 1 tablet (75 mcg total) by mouth daily before breakfast.  . losartan (COZAAR) 50 MG tablet TAKE TWO TABLETS BY MOUTH DAILY  . metoprolol tartrate (LOPRESSOR) 25 MG tablet TAKE ONE TABLET BY MOUTH TWICE A DAY  . Multiple Vitamins-Minerals (MULTIVITAMIN PO) Take by mouth daily.  . rosuvastatin (CRESTOR) 10 MG tablet TAKE ONE TABLET BY MOUTH DAILY  . Vitamin D, Ergocalciferol, (DRISDOL) 1.25 MG (50000 UT) CAPS capsule TAKE 1 CAPSULE BY MOUTH EVERY 14 DAYS   No facility-administered medications prior to visit.    Review of Systems  Constitutional: Negative for chills.  Gastrointestinal: Negative for nausea and vomiting.  Genitourinary: Positive for dysuria, frequency, hesitancy and urgency. Negative for flank pain and hematuria.  Musculoskeletal: Positive for back pain.    Objective    BP 132/76   Pulse 66   Temp (!) 96.1  F (35.6 C) (Oral)   Resp 16   Wt 227 lb (103 kg)   LMP 01/28/2007 (Approximate)   SpO2 98%   BMI 41.52 kg/m   Physical Exam Constitutional:      General: She is not in acute distress.    Appearance: She is well-developed.  HENT:     Head: Normocephalic and atraumatic.     Right Ear: Hearing normal.     Left Ear: Hearing normal.     Nose: Nose normal.  Eyes:     General: Lids are normal. No scleral icterus.       Right eye: No discharge.        Left eye: No discharge.     Conjunctiva/sclera: Conjunctivae normal.  Cardiovascular:     Rate and Rhythm: Normal rate and regular rhythm.     Heart sounds: Normal heart sounds.  Pulmonary:     Effort: Pulmonary effort is normal. No respiratory distress.     Breath sounds: Normal breath sounds.  Abdominal:     General: Bowel sounds are normal.     Palpations: Abdomen is soft.     Tenderness: There is no right CVA tenderness or left CVA tenderness.  Musculoskeletal:        General: Normal range of motion.     Cervical back: Neck supple.  Skin:    Findings: No lesion or rash.  Neurological:     Mental Status: She is alert and oriented to person, place, and time.  Psychiatric:        Speech: Speech normal.        Behavior: Behavior normal.        Thought Content: Thought content normal.      No results found for any visits on 07/18/19.  Assessment & Plan     1. Cystitis Developed some urinary frequency and dysuria 3 days ago. Increased water in take and added cranberry juice. This calmed all symptoms and almost seems to be gone now. No fever, hematuria or bladder spasms today. No CVA tenderness to percussion. Lumbar spine x-rays on 12-01-17 showed a subcentimeter density in the right lower kidney suspected to be an asymptomatic stone. Will continue present treatment since she is not having any discomfort today. Urinalysis shows a few WBC's per hpf. Will get urine C&S started and may use AZO-Standard prn while waiting for the  report.  - POCT urinalysis dipstick - Urine Culture   No follow-ups on file.      Andres Shad, PA, have reviewed all documentation for this visit. The documentation on 07/18/19 for the exam, diagnosis, procedures, and orders are all accurate and complete.    Vernie Murders, [place] [phone] (phone) [phone] (fax)  [place]

## 2019-07-20 ENCOUNTER — Telehealth: Payer: Self-pay | Admitting: Family Medicine

## 2019-07-20 LAB — URINE CULTURE

## 2019-07-20 NOTE — Telephone Encounter (Signed)
Patient calling to check status of urinalysis from 6/21

## 2019-07-21 ENCOUNTER — Telehealth: Payer: Self-pay

## 2019-07-21 DIAGNOSIS — N3000 Acute cystitis without hematuria: Secondary | ICD-10-CM

## 2019-07-21 MED ORDER — DOXYCYCLINE HYCLATE 100 MG PO TABS: 100.0000 mg | ORAL_TABLET | Freq: Two times a day (BID) | ORAL | 0 refills | Status: DC

## 2019-07-21 NOTE — Telephone Encounter (Signed)
-----   Message from Margo Common, [place] sent at 07/21/2019 10:31 AM EDT ----- Culture isolated E.coli bacteria that is sensitive to Doxycycline 100 mg BID #20. Increase in fluid intake and recheck after finishing the antibiotic if needed.

## 2019-07-21 NOTE — Telephone Encounter (Signed)
Copied from [place] [id number]. Topic: General - Other >> Jul 21, 2019  9:34 AM Hinda Lenis D wrote: Reason for CRM: PT is waiting for her UTI prescription / please advise

## 2019-07-22 NOTE — Telephone Encounter (Signed)
Pt advised.  See previous phone note.   Thanks,   -Mickel Baas

## 2019-07-22 NOTE — Telephone Encounter (Signed)
Doxy sent in yesterday.

## 2019-08-18 DIAGNOSIS — I10 Essential (primary) hypertension: Secondary | ICD-10-CM | POA: Diagnosis not present

## 2019-08-18 DIAGNOSIS — H2511 Age-related nuclear cataract, right eye: Secondary | ICD-10-CM | POA: Diagnosis not present

## 2019-08-23 ENCOUNTER — Encounter: Payer: Self-pay | Admitting: Ophthalmology

## 2019-08-23 ENCOUNTER — Other Ambulatory Visit: Payer: Self-pay

## 2019-08-29 ENCOUNTER — Other Ambulatory Visit: Payer: Self-pay

## 2019-08-29 ENCOUNTER — Other Ambulatory Visit
Admission: RE | Admit: 2019-08-29 | Discharge: 2019-08-29 | Disposition: A | Discharge status: Home / Self Care | Payer: Medicare Other | Source: Ambulatory Visit | Attending: Ophthalmology | Admitting: Ophthalmology

## 2019-08-29 DIAGNOSIS — Z20822 Contact with and (suspected) exposure to covid-19: Secondary | ICD-10-CM | POA: Insufficient documentation

## 2019-08-29 DIAGNOSIS — Z01812 Encounter for preprocedural laboratory examination: Secondary | ICD-10-CM | POA: Insufficient documentation

## 2019-08-29 LAB — SARS CORONAVIRUS 2 (TAT 6-24 HRS): SARS Coronavirus 2: NEGATIVE

## 2019-08-29 NOTE — Discharge Instructions (Signed)

## 2019-08-31 ENCOUNTER — Ambulatory Visit: Payer: Medicare Other | Admitting: Anesthesiology

## 2019-08-31 ENCOUNTER — Encounter: Payer: Self-pay | Admitting: Ophthalmology

## 2019-08-31 ENCOUNTER — Other Ambulatory Visit: Payer: Self-pay

## 2019-08-31 ENCOUNTER — Encounter
Admission: RE | Disposition: A | Discharge status: Home / Self Care | Payer: Self-pay | Source: Home / Self Care | Attending: Ophthalmology

## 2019-08-31 ENCOUNTER — Ambulatory Visit
Admission: RE | Admit: 2019-08-31 | Discharge: 2019-08-31 | Disposition: A | Discharge status: Home / Self Care | Payer: Medicare Other | Attending: Ophthalmology | Admitting: Ophthalmology

## 2019-08-31 DIAGNOSIS — F419 Anxiety disorder, unspecified: Secondary | ICD-10-CM | POA: Insufficient documentation

## 2019-08-31 DIAGNOSIS — Z7982 Long term (current) use of aspirin: Secondary | ICD-10-CM | POA: Insufficient documentation

## 2019-08-31 DIAGNOSIS — Z882 Allergy status to sulfonamides status: Secondary | ICD-10-CM | POA: Insufficient documentation

## 2019-08-31 DIAGNOSIS — E039 Hypothyroidism, unspecified: Secondary | ICD-10-CM | POA: Diagnosis not present

## 2019-08-31 DIAGNOSIS — Z955 Presence of coronary angioplasty implant and graft: Secondary | ICD-10-CM | POA: Diagnosis not present

## 2019-08-31 DIAGNOSIS — E7800 Pure hypercholesterolemia, unspecified: Secondary | ICD-10-CM | POA: Insufficient documentation

## 2019-08-31 DIAGNOSIS — I10 Essential (primary) hypertension: Secondary | ICD-10-CM | POA: Diagnosis not present

## 2019-08-31 DIAGNOSIS — Z885 Allergy status to narcotic agent status: Secondary | ICD-10-CM | POA: Diagnosis not present

## 2019-08-31 DIAGNOSIS — Z6841 Body Mass Index (BMI) 40.0 and over, adult: Secondary | ICD-10-CM | POA: Insufficient documentation

## 2019-08-31 DIAGNOSIS — Z7902 Long term (current) use of antithrombotics/antiplatelets: Secondary | ICD-10-CM | POA: Insufficient documentation

## 2019-08-31 DIAGNOSIS — H25811 Combined forms of age-related cataract, right eye: Secondary | ICD-10-CM | POA: Diagnosis not present

## 2019-08-31 DIAGNOSIS — Z888 Allergy status to other drugs, medicaments and biological substances status: Secondary | ICD-10-CM | POA: Insufficient documentation

## 2019-08-31 DIAGNOSIS — Z79890 Hormone replacement therapy (postmenopausal): Secondary | ICD-10-CM | POA: Diagnosis not present

## 2019-08-31 DIAGNOSIS — M1990 Unspecified osteoarthritis, unspecified site: Secondary | ICD-10-CM | POA: Insufficient documentation

## 2019-08-31 DIAGNOSIS — E785 Hyperlipidemia, unspecified: Secondary | ICD-10-CM | POA: Diagnosis not present

## 2019-08-31 DIAGNOSIS — Z85828 Personal history of other malignant neoplasm of skin: Secondary | ICD-10-CM | POA: Diagnosis not present

## 2019-08-31 DIAGNOSIS — Z880 Allergy status to penicillin: Secondary | ICD-10-CM | POA: Diagnosis not present

## 2019-08-31 DIAGNOSIS — H2511 Age-related nuclear cataract, right eye: Secondary | ICD-10-CM | POA: Diagnosis not present

## 2019-08-31 DIAGNOSIS — Z79899 Other long term (current) drug therapy: Secondary | ICD-10-CM | POA: Insufficient documentation

## 2019-08-31 DIAGNOSIS — I2510 Atherosclerotic heart disease of native coronary artery without angina pectoris: Secondary | ICD-10-CM | POA: Diagnosis not present

## 2019-08-31 DIAGNOSIS — G4730 Sleep apnea, unspecified: Secondary | ICD-10-CM | POA: Insufficient documentation

## 2019-08-31 HISTORY — PX: CATARACT EXTRACTION W/PHACO: SHX586

## 2019-08-31 SURGERY — PHACOEMULSIFICATION, CATARACT, WITH IOL INSERTION
Anesthesia: Monitor Anesthesia Care | Site: Eye | Laterality: Right

## 2019-08-31 MED ORDER — MOXIFLOXACIN HCL 0.5 % OP SOLN
OPHTHALMIC | Status: DC | PRN
  Administered 2019-08-31: 0.2 mL via OPHTHALMIC

## 2019-08-31 MED ORDER — ONDANSETRON HCL 4 MG/2ML IJ SOLN: 4.0000 mg | Freq: Once | INTRAMUSCULAR | Status: DC | PRN

## 2019-08-31 MED ORDER — ACETAMINOPHEN 160 MG/5ML PO SOLN: 325.0000 mg | ORAL | Status: DC | PRN

## 2019-08-31 MED ORDER — SODIUM HYALURONATE 23 MG/ML IO SOLN
INTRAOCULAR | Status: DC | PRN
  Administered 2019-08-31: 0.6 mL via INTRAOCULAR

## 2019-08-31 MED ORDER — LIDOCAINE HCL (PF) 2 % IJ SOLN
INTRAOCULAR | Status: DC | PRN
  Administered 2019-08-31: 1 mL via INTRAOCULAR

## 2019-08-31 MED ORDER — SODIUM HYALURONATE 10 MG/ML IO SOLN
INTRAOCULAR | Status: DC | PRN
  Administered 2019-08-31: 0.55 mL via INTRAOCULAR

## 2019-08-31 MED ORDER — FENTANYL CITRATE (PF) 100 MCG/2ML IJ SOLN
INTRAMUSCULAR | Status: DC | PRN
  Administered 2019-08-31: 50 ug via INTRAVENOUS

## 2019-08-31 MED ORDER — ACETAMINOPHEN 325 MG PO TABS: 325.0000 mg | ORAL_TABLET | ORAL | Status: DC | PRN

## 2019-08-31 MED ORDER — TETRACAINE HCL 0.5 % OP SOLN
1.0000 [drp] | OPHTHALMIC | Status: DC | PRN
  Administered 2019-08-31 (×3): 1 [drp] via OPHTHALMIC

## 2019-08-31 MED ORDER — MIDAZOLAM HCL 2 MG/2ML IJ SOLN
INTRAMUSCULAR | Status: DC | PRN
  Administered 2019-08-31 (×2): 1 mg via INTRAVENOUS

## 2019-08-31 MED ORDER — EPINEPHRINE PF 1 MG/ML IJ SOLN
INTRAOCULAR | Status: DC | PRN
  Administered 2019-08-31: 80 mL via OPHTHALMIC

## 2019-08-31 MED ORDER — ARMC OPHTHALMIC DILATING DROPS
1.0000 "application " | OPHTHALMIC | Status: DC | PRN
  Administered 2019-08-31 (×3): 1 via OPHTHALMIC

## 2019-08-31 SURGICAL SUPPLY — 20 items
CANNULA ANT/CHMB 27G (MISCELLANEOUS) ×2 IMPLANT
CANNULA ANT/CHMB 27GA (MISCELLANEOUS) ×4 IMPLANT
DISSECTOR HYDRO NUCLEUS 50X22 (MISCELLANEOUS) ×2 IMPLANT
GLOVE SURG LX 7.5 STRW (GLOVE) ×3
GLOVE SURG LX STRL 7.5 STRW (GLOVE) ×1 IMPLANT
GLOVE SURG SYN 8.5  E (GLOVE) ×2
GLOVE SURG SYN 8.5 E (GLOVE) ×1 IMPLANT
GLOVE SURG SYN 8.5 PF PI (GLOVE) ×1 IMPLANT
GOWN STRL REUS W/ TWL LRG LVL3 (GOWN DISPOSABLE) ×2 IMPLANT
GOWN STRL REUS W/TWL LRG LVL3 (GOWN DISPOSABLE) ×6
LENS IOL DIOP 15.0 (Intraocular Lens) ×2 IMPLANT
LENS IOL TECNIS MONO 15.0 (Intraocular Lens) IMPLANT
MARKER SKIN DUAL TIP RULER LAB (MISCELLANEOUS) ×2 IMPLANT
PACK DR. KING ARMS (PACKS) ×2 IMPLANT
PACK EYE AFTER SURG (MISCELLANEOUS) ×2 IMPLANT
PACK OPTHALMIC (MISCELLANEOUS) ×2 IMPLANT
SYR 3ML LL SCALE MARK (SYRINGE) ×2 IMPLANT
SYR TB 1ML LUER SLIP (SYRINGE) ×2 IMPLANT
WATER STERILE IRR 250ML POUR (IV SOLUTION) ×2 IMPLANT
WIPE NON LINTING 3.25X3.25 (MISCELLANEOUS) ×2 IMPLANT

## 2019-08-31 NOTE — Op Note (Signed)
OPERATIVE NOTE  [name] [id number] 08/31/2019   PREOPERATIVE DIAGNOSIS:  Nuclear sclerotic cataract right eye.  H25.11   POSTOPERATIVE DIAGNOSIS:    Nuclear sclerotic cataract right eye.     PROCEDURE:  Phacoemusification with posterior chamber intraocular lens placement of the right eye   LENS:   Implant Name Type Inv. Item Serial No. Manufacturer Lot No. LRB No. Used Action  LENS IOL DIOP 15.0 - [id number] Intraocular Lens LENS IOL DIOP 15.0 [id number] [employer]  Right 1 Implanted       Procedure(s) with comments: CATARACT EXTRACTION PHACO AND INTRAOCULAR LENS PLACEMENT (IOC) RIGHT (Right) - 4.41 0:30.7  DCB00 +15.0   ULTRASOUND TIME: 0 minutes 30 seconds.  CDE 4.41   SURGEON:  Benay Pillow, MD, MPH  ANESTHESIOLOGIST: Anesthesiologist: Veda Canning, MD CRNA: Silvana Newness, CRNA   ANESTHESIA:  Topical with tetracaine drops augmented with 1% preservative-free intracameral lidocaine.  ESTIMATED BLOOD LOSS: less than 1 mL.   COMPLICATIONS:  None.   DESCRIPTION OF PROCEDURE:  The patient was identified in the holding room and transported to the operating room and placed in the supine position under the operating microscope.  The right eye was identified as the operative eye and it was prepped and draped in the usual sterile ophthalmic fashion.   A 1.0 millimeter clear-corneal paracentesis was made at the 10:30 position. 0.5 ml of preservative-free 1% lidocaine with epinephrine was injected into the anterior chamber.  The anterior chamber was filled with Healon 5 viscoelastic.  A 2.4 millimeter keratome was used to make a near-clear corneal incision at the 8:00 position.  A curvilinear capsulorrhexis was made with a cystotome and capsulorrhexis forceps.  Balanced salt solution was used to hydrodissect and hydrodelineate the nucleus.   Phacoemulsification was then used in stop and chop fashion to remove the lens nucleus and epinucleus.  The remaining  cortex was then removed using the irrigation and aspiration handpiece. Healon was then placed into the capsular bag to distend it for lens placement.  A lens was then injected into the capsular bag.  The remaining viscoelastic was aspirated.   Wounds were hydrated with balanced salt solution.  The anterior chamber was inflated to a physiologic pressure with balanced salt solution.   Intracameral vigamox 0.1 mL undiluted was injected into the eye and a drop placed onto the ocular surface.  No wound leaks were noted.  The patient was taken to the recovery room in stable condition without complications of anesthesia or surgery  Benay Pillow 08/31/2019, 9:05 AM

## 2019-08-31 NOTE — Anesthesia Procedure Notes (Signed)
Procedure Name: MAC Date/Time: 08/31/2019 8:46 AM Performed by: Silvana Newness, CRNA Pre-anesthesia Checklist: Patient identified, Emergency Drugs available, Suction available, Patient being monitored and Timeout performed Patient Re-evaluated:Patient Re-evaluated prior to induction Placement Confirmation: positive ETCO2

## 2019-08-31 NOTE — Transfer of Care (Signed)
Immediate Anesthesia Transfer of Care Note  Patient: Olivia Dougherty  Procedure(s) Performed: CATARACT EXTRACTION PHACO AND INTRAOCULAR LENS PLACEMENT (IOC) RIGHT (Right Eye)  Patient Location: PACU  Anesthesia Type: MAC  Level of Consciousness: awake, alert  and patient cooperative  Airway and Oxygen Therapy: Patient Spontanous Breathing and Patient connected to supplemental oxygen  Post-op Assessment: Post-op Vital signs reviewed, Patient's Cardiovascular Status Stable, Respiratory Function Stable, Patent Airway and No signs of Nausea or vomiting  Post-op Vital Signs: Reviewed and stable  Complications: No complications documented.

## 2019-08-31 NOTE — Anesthesia Postprocedure Evaluation (Signed)
Anesthesia Post Note  Patient: Olivia Dougherty  Procedure(s) Performed: CATARACT EXTRACTION PHACO AND INTRAOCULAR LENS PLACEMENT (IOC) RIGHT (Right Eye)     Patient location during evaluation: PACU Anesthesia Type: MAC Level of consciousness: awake Pain management: pain level controlled Vital Signs Assessment: post-procedure vital signs reviewed and stable Respiratory status: respiratory function stable Cardiovascular status: stable Postop Assessment: no apparent nausea or vomiting Anesthetic complications: no   No complications documented.  Veda Canning

## 2019-08-31 NOTE — Anesthesia Preprocedure Evaluation (Signed)
Anesthesia Evaluation  Patient identified by MRN, date of birth, ID band Patient awake    Reviewed: Allergy & Precautions, NPO status   History of Anesthesia Complications (+) PONV  Airway Mallampati: II  TM Distance: >3 FB     Dental   Pulmonary sleep apnea , Recent URI: no cpap.,    breath sounds clear to auscultation       Cardiovascular hypertension,  Rhythm:Regular Rate:Normal  HLD   Neuro/Psych Anxiety    GI/Hepatic GERD  ,  Endo/Other  Hypothyroidism Morbid obesity (BMI 41)  Renal/GU      Musculoskeletal  (+) Arthritis ,   Abdominal   Peds  Hematology   Anesthesia Other Findings   Reproductive/Obstetrics                             Anesthesia Physical  Anesthesia Plan  ASA: III  Anesthesia Plan: MAC   Post-op Pain Management:    Induction: Intravenous  PONV Risk Score and Plan: TIVA, Midazolam and Treatment may vary due to age or medical condition  Airway Management Planned: Natural Airway and Nasal Cannula  Additional Equipment:   Intra-op Plan:   Post-operative Plan:   Informed Consent: I have reviewed the patients History and Physical, chart, labs and discussed the procedure including the risks, benefits and alternatives for the proposed anesthesia with the patient or authorized representative who has indicated his/her understanding and acceptance.       Plan Discussed with: CRNA  Anesthesia Plan Comments:         Anesthesia Quick Evaluation  

## 2019-08-31 NOTE — H&P (Signed)

## 2019-09-01 ENCOUNTER — Encounter: Payer: Self-pay | Admitting: Ophthalmology

## 2019-09-01 DIAGNOSIS — H2512 Age-related nuclear cataract, left eye: Secondary | ICD-10-CM | POA: Diagnosis not present

## 2019-09-06 ENCOUNTER — Other Ambulatory Visit: Payer: Self-pay

## 2019-09-06 MED ORDER — METOPROLOL TARTRATE 25 MG PO TABS: 25.0000 mg | ORAL_TABLET | Freq: Two times a day (BID) | ORAL | 0 refills | Status: DC

## 2019-09-06 NOTE — Telephone Encounter (Signed)
*  STAT* If patient is at the pharmacy, call can be transferred to refill team.   1. Which medications need to be refilled? (please list name of each medication and dose if known) Metoprolol  2. Which pharmacy/location (including street and city if local pharmacy) is medication to be sent to? [facility]  3. Do they need a 30 day or 90 day supply? [place]

## 2019-09-15 ENCOUNTER — Other Ambulatory Visit: Payer: Self-pay

## 2019-09-15 ENCOUNTER — Encounter: Payer: Self-pay | Admitting: Ophthalmology

## 2019-09-22 ENCOUNTER — Other Ambulatory Visit: Payer: Self-pay

## 2019-09-22 ENCOUNTER — Other Ambulatory Visit
Admission: RE | Admit: 2019-09-22 | Discharge: 2019-09-22 | Disposition: A | Discharge status: Home / Self Care | Payer: Medicare Other | Source: Ambulatory Visit | Attending: Ophthalmology | Admitting: Ophthalmology

## 2019-09-22 DIAGNOSIS — Z20822 Contact with and (suspected) exposure to covid-19: Secondary | ICD-10-CM | POA: Insufficient documentation

## 2019-09-22 DIAGNOSIS — Z01812 Encounter for preprocedural laboratory examination: Secondary | ICD-10-CM | POA: Diagnosis not present

## 2019-09-22 LAB — SARS CORONAVIRUS 2 (TAT 6-24 HRS): SARS Coronavirus 2: NEGATIVE

## 2019-09-22 NOTE — Discharge Instructions (Signed)

## 2019-09-26 ENCOUNTER — Ambulatory Visit: Payer: Medicare Other | Admitting: Anesthesiology

## 2019-09-26 ENCOUNTER — Other Ambulatory Visit: Payer: Self-pay

## 2019-09-26 ENCOUNTER — Encounter: Payer: Self-pay | Admitting: Ophthalmology

## 2019-09-26 ENCOUNTER — Encounter
Admission: RE | Disposition: A | Discharge status: Home / Self Care | Payer: Self-pay | Source: Home / Self Care | Attending: Ophthalmology

## 2019-09-26 ENCOUNTER — Ambulatory Visit
Admission: RE | Admit: 2019-09-26 | Discharge: 2019-09-26 | Disposition: A | Discharge status: Home / Self Care | Payer: Medicare Other | Attending: Ophthalmology | Admitting: Ophthalmology

## 2019-09-26 DIAGNOSIS — K219 Gastro-esophageal reflux disease without esophagitis: Secondary | ICD-10-CM | POA: Diagnosis not present

## 2019-09-26 DIAGNOSIS — H2512 Age-related nuclear cataract, left eye: Secondary | ICD-10-CM | POA: Diagnosis not present

## 2019-09-26 DIAGNOSIS — H25812 Combined forms of age-related cataract, left eye: Secondary | ICD-10-CM | POA: Diagnosis not present

## 2019-09-26 DIAGNOSIS — M1990 Unspecified osteoarthritis, unspecified site: Secondary | ICD-10-CM | POA: Insufficient documentation

## 2019-09-26 DIAGNOSIS — I2510 Atherosclerotic heart disease of native coronary artery without angina pectoris: Secondary | ICD-10-CM | POA: Insufficient documentation

## 2019-09-26 DIAGNOSIS — E559 Vitamin D deficiency, unspecified: Secondary | ICD-10-CM | POA: Diagnosis not present

## 2019-09-26 DIAGNOSIS — E039 Hypothyroidism, unspecified: Secondary | ICD-10-CM | POA: Insufficient documentation

## 2019-09-26 DIAGNOSIS — E785 Hyperlipidemia, unspecified: Secondary | ICD-10-CM | POA: Diagnosis not present

## 2019-09-26 DIAGNOSIS — E7800 Pure hypercholesterolemia, unspecified: Secondary | ICD-10-CM | POA: Insufficient documentation

## 2019-09-26 DIAGNOSIS — Z7982 Long term (current) use of aspirin: Secondary | ICD-10-CM | POA: Insufficient documentation

## 2019-09-26 DIAGNOSIS — I10 Essential (primary) hypertension: Secondary | ICD-10-CM | POA: Diagnosis not present

## 2019-09-26 DIAGNOSIS — G4733 Obstructive sleep apnea (adult) (pediatric): Secondary | ICD-10-CM | POA: Insufficient documentation

## 2019-09-26 DIAGNOSIS — Z7902 Long term (current) use of antithrombotics/antiplatelets: Secondary | ICD-10-CM | POA: Diagnosis not present

## 2019-09-26 DIAGNOSIS — F419 Anxiety disorder, unspecified: Secondary | ICD-10-CM | POA: Diagnosis not present

## 2019-09-26 DIAGNOSIS — Z79899 Other long term (current) drug therapy: Secondary | ICD-10-CM | POA: Diagnosis not present

## 2019-09-26 DIAGNOSIS — Z6841 Body Mass Index (BMI) 40.0 and over, adult: Secondary | ICD-10-CM | POA: Insufficient documentation

## 2019-09-26 HISTORY — PX: CATARACT EXTRACTION W/PHACO: SHX586

## 2019-09-26 SURGERY — PHACOEMULSIFICATION, CATARACT, WITH IOL INSERTION
Anesthesia: Monitor Anesthesia Care | Site: Eye | Laterality: Left

## 2019-09-26 MED ORDER — EPINEPHRINE PF 1 MG/ML IJ SOLN
INTRAOCULAR | Status: DC | PRN
  Administered 2019-09-26: 46 mL via OPHTHALMIC

## 2019-09-26 MED ORDER — FENTANYL CITRATE (PF) 100 MCG/2ML IJ SOLN
INTRAMUSCULAR | Status: DC | PRN
Start: 2019-09-26 — End: 2019-09-26
  Administered 2019-09-26: 100 ug via INTRAVENOUS

## 2019-09-26 MED ORDER — LACTATED RINGERS IV SOLN: INTRAVENOUS | Status: DC

## 2019-09-26 MED ORDER — MOXIFLOXACIN HCL 0.5 % OP SOLN
OPHTHALMIC | Status: DC | PRN
  Administered 2019-09-26: 0.2 mL via OPHTHALMIC

## 2019-09-26 MED ORDER — TETRACAINE HCL 0.5 % OP SOLN
1.0000 [drp] | OPHTHALMIC | Status: DC | PRN
  Administered 2019-09-26 (×3): 1 [drp] via OPHTHALMIC

## 2019-09-26 MED ORDER — SODIUM HYALURONATE 23 MG/ML IO SOLN
INTRAOCULAR | Status: DC | PRN
  Administered 2019-09-26: 0.6 mL via INTRAOCULAR

## 2019-09-26 MED ORDER — SODIUM HYALURONATE 10 MG/ML IO SOLN
INTRAOCULAR | Status: DC | PRN
  Administered 2019-09-26: 0.55 mL via INTRAOCULAR

## 2019-09-26 MED ORDER — LIDOCAINE HCL (PF) 2 % IJ SOLN
INTRAOCULAR | Status: DC | PRN
  Administered 2019-09-26: 2 mL via INTRAOCULAR

## 2019-09-26 MED ORDER — FENTANYL CITRATE (PF) 100 MCG/2ML IJ SOLN: 25.0000 ug | INTRAMUSCULAR | Status: DC | PRN

## 2019-09-26 MED ORDER — ARMC OPHTHALMIC DILATING DROPS
1.0000 "application " | OPHTHALMIC | Status: DC | PRN
  Administered 2019-09-26 (×3): 1 via OPHTHALMIC

## 2019-09-26 MED ORDER — MIDAZOLAM HCL 2 MG/2ML IJ SOLN
INTRAMUSCULAR | Status: DC | PRN
  Administered 2019-09-26: 2 mg via INTRAVENOUS

## 2019-09-26 SURGICAL SUPPLY — 20 items
CANNULA ANT/CHMB 27G (MISCELLANEOUS) ×2 IMPLANT
CANNULA ANT/CHMB 27GA (MISCELLANEOUS) ×4 IMPLANT
DISSECTOR HYDRO NUCLEUS 50X22 (MISCELLANEOUS) ×2 IMPLANT
GLOVE SURG LX 7.5 STRW (GLOVE) ×1
GLOVE SURG LX STRL 7.5 STRW (GLOVE) ×1 IMPLANT
GLOVE SURG SYN 8.5  E (GLOVE) ×2
GLOVE SURG SYN 8.5 E (GLOVE) ×1 IMPLANT
GLOVE SURG SYN 8.5 PF PI (GLOVE) ×1 IMPLANT
GOWN STRL REUS W/ TWL LRG LVL3 (GOWN DISPOSABLE) ×2 IMPLANT
GOWN STRL REUS W/TWL LRG LVL3 (GOWN DISPOSABLE) ×4
LENS IOL DIOP 17.5 (Intraocular Lens) ×2 IMPLANT
LENS IOL TECNIS MONO 17.5 (Intraocular Lens) IMPLANT
MARKER SKIN DUAL TIP RULER LAB (MISCELLANEOUS) ×2 IMPLANT
PACK DR. KING ARMS (PACKS) ×2 IMPLANT
PACK EYE AFTER SURG (MISCELLANEOUS) ×2 IMPLANT
PACK OPTHALMIC (MISCELLANEOUS) ×2 IMPLANT
SYR 3ML LL SCALE MARK (SYRINGE) ×2 IMPLANT
SYR TB 1ML LUER SLIP (SYRINGE) ×2 IMPLANT
WATER STERILE IRR 250ML POUR (IV SOLUTION) ×2 IMPLANT
WIPE NON LINTING 3.25X3.25 (MISCELLANEOUS) ×2 IMPLANT

## 2019-09-26 NOTE — Op Note (Signed)
OPERATIVE NOTE  Olivia Dougherty [id number] 09/26/2019   PREOPERATIVE DIAGNOSIS:  Nuclear sclerotic cataract left eye.  H25.12   POSTOPERATIVE DIAGNOSIS:    Nuclear sclerotic cataract left eye.     PROCEDURE:  Phacoemusification with posterior chamber intraocular lens placement of the left eye   LENS:   Implant Name Type Inv. Item Serial No. Manufacturer Lot No. LRB No. Used Action  LENS IOL DIOP 17.5 - [id number] Intraocular Lens LENS IOL DIOP 17.5 [id number] [employer]  Left 1 Implanted      Procedure(s): CATARACT EXTRACTION PHACO AND INTRAOCULAR LENS PLACEMENT (IOC) LEFT 1.60 00:20.3 (Left)  DCB00 +17.5   ULTRASOUND TIME: 0 minutes 20 seconds.  CDE 1.60   SURGEON:  Benay Pillow, MD, MPH   ANESTHESIA:  Topical with tetracaine drops augmented with 1% preservative-free intracameral lidocaine.  ESTIMATED BLOOD LOSS: <1 mL   COMPLICATIONS:  None.   DESCRIPTION OF PROCEDURE:  The patient was identified in the holding room and transported to the operating room and placed in the supine position under the operating microscope.  The left eye was identified as the operative eye and it was prepped and draped in the usual sterile ophthalmic fashion.   A 1.0 millimeter clear-corneal paracentesis was made at the 5:00 position. 0.5 ml of preservative-free 1% lidocaine with epinephrine was injected into the anterior chamber.  The anterior chamber was filled with Healon 5 viscoelastic.  A 2.4 millimeter keratome was used to make a near-clear corneal incision at the 2:00 position.  A curvilinear capsulorrhexis was made with a cystotome and capsulorrhexis forceps.  Balanced salt solution was used to hydrodissect and hydrodelineate the nucleus.   Phacoemulsification was then used in stop and chop fashion to remove the lens nucleus and epinucleus.  The remaining cortex was then removed using the irrigation and aspiration handpiece. Healon was then placed into the capsular bag to  distend it for lens placement.  A lens was then injected into the capsular bag.  The remaining viscoelastic was aspirated.   Wounds were hydrated with balanced salt solution.  The anterior chamber was inflated to a physiologic pressure with balanced salt solution.  Intracameral vigamox 0.1 mL undiltued was injected into the eye and a drop placed onto the ocular surface.  No wound leaks were noted.  The patient was taken to the recovery room in stable condition without complications of anesthesia or surgery  Benay Pillow 09/26/2019, 8:53 AM

## 2019-09-26 NOTE — Anesthesia Postprocedure Evaluation (Signed)
Anesthesia Post Note  Patient: Roni Friberg Caldas  Procedure(s) Performed: CATARACT EXTRACTION PHACO AND INTRAOCULAR LENS PLACEMENT (IOC) LEFT 1.60 00:20.3 (Left Eye)     Patient location during evaluation: PACU Anesthesia Type: MAC Level of consciousness: awake and alert Pain management: pain level controlled Vital Signs Assessment: post-procedure vital signs reviewed and stable Respiratory status: spontaneous breathing, nonlabored ventilation, respiratory function stable and patient connected to nasal cannula oxygen Cardiovascular status: stable and blood pressure returned to baseline Postop Assessment: no apparent nausea or vomiting Anesthetic complications: no   No complications documented.  Karlo Goeden, [place]

## 2019-09-26 NOTE — Transfer of Care (Signed)
Immediate Anesthesia Transfer of Care Note  Patient: Olivia Dougherty  Procedure(s) Performed: CATARACT EXTRACTION PHACO AND INTRAOCULAR LENS PLACEMENT (IOC) LEFT 1.60 00:20.3 (Left Eye)  Patient Location: PACU  Anesthesia Type: MAC  Level of Consciousness: awake, alert  and patient cooperative  Airway and Oxygen Therapy: Patient Spontanous Breathing and Patient connected to supplemental oxygen  Post-op Assessment: Post-op Vital signs reviewed, Patient's Cardiovascular Status Stable, Respiratory Function Stable, Patent Airway and No signs of Nausea or vomiting  Post-op Vital Signs: Reviewed and stable  Complications: No complications documented.

## 2019-09-26 NOTE — Anesthesia Preprocedure Evaluation (Signed)
Anesthesia Evaluation  Patient identified by MRN, date of birth, ID band Patient awake    Reviewed: Allergy & Precautions, NPO status   History of Anesthesia Complications (+) PONV  Airway Mallampati: II  TM Distance: >3 FB     Dental   Pulmonary sleep apnea , Recent URI: no cpap.,    breath sounds clear to auscultation       Cardiovascular hypertension,  Rhythm:Regular Rate:Normal  HLD   Neuro/Psych Anxiety    GI/Hepatic GERD  ,  Endo/Other  Hypothyroidism Morbid obesity (BMI 41)  Renal/GU      Musculoskeletal  (+) Arthritis ,   Abdominal   Peds  Hematology   Anesthesia Other Findings   Reproductive/Obstetrics                             Anesthesia Physical  Anesthesia Plan  ASA: III  Anesthesia Plan: MAC   Post-op Pain Management:    Induction: Intravenous  PONV Risk Score and Plan: TIVA, Midazolam and Treatment may vary due to age or medical condition  Airway Management Planned: Natural Airway and Nasal Cannula  Additional Equipment:   Intra-op Plan:   Post-operative Plan:   Informed Consent: I have reviewed the patients History and Physical, chart, labs and discussed the procedure including the risks, benefits and alternatives for the proposed anesthesia with the patient or authorized representative who has indicated his/her understanding and acceptance.       Plan Discussed with: CRNA  Anesthesia Plan Comments:         Anesthesia Quick Evaluation

## 2019-09-26 NOTE — Anesthesia Procedure Notes (Signed)
Procedure Name: MAC Date/Time: 09/26/2019 8:36 AM Performed by: Silvana Newness, CRNA Pre-anesthesia Checklist: Patient identified, Emergency Drugs available, Suction available, Patient being monitored and Timeout performed Patient Re-evaluated:Patient Re-evaluated prior to induction Oxygen Delivery Method: Nasal cannula Placement Confirmation: positive ETCO2

## 2019-09-26 NOTE — H&P (Signed)

## 2019-09-27 ENCOUNTER — Encounter: Payer: Self-pay | Admitting: Ophthalmology

## 2019-10-13 ENCOUNTER — Ambulatory Visit (INDEPENDENT_AMBULATORY_CARE_PROVIDER_SITE_OTHER): Payer: Medicare Other | Admitting: Cardiovascular Disease

## 2019-10-13 ENCOUNTER — Other Ambulatory Visit: Payer: Self-pay

## 2019-10-13 ENCOUNTER — Encounter: Payer: Self-pay | Admitting: Cardiovascular Disease

## 2019-10-13 VITALS — BP 138/80 | HR 65 | Ht 62.0 in | Wt 224.5 lb

## 2019-10-13 DIAGNOSIS — I2510 Atherosclerotic heart disease of native coronary artery without angina pectoris: Secondary | ICD-10-CM

## 2019-10-13 DIAGNOSIS — I10 Essential (primary) hypertension: Secondary | ICD-10-CM | POA: Diagnosis not present

## 2019-10-13 DIAGNOSIS — E7800 Pure hypercholesterolemia, unspecified: Secondary | ICD-10-CM

## 2019-10-13 MED ORDER — EZETIMIBE 10 MG PO TABS: 10.0000 mg | ORAL_TABLET | Freq: Every day | ORAL | 3 refills | Status: DC

## 2019-10-13 MED ORDER — LOSARTAN POTASSIUM 50 MG PO TABS
100.0000 mg | ORAL_TABLET | Freq: Every day | ORAL | 3 refills | Status: DC
Start: 2019-10-13 — End: 2020-10-12

## 2019-10-13 MED ORDER — ROSUVASTATIN CALCIUM 10 MG PO TABS
10.0000 mg | ORAL_TABLET | Freq: Every day | ORAL | 3 refills | Status: DC
Start: 2019-10-13 — End: 2020-10-12

## 2019-10-13 MED ORDER — CLOPIDOGREL BISULFATE 75 MG PO TABS
75.0000 mg | ORAL_TABLET | Freq: Every day | ORAL | 3 refills | Status: DC
Start: 2019-10-13 — End: 2020-10-12

## 2019-10-13 MED ORDER — METOPROLOL TARTRATE 25 MG PO TABS
25.0000 mg | ORAL_TABLET | Freq: Two times a day (BID) | ORAL | 3 refills | Status: DC
Start: 2019-10-13 — End: 2020-10-12

## 2019-10-13 NOTE — Progress Notes (Signed)
Cardiology Office Note   Date:  10/13/2019   ID:  Olivia Dougherty, DOB March 28, 1950, MRN [id number]  PCP:  Virginia Crews, MD  Cardiologist:   Kathlyn Sacramento, MD   Chief Complaint  Patient presents with   office visit    12 month F/U; Meds verbally reviewed with patient.      History of Present Illness: Olivia Dougherty is a 69 y.o. female who is here today for follow-up visit regarding coronary artery disease.  She underwent LAD PCI and Cypher drug-eluting stent placement with balloon angioplasty of diagonal in 2005 by Dr. Rollene Fare.  No cardiac events since then.  Cardiac catheterization in 2010 showed patent stent with minimal restenosis. She has known history of hyperlipidemia with intolerance to most statins due to myalgia.  She had CTA of the coronary arteries in 2017 which showed minimal plaque with no obstructive disease and patent LAD stent. She has been doing well with no recent chest pain, shortness of breath or palpitations.  She takes her medications regularly.  She has not been able to lose any weight and she is frustrated with that.   Past Medical History:  Diagnosis Date   Anxiety    Arthritis    lower back   Basal cell carcinoma 11/2011   face, left leg-using chemo cream on temple area   Coronary artery disease 2005   LAD PCI with Cypher drug-eluting stent placement 3.0 x 13 mm with balloon angioplasty of diagonal.  Cardiac catheterization in 2010 showed patent stent with minimal restenosis.   GERD (gastroesophageal reflux disease)    Heart disease    Heart problem    98% blockage   Hypercholesteremia    Hypertension    Hypothyroidism    Kidney stone    Osteoarthritis    PONV (postoperative nausea and vomiting)    after 1 procedure approx 10 yrs ago   Sleep apnea    mild, could not tol cpap   Squamous cell carcinoma of skin    leg    Past Surgical History:  Procedure Laterality Date   ANGIOPLASTY  2005   CAD    98% blockage   CARDIAC CATHETERIZATION  02/2008   CATARACT EXTRACTION Right    CATARACT EXTRACTION W/PHACO Right 08/31/2019   Procedure: CATARACT EXTRACTION PHACO AND INTRAOCULAR LENS PLACEMENT ([place]) RIGHT;  Surgeon: Eulogio Bear, MD;  Location: [place];  Service: Ophthalmology;  Laterality: Right;  4.41 0:30.7   CATARACT EXTRACTION W/PHACO Left 09/26/2019   Procedure: CATARACT EXTRACTION PHACO AND INTRAOCULAR LENS PLACEMENT ([facility]) LEFT 1.60 00:20.3;  Surgeon: Eulogio Bear, MD;  Location: [place];  Service: Ophthalmology;  Laterality: Left;   COLONOSCOPY WITH PROPOFOL N/A 12/29/2014   Procedure: COLONOSCOPY WITH PROPOFOL;  Surgeon: Lucilla Lame, MD;  Location: [place];  Service: Endoscopy;  Laterality: N/A;   COLONOSCOPY WITH PROPOFOL N/A 12/31/2017   Procedure: COLONOSCOPY WITH PROPOFOL;  Surgeon: Lucilla Lame, MD;  Location: [place];  Service: Endoscopy;  Laterality: N/A;  sleep apnea   CORONARY ANGIOPLASTY  2005   LAD   EYE SURGERY Right    HYSTEROSCOPY  11/2001   resection of benign leiomyoma   KNEE ARTHROSCOPY Right 08/16/2014   Procedure: ARTHROSCOPY KNEE, partial lateral menisectomy and chondroplasty;  Surgeon: Dereck Leep, MD;  Location: [facility];  Service: Orthopedics;  Laterality: Right;   POLYPECTOMY  12/29/2014   Procedure: POLYPECTOMY;  Surgeon: Lucilla Lame, MD;  Location: [place];  Service: Endoscopy;;   SKIN CANCER EXCISION Right 02/2018   Right leg - Squamous    THYROID CYST EXCISION Left 10/2002   left lobe thyroid excision   THYROIDECTOMY, PARTIAL Left    TUBAL LIGATION  3762   BTL   UMBILICAL HERNIA REPAIR  1980   vulvar cancer  1993   laser     Current Outpatient Medications  Medication Sig Dispense Refill   aspirin 81 MG tablet Take 81 mg by mouth daily.     clopidogrel (PLAVIX) 75 MG tablet TAKE ONE TABLET BY MOUTH DAILY WITH BREAKFAST 90 tablet 3   cyclobenzaprine  (FLEXERIL) 5 MG tablet TAKE ONE TABLET BY MOUTH THREE TIMES A DAY AS NEEDED FOR MUSCLE SPASMS 30 tablet 0   diclofenac Sodium (VOLTAREN) 1 % GEL Apply topically as needed.     levothyroxine (SYNTHROID) 75 MCG tablet Take 1 tablet (75 mcg total) by mouth daily before breakfast. 90 tablet 1   losartan (COZAAR) 50 MG tablet TAKE TWO TABLETS BY MOUTH DAILY 180 tablet 3   metoprolol tartrate (LOPRESSOR) 25 MG tablet Take 1 tablet (25 mg total) by mouth 2 (two) times daily. 180 tablet 0   Multiple Vitamin (MULTIVITAMIN) capsule Take 1 capsule by mouth daily.     Multiple Vitamins-Minerals (MULTIVITAMIN PO) Take by mouth daily.     rosuvastatin (CRESTOR) 10 MG tablet TAKE ONE TABLET BY MOUTH DAILY 90 tablet 2   Vitamin D, Ergocalciferol, (DRISDOL) 1.25 MG (50000 UT) CAPS capsule TAKE 1 CAPSULE BY MOUTH EVERY 14 DAYS 6 capsule 3   doxycycline (VIBRA-TABS) 100 MG tablet Take 1 tablet (100 mg total) by mouth 2 (two) times daily. (Patient not taking: Reported on 09/26/2019) 20 tablet 0   No current facility-administered medications for this visit.    Allergies:   Atorvastatin, Codeine, Ace inhibitors, Enalapril, Penicillins, Sulfa antibiotics, and Adhesive [tape]    Social History:  The patient  reports that she has never smoked. She has never used smokeless tobacco. She reports current alcohol use of about 2.0 standard drinks of alcohol per week. She reports that she does not use drugs.   Family History:  The patient's family history includes Cervical cancer in her sister; Diabetes in her father; Heart disease in her father; Hypertension in her father and mother; Stroke in her sister.    ROS:  Please see the history of present illness.   Otherwise, review of systems are positive for none.   All other systems are reviewed and negative.    PHYSICAL EXAM: VS:  BP 138/80 (BP Location: Left Arm, Patient Position: Sitting, Cuff Size: Normal)    Pulse 65    Ht 5\' 2"  (1.575 m)    Wt 224 lb 8 oz  (101.8 kg)    LMP 01/28/2007 (Approximate)    SpO2 99%    BMI 41.06 kg/m  , BMI Body mass index is 41.06 kg/m. GEN: Well nourished, well developed, in no acute distress  HEENT: normal  Neck: no JVD, carotid bruits, or masses Cardiac: RRR; no  rubs, or gallops,no edema .  1 / 6 systolic ejection murmur in the aortic area Respiratory:  clear to auscultation bilaterally, normal work of breathing GI: soft, nontender, nondistended, + BS MS: no deformity or atrophy  Skin: warm and dry, no rash Neuro:  Strength and sensation are intact Psych: euthymic mood, full affect   EKG:  EKG is ordered today. The ekg ordered today demonstrates normal sinus rhythm with no significant ST or  T wave changes.   Recent Labs: 12/13/2018: ALT 25; Hemoglobin 13.7; Platelets 198 06/09/2019: BUN 13; Creatinine, Ser 0.75; Potassium 4.3; Sodium 141; TSH 1.460    Lipid Panel    Component Value Date/Time   CHOL 165 12/13/2018 1121   TRIG 97 12/13/2018 1121   HDL 63 12/13/2018 1121   CHOLHDL 2.8 05/21/2017 0957   VLDL 22 05/21/2017 0957   LDLCALC 84 12/13/2018 1121      Wt Readings from Last 3 Encounters:  10/13/19 224 lb 8 oz (101.8 kg)  09/26/19 224 lb 9.6 oz (101.9 kg)  08/31/19 222 lb (100.7 kg)       No flowsheet data found.    ASSESSMENT AND PLAN:  1.  Coronary artery disease involving native coronary arteries without angina: Given that she had first generation drug-eluting stent to the LAD, I favor continued lifelong dual antiplatelet therapy as tolerated.  She has no anginal symptoms at the present time.  2.  Hyperlipidemia with intolerance to statins.  She is tolerating rosuvastatin without significant myalgia.  Most recent LDL was 84.  We need to get her LDL below 70 and thus I added Zetia 10 mg daily.    3.  Essential hypertension: Blood pressure was initially elevated but repeat by me was 140/78.  Continue same medications for now   Disposition:   FU with me in 12  months  Signed,  Kathlyn Sacramento, MD  10/13/2019 4:16 PM    [place]

## 2019-10-13 NOTE — Patient Instructions (Signed)
Medication Instructions:  Your physician has recommended you make the following change in your medication:   START Zetia 10 mg daily. An Rx has been sent to your pharmacy.  Your cardiac medications have been refilled today.  *If you need a refill on your cardiac medications before your next appointment, please call your pharmacy*   Lab Work: None ordered If you have labs (blood work) drawn today and your tests are completely normal, you will receive your results only by:  [place] (if you have MyChart) OR  A paper copy in the mail If you have any lab test that is abnormal or we need to change your treatment, we will call you to review the results.   Testing/Procedures: None ordered   Follow-Up: At [facility], you and your health needs are our priority.  As part of our continuing mission to provide you with exceptional heart care, we have created designated Provider Care Teams.  These Care Teams include your primary Cardiologist (physician) and Advanced Practice Providers (APPs -  Physician Assistants and Nurse Practitioners) who all work together to provide you with the care you need, when you need it.  We recommend signing up for the patient portal called "MyChart".  Sign up information is provided on this After Visit Summary.  MyChart is used to connect with patients for Virtual Visits (Telemedicine).  Patients are able to view lab/test results, encounter notes, upcoming appointments, etc.  Non-urgent messages can be sent to your provider as well.   To learn more about what you can do with MyChart, go to [email or web address].    Your next appointment:   12 month(s)  The format for your next appointment:   In Person  Provider:    You may see Dr. Fletcher Anon or one of the following Advanced Practice Providers on your designated Care Team:    Murray Hodgkins, NP  Christell Faith, PA-C  Marrianne Mood, PA-C    Other Instructions N/A

## 2019-11-02 ENCOUNTER — Other Ambulatory Visit: Payer: Self-pay

## 2019-11-02 ENCOUNTER — Ambulatory Visit (INDEPENDENT_AMBULATORY_CARE_PROVIDER_SITE_OTHER): Payer: Medicare Other

## 2019-11-02 DIAGNOSIS — Z23 Encounter for immunization: Secondary | ICD-10-CM | POA: Diagnosis not present

## 2019-11-03 ENCOUNTER — Ambulatory Visit: Payer: Medicare Other | Attending: Internal Medicine

## 2019-11-03 DIAGNOSIS — Z23 Encounter for immunization: Secondary | ICD-10-CM

## 2019-11-03 NOTE — Progress Notes (Signed)
   Covid-19 Vaccination Clinic  Name:  Cherlynn Popiel Kielbasa    MRN: [id number] DOB: 1950/06/30  11/03/2019  Ms. Haggart was observed post Covid-19 immunization for 15 minutes without incident. She was provided with Vaccine Information Sheet and instruction to access the V-Safe system.   Ms. Cammack was instructed to call 911 with any severe reactions post vaccine: Marland Kitchen Difficulty breathing  . Swelling of face and throat  . A fast heartbeat  . A bad rash all over body  . Dizziness and weakness

## 2019-11-16 ENCOUNTER — Other Ambulatory Visit: Payer: Self-pay | Admitting: Obstetrics & Gynecology

## 2019-11-16 ENCOUNTER — Telehealth: Payer: Self-pay

## 2019-11-16 ENCOUNTER — Other Ambulatory Visit (HOSPITAL_COMMUNITY): Payer: Self-pay | Admitting: Obstetrics & Gynecology

## 2019-11-16 DIAGNOSIS — Z1231 Encounter for screening mammogram for malignant neoplasm of breast: Secondary | ICD-10-CM

## 2019-11-16 NOTE — Telephone Encounter (Signed)
Spoke with pt. Pt states wanting to know if needs order for TBC to have screening MMG. Pt advised she does not need order and denies having any problems at this time. Pt advised to call TBC to schedule. Pt agreeable and thankful for call.  Encounter closed

## 2019-11-16 NOTE — Telephone Encounter (Signed)
Patient is calling in regards to wanting an order for a mammogram. Patient would like to call back to see if she can go ahead to schedule.

## 2019-11-25 DIAGNOSIS — H43822 Vitreomacular adhesion, left eye: Secondary | ICD-10-CM | POA: Diagnosis not present

## 2019-11-25 DIAGNOSIS — H43812 Vitreous degeneration, left eye: Secondary | ICD-10-CM | POA: Diagnosis not present

## 2019-11-25 DIAGNOSIS — H35373 Puckering of macula, bilateral: Secondary | ICD-10-CM | POA: Diagnosis not present

## 2019-11-25 DIAGNOSIS — H43392 Other vitreous opacities, left eye: Secondary | ICD-10-CM | POA: Diagnosis not present

## 2019-12-04 ENCOUNTER — Other Ambulatory Visit: Payer: Self-pay | Admitting: Obstetrics & Gynecology

## 2019-12-04 DIAGNOSIS — E559 Vitamin D deficiency, unspecified: Secondary | ICD-10-CM

## 2019-12-05 NOTE — Telephone Encounter (Signed)
Medication refill request: Vitamin D Last AEX:  11/01/18 Dr. Sabra Heck Next AEX: none Last MMG (if hormonal medication request): n/a Refill authorized: Today, please advise

## 2019-12-06 ENCOUNTER — Other Ambulatory Visit: Payer: Self-pay

## 2019-12-06 NOTE — Telephone Encounter (Signed)
Returned call to patient regarding refusal of Vitamin D Rx. Advised patient Dr.Miller is no longer with our practice and patient hasn't had AEX in a year. Advised she had her Vitamin D checked in May with her PCP and it looks like she has been following. She will call PCP for refill. Dr.Miller's new phone number given to patient.

## 2019-12-06 NOTE — Telephone Encounter (Signed)
Patient says Vitamin D refill request was denied.

## 2019-12-08 NOTE — Progress Notes (Signed)
Subjective:   Olivia Dougherty is a 69 y.o. female who presents for Medicare Annual (Subsequent) preventive examination.  Review of Systems    N/A  Cardiac Risk Factors include: advanced age (>55men, >58 women);dyslipidemia;hypertension;obesity (BMI >30kg/m2)     Objective:    Today's Vitals   12/12/19 1005  BP: (!) 140/58  Pulse: 71  Temp: 98.9 F (37.2 C)  TempSrc: Oral  SpO2: 96%  Weight: 230 lb 3.2 oz (104.4 kg)  Height: 5\' 2"  (1.575 m)  PainSc: 0-No pain   Body mass index is 42.1 kg/m.  Advanced Directives 12/12/2019 09/26/2019 08/31/2019 12/07/2018 12/31/2017 12/29/2014 08/10/2014  Does Patient Have a Medical Advance Directive? Yes Yes No Yes No No Yes  Type of Paramedic of [place];Living will [place];Living will - [place];Living will - - -  Does patient want to make changes to medical advance directive? - - - - - - No - Patient declined  Copy of [place] in Chart? No - copy requested No - copy requested - No - copy requested - - -  Would patient like information on creating a medical advance directive? - - No - Patient declined - No - Patient declined No - patient declined information -    Current Medications (verified) Outpatient Encounter Medications as of 12/12/2019  Medication Sig  . aspirin 81 MG tablet Take 81 mg by mouth daily.  Marland Kitchen augmented betamethasone dipropionate (DIPROLENE-AF) 0.05 % cream Apply 1 application topically as needed. For red bumps  . clopidogrel (PLAVIX) 75 MG tablet Take 1 tablet (75 mg total) by mouth daily with breakfast.  . cyclobenzaprine (FLEXERIL) 5 MG tablet TAKE ONE TABLET BY MOUTH THREE TIMES A DAY AS NEEDED FOR MUSCLE SPASMS  . diclofenac Sodium (VOLTAREN) 1 % GEL Apply topically as needed.  . ezetimibe (ZETIA) 10 MG tablet Take 1 tablet (10 mg total) by mouth daily.  Marland Kitchen levothyroxine (SYNTHROID) 75 MCG tablet Take 1 tablet (75 mcg total) by  mouth daily before breakfast.  . losartan (COZAAR) 50 MG tablet Take 2 tablets (100 mg total) by mouth daily.  . metoprolol tartrate (LOPRESSOR) 25 MG tablet Take 1 tablet (25 mg total) by mouth 2 (two) times daily.  . Multiple Vitamin (MULTIVITAMIN) capsule Take 1 capsule by mouth daily.  . rosuvastatin (CRESTOR) 10 MG tablet Take 1 tablet (10 mg total) by mouth daily.  . traZODone (DESYREL) 50 MG tablet Take 25-50 mg by mouth at bedtime as needed for sleep.  . Vitamin D, Ergocalciferol, (DRISDOL) 1.25 MG (50000 UT) CAPS capsule TAKE 1 CAPSULE BY MOUTH EVERY 14 DAYS  . doxycycline (VIBRA-TABS) 100 MG tablet Take 1 tablet (100 mg total) by mouth 2 (two) times daily. (Patient not taking: Reported on 09/26/2019)  . Multiple Vitamins-Minerals (MULTIVITAMIN PO) Take by mouth daily. (Patient not taking: Reported on 12/12/2019)   No facility-administered encounter medications on file as of 12/12/2019.    Allergies (verified) Atorvastatin, Codeine, Ace inhibitors, Enalapril, Penicillins, Sulfa antibiotics, and Adhesive [tape]   History: Past Medical History:  Diagnosis Date  . Anxiety   . Arthritis    lower back  . Basal cell carcinoma 11/2011   face, left leg-using chemo cream on temple area  . Coronary artery disease 2005   LAD PCI with Cypher drug-eluting stent placement 3.0 x 13 mm with balloon angioplasty of diagonal.  Cardiac catheterization in 2010 showed patent stent with minimal restenosis.  Marland Kitchen GERD (gastroesophageal reflux  disease)   . Heart disease   . Heart problem    98% blockage  . Hypercholesteremia   . Hypertension   . Hypothyroidism   . Kidney stone   . Osteoarthritis   . PONV (postoperative nausea and vomiting)    after 1 procedure approx 10 yrs ago  . Sleep apnea    mild, could not tol cpap  . Squamous cell carcinoma of skin    leg   Past Surgical History:  Procedure Laterality Date  . ANGIOPLASTY  2005   CAD   98% blockage  . CARDIAC CATHETERIZATION  02/2008    . CATARACT EXTRACTION Right   . CATARACT EXTRACTION W/PHACO Right 08/31/2019   Procedure: CATARACT EXTRACTION PHACO AND INTRAOCULAR LENS PLACEMENT ([place]) RIGHT;  Surgeon: Eulogio Bear, MD;  Location: [facility];  Service: Ophthalmology;  Laterality: Right;  4.41 0:30.7  . CATARACT EXTRACTION W/PHACO Left 09/26/2019   Procedure: CATARACT EXTRACTION PHACO AND INTRAOCULAR LENS PLACEMENT ([facility]) LEFT 1.60 00:20.3;  Surgeon: Eulogio Bear, MD;  Location: [place] [facility];  Service: Ophthalmology;  Laterality: Left;  . COLONOSCOPY WITH PROPOFOL N/A 12/29/2014   Procedure: COLONOSCOPY WITH PROPOFOL;  Surgeon: Lucilla Lame, MD;  Location: [facility];  Service: Endoscopy;  Laterality: N/A;  . COLONOSCOPY WITH PROPOFOL N/A 12/31/2017   Procedure: COLONOSCOPY WITH PROPOFOL;  Surgeon: Lucilla Lame, MD;  Location: [facility];  Service: Endoscopy;  Laterality: N/A;  sleep apnea  . CORONARY ANGIOPLASTY  2005   LAD  . EYE SURGERY Right   . HYSTEROSCOPY  11/2001   resection of benign leiomyoma  . KNEE ARTHROSCOPY Right 08/16/2014   Procedure: ARTHROSCOPY KNEE, partial lateral menisectomy and chondroplasty;  Surgeon: Dereck Leep, MD;  Location: [facility];  Service: Orthopedics;  Laterality: Right;  . POLYPECTOMY  12/29/2014   Procedure: POLYPECTOMY;  Surgeon: Lucilla Lame, MD;  Location: [place];  Service: Endoscopy;;  . SKIN CANCER EXCISION Right 02/2018   Right leg - Squamous   . THYROID CYST EXCISION Left 10/2002   left lobe thyroid excision  . THYROIDECTOMY, PARTIAL Left   . TUBAL LIGATION  1980   BTL  . UMBILICAL HERNIA REPAIR  1980  . vulvar cancer  1993   laser   Family History  Problem Relation Age of Onset  . Hypertension Mother   . Hypertension Father   . Diabetes Father   . Heart disease Father   . Cervical cancer Sister   . Stroke Sister   . Colon cancer Neg Hx   . Breast cancer Neg Hx   . Ovarian cancer Neg Hx    Social History    Socioeconomic History  . Marital status: Married    Spouse name: Edd Arbour  . Number of children: 3  . Years of education: 62  . Highest education level: High school graduate  Occupational History  . Occupation: retired  Tobacco Use  . Smoking status: Never Smoker  . Smokeless tobacco: Never Used  Vaping Use  . Vaping Use: Never used  Substance and Sexual Activity  . Alcohol use: Yes    Alcohol/week: 2.0 standard drinks    Types: 2 Glasses of wine per week    Comment: weekends  . Drug use: No  . Sexual activity: Yes    Partners: Male    Birth control/protection: Surgical  Other Topics Concern  . Not on file  Social History Narrative   2 living children; one son deceased from overdose.  Social Determinants of Health   Financial Resource Strain: Low Risk   . Difficulty of Paying Living Expenses: Not hard at all  Food Insecurity: No Food Insecurity  . Worried About Charity fundraiser in the Last Year: Never true  . Ran Out of Food in the Last Year: Never true  Transportation Needs: No Transportation Needs  . Lack of Transportation (Medical): No  . Lack of Transportation (Non-Medical): No  Physical Activity: Inactive  . Days of Exercise per Week: 0 days  . Minutes of Exercise per Session: 0 min  Stress: No Stress Concern Present  . Feeling of Stress : Only a little  Social Connections: Moderately Isolated  . Frequency of Communication with Friends and Family: More than three times a week  . Frequency of Social Gatherings with Friends and Family: Twice a week  . Attends Religious Services: Never  . Active Member of Clubs or Organizations: No  . Attends Archivist Meetings: Never  . Marital Status: Married    Tobacco Counseling Counseling given: Not Answered   Clinical Intake:  Pre-visit preparation completed: Yes  Pain : No/denies pain Pain Score: 0-No pain     Nutritional Status: BMI > 30  Obese Nutritional Risks: None Diabetes: No  How  often do you need to have someone help you when you read instructions, pamphlets, or other written materials from your doctor or pharmacy?: 1 - Never  Diabetic? No  Interpreter Needed?: No  Information entered by :: [facility], LPN   Activities of Daily Living In your present state of health, do you have any difficulty performing the following activities: 12/12/2019 09/26/2019  Hearing? Y N  Comment Has seen Dr Richardson Landry for hearing loss. Unsure which ear. Does not wear hearing aids at this time. -  Vision? N N  Difficulty concentrating or making decisions? N N  Walking or climbing stairs? N N  Dressing or bathing? N N  Doing errands, shopping? N -  Preparing Food and eating ? N -  Using the Toilet? N -  In the past six months, have you accidently leaked urine? N -  Do you have problems with loss of bowel control? N -  Managing your Medications? N -  Managing your Finances? N -  Housekeeping or managing your Housekeeping? N -  Some recent data might be hidden    Patient Care Team: Bacigalupo, Dionne Bucy, MD as PCP - General (Family Medicine) Lucilla Lame, MD as Consulting Physician (Gastroenterology) Oneta Rack, MD (Dermatology) Oneta Rack, MD (Dermatology) Eulogio Bear, MD as Consulting Physician (Ophthalmology) Sherlynn Stalls, MD as Consulting Physician (Ophthalmology) Clyde Canterbury, MD as Referring Physician (Otolaryngology)  Indicate any recent Medical Services you may have received from other than [facility] providers in the past year (date may be approximate).     Assessment:   This is a routine wellness examination for [place].  Hearing/Vision screen No exam data present  Dietary issues and exercise activities discussed: Current Exercise Habits: The patient does not participate in regular exercise at present, Exercise limited by: None identified  Goals    . DIET - DECREASE SODA OR JUICE INTAKE     Recommend to continue cutting back on soda in take to 1  or less a day.     . Exercise 3x per week (30 min per time)     Recommend to exercise for 3 days a week for at least 30 minutes at a time.  Depression Screen PHQ 2/9 Scores 12/12/2019 12/07/2018 08/10/2017  PHQ - 2 Score 0 0 0    Fall Risk Fall Risk  12/12/2019 12/07/2018 08/10/2017  Falls in the past year? 0 0 No  Number falls in past yr: 0 0 -  Injury with Fall? 0 0 -    Any stairs in or around the home? Yes  If so, are there any without handrails? No  Home free of loose throw rugs in walkways, pet beds, electrical cords, etc? Yes  Adequate lighting in your home to reduce risk of falls? Yes   ASSISTIVE DEVICES UTILIZED TO PREVENT FALLS:  Life alert? No  Use of a cane, walker or w/c? Yes  Grab bars in the bathroom? Yes  Shower chair or bench in shower? No  Elevated toilet seat or a handicapped toilet? Yes   TIMED UP AND GO:   Was the test performed? Yes .  Length of time to ambulate 10 feet: 10 sec.   Gait steady and fast without use of assistive device  Cognitive Function: Declined today.          Immunizations Immunization History  Administered Date(s) Administered  . Fluad Quad(high Dose 65+) 10/06/2018, 11/02/2019  . Influenza, High Dose Seasonal PF 10/21/2017  . Influenza-Unspecified 12/27/2014, 10/28/2015  . PFIZER SARS-COV-2 Vaccination 03/31/2019, 04/21/2019, 11/03/2019  . Pneumococcal Conjugate-13 08/10/2017  . Pneumococcal Polysaccharide-23 12/10/2018  . Tdap 10/28/2010  . Zoster Recombinat (Shingrix) 08/19/2017, 12/09/2017    TDAP status: Up to date Flu Vaccine status: Up to date Pneumococcal vaccine status: Up to date Covid-19 vaccine status: Completed vaccines  Qualifies for Shingles Vaccine? Yes   Zostavax completed No   Shingrix Completed?: Yes  Screening Tests Health Maintenance  Topic Date Due  . MAMMOGRAM  11/01/2019  . TETANUS/TDAP  10/27/2020  . COLONOSCOPY  01/01/2023  . INFLUENZA VACCINE  Completed  . DEXA SCAN   Completed  . COVID-19 Vaccine  Completed  . Hepatitis C Screening  Completed  . PNA vac Low Risk Adult  Completed    Health Maintenance  Health Maintenance Due  Topic Date Due  . MAMMOGRAM  11/01/2019    Colorectal cancer screening: Completed 12/31/17. Repeat every 5 years Mammogram status: Completed 10/31/17. Repeat every year. Scheduled for 12/28/19. Bone Density status: Completed 10/15/17. Results reflect: Previous DEXA scan was normal. No repeat needed unless advised by a physician.  Lung Cancer Screening: (Low Dose CT Chest recommended if Age 67-80 years, 30 pack-year currently smoking OR have quit w/in 15years.) does not qualify.   Additional Screening:  Hepatitis C Screening: Up to date  Vision Screening: Recommended annual ophthalmology exams for early detection of glaucoma and other disorders of the eye. Is the patient up to date with their annual eye exam?  Yes  Who is the provider or what is the name of the office in which the patient attends annual eye exams? Dr Edison Pace @ [place] If pt is not established with a provider, would they like to be referred to a provider to establish care? No .   Dental Screening: Recommended annual dental exams for proper oral hygiene  Community Resource Referral / Chronic Care Management: CRR required this visit?  No   CCM required this visit?  No      Plan:     I have personally reviewed and noted the following in the patient's chart:   . Medical and social history . Use of alcohol, tobacco or illicit drugs  . Current medications and  supplements . Functional ability and status . Nutritional status . Physical activity . Advanced directives . List of other physicians . Hospitalizations, surgeries, and ER visits in previous 12 months . Vitals . Screenings to include cognitive, depression, and falls . Referrals and appointments  In addition, I have reviewed and discussed with patient certain preventive protocols, quality metrics, and  best practice recommendations. A written personalized care plan for preventive services as well as general preventive health recommendations were provided to patient.     Killian Schwer [place], [place]   [id number]   Nurse Notes: None.

## 2019-12-12 ENCOUNTER — Ambulatory Visit (INDEPENDENT_AMBULATORY_CARE_PROVIDER_SITE_OTHER): Payer: Medicare Other | Admitting: Family Medicine

## 2019-12-12 ENCOUNTER — Ambulatory Visit (INDEPENDENT_AMBULATORY_CARE_PROVIDER_SITE_OTHER): Payer: Medicare Other

## 2019-12-12 ENCOUNTER — Other Ambulatory Visit: Payer: Self-pay

## 2019-12-12 ENCOUNTER — Encounter: Payer: Self-pay | Admitting: Family Medicine

## 2019-12-12 VITALS — BP 140/58 | HR 71 | Temp 98.9°F | Ht 62.0 in | Wt 230.2 lb

## 2019-12-12 VITALS — BP 109/56 | HR 50 | Temp 98.7°F | Resp 16 | Ht 62.0 in | Wt 230.0 lb

## 2019-12-12 DIAGNOSIS — E559 Vitamin D deficiency, unspecified: Secondary | ICD-10-CM

## 2019-12-12 DIAGNOSIS — E039 Hypothyroidism, unspecified: Secondary | ICD-10-CM

## 2019-12-12 DIAGNOSIS — E7800 Pure hypercholesterolemia, unspecified: Secondary | ICD-10-CM | POA: Diagnosis not present

## 2019-12-12 DIAGNOSIS — G4700 Insomnia, unspecified: Secondary | ICD-10-CM

## 2019-12-12 DIAGNOSIS — I10 Essential (primary) hypertension: Secondary | ICD-10-CM

## 2019-12-12 DIAGNOSIS — Z0000 Encounter for general adult medical examination without abnormal findings: Secondary | ICD-10-CM | POA: Diagnosis not present

## 2019-12-12 DIAGNOSIS — Z6841 Body Mass Index (BMI) 40.0 and over, adult: Secondary | ICD-10-CM | POA: Diagnosis not present

## 2019-12-12 MED ORDER — VITAMIN D (ERGOCALCIFEROL) 1.25 MG (50000 UNIT) PO CAPS: ORAL_CAPSULE | ORAL | 3 refills | Status: DC

## 2019-12-12 MED ORDER — LEVOTHYROXINE SODIUM 75 MCG PO TABS
75.0000 ug | ORAL_TABLET | Freq: Every day | ORAL | 1 refills | Status: DC
Start: 2019-12-12 — End: 2020-07-08

## 2019-12-12 MED ORDER — TRAZODONE HCL 50 MG PO TABS: 25.0000 mg | ORAL_TABLET | Freq: Every evening | ORAL | 5 refills | Status: DC | PRN

## 2019-12-12 NOTE — Progress Notes (Signed)
Established patient visit    Patient: Olivia Dougherty   DOB: [birth year]   69 y.o. Female  MRN: [id number] Visit Date: 12/12/2019  Today's healthcare provider: [name], MD   Chief Complaint  Patient presents with  . Hyperlipidemia  . Hypertension  . Hypothyroidism  . Insomnia   Subjective    HPI  Hypertension, follow-up  BP Readings from Last 3 Encounters:  12/12/19 (!) 109/56  12/12/19 (!) 140/58  10/13/19 138/80   Wt Readings from Last 3 Encounters:  12/12/19 230 lb (104.3 kg)  12/12/19 230 lb 3.2 oz (104.4 kg)  10/13/19 224 lb 8 oz (101.8 kg)     She was last seen for hypertension 6 months ago.  BP at that visit was 128/69. Management since that visit includes no changes.  She reports excellent compliance with treatment. She is not having side effects.  She is following a Regular diet. She is not exercising. She does not smoke.  Use of agents associated with hypertension: none.   Outside blood pressures are normal. Symptoms: No chest pain No chest pressure  No palpitations No syncope  No dyspnea No orthopnea  No paroxysmal nocturnal dyspnea No lower extremity edema   Pertinent labs: Lab Results  Component Value Date   CHOL 165 12/13/2018   HDL 63 12/13/2018   LDLCALC 84 12/13/2018   TRIG 97 12/13/2018   CHOLHDL 2.8 05/21/2017   Lab Results  Component Value Date   NA 141 06/09/2019   K 4.3 06/09/2019   CREATININE 0.75 06/09/2019   GFRNONAA 83 06/09/2019   GFRAA 96 06/09/2019   GLUCOSE 91 06/09/2019     The 10-year ASCVD risk score Mikey Bussing DC Jr., et al., 2013) is: 5.9%   --------------------------------------------------------------------------------------------------- Lipid/Cholesterol, Follow-up  Last lipid panel Other pertinent labs  Lab Results  Component Value Date   CHOL 165 12/13/2018   HDL 63 12/13/2018   LDLCALC 84 12/13/2018   TRIG 97 12/13/2018   CHOLHDL 2.8 05/21/2017   Lab Results  Component Value Date    ALT 25 12/13/2018   AST 28 12/13/2018   PLT 198 12/13/2018   TSH 1.460 06/09/2019     She was last seen for this 6 months ago.  Management since that visit includes no changes.  She reports excellent compliance with treatment. She is not having side effects.   Symptoms: No chest pain No chest pressure/discomfort  No dyspnea No lower extremity edema  No numbness or tingling of extremity No orthopnea  No palpitations No paroxysmal nocturnal dyspnea  No speech difficulty No syncope   Current diet: in general, a "healthy" diet   Current exercise: none  The 10-year ASCVD risk score Mikey Bussing DC Jr., et al., 2013) is: 5.9%  --------------------------------------------------------------------------------------------------- Hypothyroid, follow-up  Lab Results  Component Value Date   TSH 1.460 06/09/2019   TSH 1.310 12/13/2018   TSH 0.951 12/01/2017   Wt Readings from Last 3 Encounters:  12/12/19 230 lb (104.3 kg)  12/12/19 230 lb 3.2 oz (104.4 kg)  10/13/19 224 lb 8 oz (101.8 kg)    She was last seen for hypothyroid 6 months ago.  Management since that visit includes no changes. She reports excellent compliance with treatment. She is not having side effects.   Symptoms: No change in energy level No constipation  No diarrhea No heat / cold intolerance  No nervousness No palpitations  No weight changes    -----------------------------------------------------------------------------------------   Patient Active Problem List   Diagnosis  Date Noted  . Insomnia 12/12/2019  . Polyarthralgia 12/10/2018  . Vitamin D deficiency 12/10/2018  . Personal history of colonic polyps   . Acute right-sided low back pain without sciatica 12/02/2017  . Morbid obesity ([place]) 09/21/2017  . Essential hypertension 08/10/2017  . Hyperlipidemia 08/10/2017  . OSA (obstructive sleep apnea) 08/10/2017  . Allergic rhinitis 08/10/2017  . GAD (generalized anxiety disorder) 08/10/2017  . GERD  (gastroesophageal reflux disease) 08/10/2017  . Special screening for malignant neoplasms, colon   . Benign neoplasm of ascending colon   . Benign neoplasm of descending colon   . Current tear knee, medial meniscus 05/25/2014  . Arthritis of knee, degenerative 05/25/2014  . Squamous cell carcinoma 01/13/2014  . History of basal cell carcinoma 01/13/2014  . Osteoarthritis 07/29/2011  . Coronary atherosclerosis of native coronary artery 01/01/2006  . Acquired hypothyroidism 01/01/2006   Past Medical History:  Diagnosis Date  . Anxiety   . Arthritis    lower back  . Basal cell carcinoma 11/2011   face, left leg-using chemo cream on temple area  . Coronary artery disease 2005   LAD PCI with Cypher drug-eluting stent placement 3.0 x 13 mm with balloon angioplasty of diagonal.  Cardiac catheterization in 2010 showed patent stent with minimal restenosis.  Marland Kitchen GERD (gastroesophageal reflux disease)   . Heart disease   . Heart problem    98% blockage  . Hypercholesteremia   . Hypertension   . Hypothyroidism   . Kidney stone   . Osteoarthritis   . PONV (postoperative nausea and vomiting)    after 1 procedure approx 10 yrs ago  . Sleep apnea    mild, could not tol cpap  . Squamous cell carcinoma of skin    leg   Past Surgical History:  Procedure Laterality Date  . ANGIOPLASTY  2005   CAD   98% blockage  . CARDIAC CATHETERIZATION  02/2008  . CATARACT EXTRACTION Right   . CATARACT EXTRACTION W/PHACO Right 08/31/2019   Procedure: CATARACT EXTRACTION PHACO AND INTRAOCULAR LENS PLACEMENT ([place]) RIGHT;  Surgeon: Eulogio Bear, MD;  Location: [facility];  Service: Ophthalmology;  Laterality: Right;  4.41 0:30.7  . CATARACT EXTRACTION W/PHACO Left 09/26/2019   Procedure: CATARACT EXTRACTION PHACO AND INTRAOCULAR LENS PLACEMENT ([facility]) LEFT 1.60 00:20.3;  Surgeon: Eulogio Bear, MD;  Location: [facility];  Service: Ophthalmology;  Laterality: Left;  . COLONOSCOPY WITH  PROPOFOL N/A 12/29/2014   Procedure: COLONOSCOPY WITH PROPOFOL;  Surgeon: Lucilla Lame, MD;  Location: [facility];  Service: Endoscopy;  Laterality: N/A;  . COLONOSCOPY WITH PROPOFOL N/A 12/31/2017   Procedure: COLONOSCOPY WITH PROPOFOL;  Surgeon: Lucilla Lame, MD;  Location: [facility];  Service: Endoscopy;  Laterality: N/A;  sleep apnea  . CORONARY ANGIOPLASTY  2005   LAD  . EYE SURGERY Right   . HYSTEROSCOPY  11/2001   resection of benign leiomyoma  . KNEE ARTHROSCOPY Right 08/16/2014   Procedure: ARTHROSCOPY KNEE, partial lateral menisectomy and chondroplasty;  Surgeon: Dereck Leep, MD;  Location: [facility];  Service: Orthopedics;  Laterality: Right;  . POLYPECTOMY  12/29/2014   Procedure: POLYPECTOMY;  Surgeon: Lucilla Lame, MD;  Location: [facility];  Service: Endoscopy;;  . SKIN CANCER EXCISION Right 02/2018   Right leg - Squamous   . THYROID CYST EXCISION Left 10/2002   left lobe thyroid excision  . THYROIDECTOMY, PARTIAL Left   . TUBAL LIGATION  1980   BTL  . UMBILICAL HERNIA REPAIR  1980  . vulvar cancer  1993   laser   Social History   Socioeconomic History  . Marital status: Married    Spouse name: Edd Arbour  . Number of children: 3  . Years of education: 62  . Highest education level: High school graduate  Occupational History  . Occupation: retired  Tobacco Use  . Smoking status: Never Smoker  . Smokeless tobacco: Never Used  Vaping Use  . Vaping Use: Never used  Substance and Sexual Activity  . Alcohol use: Yes    Alcohol/week: 2.0 standard drinks    Types: 2 Glasses of wine per week    Comment: weekends  . Drug use: No  . Sexual activity: Yes    Partners: Male    Birth control/protection: Surgical  Other Topics Concern  . Not on file  Social History Narrative   2 living children; one son deceased from overdose.   Social Determinants of Health   Financial Resource Strain: Low Risk   . Difficulty of Paying Living Expenses: Not  hard at all  Food Insecurity: No Food Insecurity  . Worried About Charity fundraiser in the Last Year: Never true  . Ran Out of Food in the Last Year: Never true  Transportation Needs: No Transportation Needs  . Lack of Transportation (Medical): No  . Lack of Transportation (Non-Medical): No  Physical Activity: Inactive  . Days of Exercise per Week: 0 days  . Minutes of Exercise per Session: 0 min  Stress: No Stress Concern Present  . Feeling of Stress : Only a little  Social Connections: Moderately Isolated  . Frequency of Communication with Friends and Family: More than three times a week  . Frequency of Social Gatherings with Friends and Family: Twice a week  . Attends Religious Services: Never  . Active Member of Clubs or Organizations: No  . Attends Archivist Meetings: Never  . Marital Status: Married  Human resources officer Violence: Not At Risk  . Fear of Current or Ex-Partner: No  . Emotionally Abused: No  . Physically Abused: No  . Sexually Abused: No   Family History  Problem Relation Age of Onset  . Hypertension Mother   . Hypertension Father   . Diabetes Father   . Heart disease Father   . Cervical cancer Sister   . Stroke Sister   . Colon cancer Neg Hx   . Breast cancer Neg Hx   . Ovarian cancer Neg Hx    Allergies  Allergen Reactions  . Atorvastatin Other (See Comments)    Other reaction(s): myalgia  . Codeine Shortness Of Breath  . Ace Inhibitors Cough       . Enalapril Cough  . Penicillins Hives       . Sulfa Antibiotics Hives and Swelling       . Adhesive [Tape] Rash            Medications: Outpatient Medications Prior to Visit  Medication Sig  . aspirin 81 MG tablet Take 81 mg by mouth daily.  Marland Kitchen augmented betamethasone dipropionate (DIPROLENE-AF) 0.05 % cream Apply 1 application topically as needed. For red bumps  . clopidogrel (PLAVIX) 75 MG tablet Take 1 tablet (75 mg total) by mouth daily with breakfast.  . cyclobenzaprine  (FLEXERIL) 5 MG tablet TAKE ONE TABLET BY MOUTH THREE TIMES A DAY AS NEEDED FOR MUSCLE SPASMS  . diclofenac Sodium (VOLTAREN) 1 % GEL Apply topically as needed.  . ezetimibe (ZETIA) 10 MG tablet Take 1  tablet (10 mg total) by mouth daily.  Marland Kitchen losartan (COZAAR) 50 MG tablet Take 2 tablets (100 mg total) by mouth daily.  . metoprolol tartrate (LOPRESSOR) 25 MG tablet Take 1 tablet (25 mg total) by mouth 2 (two) times daily.  . Multiple Vitamin (MULTIVITAMIN) capsule Take 1 capsule by mouth daily.  . rosuvastatin (CRESTOR) 10 MG tablet Take 1 tablet (10 mg total) by mouth daily.  . [DISCONTINUED] levothyroxine (SYNTHROID) 75 MCG tablet Take 1 tablet (75 mcg total) by mouth daily before breakfast.  . [DISCONTINUED] traZODone (DESYREL) 50 MG tablet Take 25-50 mg by mouth at bedtime as needed for sleep.  . [DISCONTINUED] Vitamin D, Ergocalciferol, (DRISDOL) 1.25 MG (50000 UT) CAPS capsule TAKE 1 CAPSULE BY MOUTH EVERY 14 DAYS  . Multiple Vitamins-Minerals (MULTIVITAMIN PO) Take by mouth daily. (Patient not taking: Reported on 12/12/2019)  . [DISCONTINUED] doxycycline (VIBRA-TABS) 100 MG tablet Take 1 tablet (100 mg total) by mouth 2 (two) times daily. (Patient not taking: Reported on 09/26/2019)   No facility-administered medications prior to visit.    Review of Systems  Constitutional: Positive for fatigue.  HENT: Negative.   Eyes: Negative.   Respiratory: Negative.   Cardiovascular: Negative.   Gastrointestinal: Positive for constipation.  Endocrine: Negative.   Genitourinary: Negative.   Musculoskeletal: Negative.   Skin: Negative.   Allergic/Immunologic: Negative.   Hematological: Negative.   Psychiatric/Behavioral: Negative.     Last CBC Lab Results  Component Value Date   WBC 5.2 12/13/2018   HGB 13.7 12/13/2018   HCT 39.5 12/13/2018   MCV 92 12/13/2018   MCH 31.9 12/13/2018   RDW 11.9 12/13/2018   PLT 198 17/00/1749   Last metabolic panel Lab Results  Component Value Date    GLUCOSE 91 06/09/2019   NA 141 06/09/2019   K 4.3 06/09/2019   CL 105 06/09/2019   CO2 25 06/09/2019   BUN 13 06/09/2019   CREATININE 0.75 06/09/2019   GFRNONAA 83 06/09/2019   GFRAA 96 06/09/2019   CALCIUM 9.6 06/09/2019   PROT 6.5 12/13/2018   ALBUMIN 3.9 12/13/2018   LABGLOB 2.6 12/13/2018   AGRATIO 1.5 12/13/2018   BILITOT 0.7 12/13/2018   ALKPHOS 90 12/13/2018   AST 28 12/13/2018   ALT 25 12/13/2018   ANIONGAP 6 08/10/2014   Last lipids Lab Results  Component Value Date   CHOL 165 12/13/2018   HDL 63 12/13/2018   LDLCALC 84 12/13/2018   TRIG 97 12/13/2018   CHOLHDL 2.8 05/21/2017   Last hemoglobin A1c No results found for: HGBA1C Last thyroid functions Lab Results  Component Value Date   TSH 1.460 06/09/2019   Last vitamin D Lab Results  Component Value Date   VD25OH 38.3 06/09/2019   Last vitamin B12 and Folate No results found for: VITAMINB12, FOLATE    Objective    BP (!) 109/56 (BP Location: Right Arm, Patient Position: Sitting, Cuff Size: Normal)   Pulse (!) 50   Temp 98.7 F (37.1 C) (Oral)   Resp 16   Ht 5\' 2"  (1.575 m)   Wt 230 lb (104.3 kg)   LMP 01/28/2007 (Approximate)   BMI 42.07 kg/m  BP Readings from Last 3 Encounters:  12/12/19 (!) 109/56  12/12/19 (!) 140/58  10/13/19 138/80   Wt Readings from Last 3 Encounters:  12/12/19 230 lb (104.3 kg)  12/12/19 230 lb 3.2 oz (104.4 kg)  10/13/19 224 lb 8 oz (101.8 kg)      Physical Exam Vitals reviewed.  Constitutional:      General: She is not in acute distress.    Appearance: Normal appearance. She is well-developed. She is not diaphoretic.  HENT:     Head: Normocephalic and atraumatic.     Right Ear: Tympanic membrane, ear canal and external ear normal.     Left Ear: Tympanic membrane, ear canal and external ear normal.     Nose: Nose normal.     Mouth/Throat:     Mouth: Mucous membranes are moist.     Pharynx: Oropharynx is clear. No oropharyngeal exudate.  Eyes:      General: No scleral icterus.    Conjunctiva/sclera: Conjunctivae normal.     Pupils: Pupils are equal, round, and reactive to light.  Neck:     Thyroid: No thyromegaly.  Cardiovascular:     Rate and Rhythm: Normal rate and regular rhythm.     Pulses: Normal pulses.     Heart sounds: Normal heart sounds. No murmur heard.   Pulmonary:     Effort: Pulmonary effort is normal. No respiratory distress.     Breath sounds: Normal breath sounds. No wheezing or rales.  Abdominal:     General: There is no distension.     Palpations: Abdomen is soft.     Tenderness: There is no abdominal tenderness.  Musculoskeletal:        General: No deformity.     Cervical back: Neck supple.     Right lower leg: No edema.     Left lower leg: No edema.  Lymphadenopathy:     Cervical: No cervical adenopathy.  Skin:    General: Skin is warm and dry.     Findings: No rash.  Neurological:     Mental Status: She is alert and oriented to person, place, and time. Mental status is at baseline.     Gait: Gait normal.  Psychiatric:        Mood and Affect: Mood normal.        Behavior: Behavior normal.        Thought Content: Thought content normal.       No results found for any visits on 12/12/19.  Assessment & Plan     Problem List Items Addressed This Visit      Cardiovascular and Mediastinum   Essential hypertension - Primary    Well controlled on recheck Continue current medications Recheck metabolic panel F/u in 6 months       Relevant Orders   Comprehensive metabolic panel     Endocrine   Acquired hypothyroidism    Previously well controlled Continue Synthroid at current dose  Recheck TSH and adjust Synthroid as indicated        Relevant Medications   levothyroxine (SYNTHROID) 75 MCG tablet   Other Relevant Orders   TSH     Other   Hyperlipidemia    Continue statin Recently started zetia Repeat FLP and CMP Goal LDL < 70      Relevant Orders   Comprehensive metabolic  panel   Lipid panel   Morbid obesity ([place])    Discussed importance of healthy weight management Discussed diet and exercise       Relevant Orders   Comprehensive metabolic panel   Lipid panel   Vitamin D deficiency    Continue high dose supplement Recheck level      Relevant Medications   Vitamin D, Ergocalciferol, (DRISDOL) 1.25 MG (50000 UNIT) CAPS capsule   Other Relevant Orders   VITAMIN D 25  Hydroxy (Vit-D Deficiency, Fractures)   Insomnia    Continue trazodone prn       Other Visit Diagnoses    BMI 40.0-44.9, adult ([place])           Return in about 6 months (around 06/10/2020) for chronic disease f/u.      I, [name], MD, have reviewed all documentation for this visit. The documentation on 12/12/19 for the exam, diagnosis, procedures, and orders are all accurate and complete.   Dameir Gentzler, Dionne Bucy, MD, MPH [facility]

## 2019-12-12 NOTE — Assessment & Plan Note (Signed)
Continue statin Recently started zetia Repeat FLP and CMP Goal LDL < 70

## 2019-12-12 NOTE — Assessment & Plan Note (Signed)
Continue high dose supplement Recheck level

## 2019-12-12 NOTE — Assessment & Plan Note (Signed)
Continue trazodone prn. 

## 2019-12-12 NOTE — Patient Instructions (Signed)
Preventive Care 67 Years and Older, Female Preventive care refers to lifestyle choices and visits with your health care provider that can promote health and wellness. This includes:  A yearly physical exam. This is also called an annual well check.  Regular dental and eye exams.  Immunizations.  Screening for certain conditions.  Healthy lifestyle choices, such as diet and exercise. What can I expect for my preventive care visit? Physical exam Your health care provider will check:  Height and weight. These may be used to calculate body mass index (BMI), which is a measurement that tells if you are at a healthy weight.  Heart rate and blood pressure.  Your skin for abnormal spots. Counseling Your health care provider may ask you questions about:  Alcohol, tobacco, and drug use.  Emotional well-being.  Home and relationship well-being.  Sexual activity.  Eating habits.  History of falls.  Memory and ability to understand (cognition).  Work and work Statistician.  Pregnancy and menstrual history. What immunizations do I need?  Influenza (flu) vaccine  This is recommended every year. Tetanus, diphtheria, and pertussis (Tdap) vaccine  You may need a Td booster every 10 years. Varicella (chickenpox) vaccine  You may need this vaccine if you have not already been vaccinated. Zoster (shingles) vaccine  You may need this after age 33. Pneumococcal conjugate (PCV13) vaccine  One dose is recommended after age 33. Pneumococcal polysaccharide (PPSV23) vaccine  One dose is recommended after age 72. Measles, mumps, and rubella (MMR) vaccine  You may need at least one dose of MMR if you were born in 1957 or later. You may also need a second dose. Meningococcal conjugate (MenACWY) vaccine  You may need this if you have certain conditions. Hepatitis A vaccine  You may need this if you have certain conditions or if you travel or work in places where you may be exposed  to hepatitis A. Hepatitis B vaccine  You may need this if you have certain conditions or if you travel or work in places where you may be exposed to hepatitis B. Haemophilus influenzae type b (Hib) vaccine  You may need this if you have certain conditions. You may receive vaccines as individual doses or as more than one vaccine together in one shot (combination vaccines). Talk with your health care provider about the risks and benefits of combination vaccines. What tests do I need? Blood tests  Lipid and cholesterol levels. These may be checked every 5 years, or more frequently depending on your overall health.  Hepatitis C test.  Hepatitis B test. Screening  Lung cancer screening. You may have this screening every year starting at age 39 if you have a 30-pack-year history of smoking and currently smoke or have quit within the past 15 years.  Colorectal cancer screening. All adults should have this screening starting at age 36 and continuing until age 15. Your health care provider may recommend screening at age 23 if you are at increased risk. You will have tests every 1-10 years, depending on your results and the type of screening test.  Diabetes screening. This is done by checking your blood sugar (glucose) after you have not eaten for a while (fasting). You may have this done every 1-3 years.  Mammogram. This may be done every 1-2 years. Talk with your health care provider about how often you should have regular mammograms.  BRCA-related cancer screening. This may be done if you have a family history of breast, ovarian, tubal, or peritoneal cancers.  Other tests  Sexually transmitted disease (STD) testing.  Bone density scan. This is done to screen for osteoporosis. You may have this done starting at age 44. Follow these instructions at home: Eating and drinking  Eat a diet that includes fresh fruits and vegetables, whole grains, lean protein, and low-fat dairy products. Limit  your intake of foods with high amounts of sugar, saturated fats, and salt.  Take vitamin and mineral supplements as recommended by your health care provider.  Do not drink alcohol if your health care provider tells you not to drink.  If you drink alcohol: ? Limit how much you have to 0-1 drink a day. ? Be aware of how much alcohol is in your drink. In the [place] one drink equals one 12 oz bottle of beer (355 mL), one 5 oz glass of wine (148 mL), or one 1 oz glass of hard liquor (44 mL). Lifestyle  Take daily care of your teeth and gums.  Stay active. Exercise for at least 30 minutes on 5 or more days each week.  Do not use any products that contain nicotine or tobacco, such as cigarettes, e-cigarettes, and chewing tobacco. If you need help quitting, ask your health care provider.  If you are sexually active, practice safe sex. Use a condom or other form of protection in order to prevent STIs (sexually transmitted infections).  Talk with your health care provider about taking a low-dose aspirin or statin. What's next?  Go to your health care provider once a year for a well check visit.  Ask your health care provider how often you should have your eyes and teeth checked.  Stay up to date on all vaccines. This information is not intended to replace advice given to you by your health care provider. Make sure you discuss any questions you have with your health care provider. Document Revised: 01/07/2018 Document Reviewed: 01/07/2018 Elsevier Patient Education  2020 [employer].

## 2019-12-12 NOTE — Assessment & Plan Note (Signed)
Previously well controlled Continue Synthroid at current dose  Recheck TSH and adjust Synthroid as indicated   

## 2019-12-12 NOTE — Patient Instructions (Signed)
Olivia Dougherty , Thank you for taking time to come for your Medicare Wellness Visit. I appreciate your ongoing commitment to your health goals. Please review the following plan we discussed and let me know if I can assist you in the future.   Screening recommendations/referrals: Colonoscopy: Up to date, due 12/2022 Mammogram: Currently due, apt scheduled 12/28/19 Bone Density: Previous DEXA scan was normal. No repeat needed unless advised by a physician. Recommended yearly ophthalmology/optometry visit for glaucoma screening and checkup Recommended yearly dental visit for hygiene and checkup  Vaccinations: Influenza vaccine: Done 11/02/19 Pneumococcal vaccine: Completed series Tdap vaccine: Up to date, due 10/2020 Shingles vaccine: Completed series    Advanced directives: Please bring a copy of your POA (Power of Attorney) and/or Living Will to your next appointment.   Conditions/risks identified: Obesity- recommend to exercise for 3 days a week for at least 30 minutes at a time.   Next appointment: 10:40 AM today with Dr Brita Romp    Preventive Care 71 Years and Older, Female Preventive care refers to lifestyle choices and visits with your health care provider that can promote health and wellness. What does preventive care include?  A yearly physical exam. This is also called an annual well check.  Dental exams once or twice a year.  Routine eye exams. Ask your health care provider how often you should have your eyes checked.  Personal lifestyle choices, including:  Daily care of your teeth and gums.  Regular physical activity.  Eating a healthy diet.  Avoiding tobacco and drug use.  Limiting alcohol use.  Practicing safe sex.  Taking low-dose aspirin every day.  Taking vitamin and mineral supplements as recommended by your health care provider. What happens during an annual well check? The services and screenings done by your health care provider during your annual well  check will depend on your age, overall health, lifestyle risk factors, and family history of disease. Counseling  Your health care provider may ask you questions about your:  Alcohol use.  Tobacco use.  Drug use.  Emotional well-being.  Home and relationship well-being.  Sexual activity.  Eating habits.  History of falls.  Memory and ability to understand (cognition).  Work and work Statistician.  Reproductive health. Screening  You may have the following tests or measurements:  Height, weight, and BMI.  Blood pressure.  Lipid and cholesterol levels. These may be checked every 5 years, or more frequently if you are over 31 years old.  Skin check.  Lung cancer screening. You may have this screening every year starting at age 27 if you have a 30-pack-year history of smoking and currently smoke or have quit within the past 15 years.  Fecal occult blood test (FOBT) of the stool. You may have this test every year starting at age 26.  Flexible sigmoidoscopy or colonoscopy. You may have a sigmoidoscopy every 5 years or a colonoscopy every 10 years starting at age 79.  Hepatitis C blood test.  Hepatitis B blood test.  Sexually transmitted disease (STD) testing.  Diabetes screening. This is done by checking your blood sugar (glucose) after you have not eaten for a while (fasting). You may have this done every 1-3 years.  Bone density scan. This is done to screen for osteoporosis. You may have this done starting at age 66.  Mammogram. This may be done every 1-2 years. Talk to your health care provider about how often you should have regular mammograms. Talk with your health care provider about your  test results, treatment options, and if necessary, the need for more tests. Vaccines  Your health care provider may recommend certain vaccines, such as:  Influenza vaccine. This is recommended every year.  Tetanus, diphtheria, and acellular pertussis (Tdap, Td) vaccine. You  may need a Td booster every 10 years.  Zoster vaccine. You may need this after age 75.  Pneumococcal 13-valent conjugate (PCV13) vaccine. One dose is recommended after age 21.  Pneumococcal polysaccharide (PPSV23) vaccine. One dose is recommended after age 37. Talk to your health care provider about which screenings and vaccines you need and how often you need them. This information is not intended to replace advice given to you by your health care provider. Make sure you discuss any questions you have with your health care provider. Document Released: 02/09/2015 Document Revised: 10/03/2015 Document Reviewed: 11/14/2014 Elsevier Interactive Patient Education  2017 [place] Prevention in the Home Falls can cause injuries. They can happen to people of all ages. There are many things you can do to make your home safe and to help prevent falls. What can I do on the outside of my home?  Regularly fix the edges of walkways and driveways and fix any cracks.  Remove anything that might make you trip as you walk through a door, such as a raised step or threshold.  Trim any bushes or trees on the path to your home.  Use bright outdoor lighting.  Clear any walking paths of anything that might make someone trip, such as rocks or tools.  Regularly check to see if handrails are loose or broken. Make sure that both sides of any steps have handrails.  Any raised decks and porches should have guardrails on the edges.  Have any leaves, snow, or ice cleared regularly.  Use sand or salt on walking paths during winter.  Clean up any spills in your garage right away. This includes oil or grease spills. What can I do in the bathroom?  Use night lights.  Install grab bars by the toilet and in the tub and shower. Do not use towel bars as grab bars.  Use non-skid mats or decals in the tub or shower.  If you need to sit down in the shower, use a plastic, non-slip stool.  Keep the floor  dry. Clean up any water that spills on the floor as soon as it happens.  Remove soap buildup in the tub or shower regularly.  Attach bath mats securely with double-sided non-slip rug tape.  Do not have throw rugs and other things on the floor that can make you trip. What can I do in the bedroom?  Use night lights.  Make sure that you have a light by your bed that is easy to reach.  Do not use any sheets or blankets that are too big for your bed. They should not hang down onto the floor.  Have a firm chair that has side arms. You can use this for support while you get dressed.  Do not have throw rugs and other things on the floor that can make you trip. What can I do in the kitchen?  Clean up any spills right away.  Avoid walking on wet floors.  Keep items that you use a lot in easy-to-reach places.  If you need to reach something above you, use a strong step stool that has a grab bar.  Keep electrical cords out of the way.  Do not use floor polish or wax that  makes floors slippery. If you must use wax, use non-skid floor wax.  Do not have throw rugs and other things on the floor that can make you trip. What can I do with my stairs?  Do not leave any items on the stairs.  Make sure that there are handrails on both sides of the stairs and use them. Fix handrails that are broken or loose. Make sure that handrails are as long as the stairways.  Check any carpeting to make sure that it is firmly attached to the stairs. Fix any carpet that is loose or worn.  Avoid having throw rugs at the top or bottom of the stairs. If you do have throw rugs, attach them to the floor with carpet tape.  Make sure that you have a light switch at the top of the stairs and the bottom of the stairs. If you do not have them, ask someone to add them for you. What else can I do to help prevent falls?  Wear shoes that:  Do not have high heels.  Have rubber bottoms.  Are comfortable and fit you  well.  Are closed at the toe. Do not wear sandals.  If you use a stepladder:  Make sure that it is fully opened. Do not climb a closed stepladder.  Make sure that both sides of the stepladder are locked into place.  Ask someone to hold it for you, if possible.  Clearly mark and make sure that you can see:  Any grab bars or handrails.  First and last steps.  Where the edge of each step is.  Use tools that help you move around (mobility aids) if they are needed. These include:  Canes.  Walkers.  Scooters.  Crutches.  Turn on the lights when you go into a dark area. Replace any light bulbs as soon as they burn out.  Set up your furniture so you have a clear path. Avoid moving your furniture around.  If any of your floors are uneven, fix them.  If there are any pets around you, be aware of where they are.  Review your medicines with your doctor. Some medicines can make you feel dizzy. This can increase your chance of falling. Ask your doctor what other things that you can do to help prevent falls. This information is not intended to replace advice given to you by your health care provider. Make sure you discuss any questions you have with your health care provider. Document Released: 11/09/2008 Document Revised: 06/21/2015 Document Reviewed: 02/17/2014 Elsevier Interactive Patient Education  2017 [employer].

## 2019-12-12 NOTE — Assessment & Plan Note (Signed)
Discussed importance of healthy weight management Discussed diet and exercise  

## 2019-12-12 NOTE — Assessment & Plan Note (Signed)
Well controlled on recheck Continue current medications Recheck metabolic panel F/u in 6 months  

## 2019-12-13 LAB — COMPREHENSIVE METABOLIC PANEL
ALT: 20 IU/L (ref 0–32)
AST: 23 IU/L (ref 0–40)
Albumin/Globulin Ratio: 1.8 (ref 1.2–2.2)
Albumin: 4.1 g/dL (ref 3.8–4.8)
Alkaline Phosphatase: 89 IU/L (ref 44–121)
BUN/Creatinine Ratio: 15 (ref 12–28)
BUN: 10 mg/dL (ref 8–27)
Bilirubin Total: 0.5 mg/dL (ref 0.0–1.2)
CO2: 25 mmol/L (ref 20–29)
Calcium: 9.2 mg/dL (ref 8.7–10.3)
Chloride: 106 mmol/L (ref 96–106)
Creatinine, Ser: 0.66 mg/dL (ref 0.57–1.00)
GFR calc Af Amer: 106 mL/min/{1.73_m2} (ref >59)
GFR calc non Af Amer: 92 mL/min/{1.73_m2} (ref >59)
Globulin, Total: 2.3 g/dL (ref 1.5–4.5)
Glucose: 97 mg/dL (ref 65–99)
Potassium: 4.2 mmol/L (ref 3.5–5.2)
Sodium: 143 mmol/L (ref 134–144)
Total Protein: 6.4 g/dL (ref 6.0–8.5)

## 2019-12-13 LAB — LIPID PANEL
Chol/HDL Ratio: 2.5 ratio (ref 0.0–4.4)
Cholesterol, Total: 159 mg/dL (ref 100–199)
HDL: 64 mg/dL (ref >39)
LDL Chol Calc (NIH): 77 mg/dL (ref 0–99)
Triglycerides: 102 mg/dL (ref 0–149)
VLDL Cholesterol Cal: 18 mg/dL (ref 5–40)

## 2019-12-13 LAB — TSH: TSH: 0.762 u[IU]/mL (ref 0.450–4.500)

## 2019-12-13 LAB — VITAMIN D 25 HYDROXY (VIT D DEFICIENCY, FRACTURES): Vit D, 25-Hydroxy: 34.1 ng/mL (ref 30.0–100.0)

## 2019-12-15 ENCOUNTER — Ambulatory Visit: Payer: Medicare Other | Admitting: Family Medicine

## 2019-12-28 ENCOUNTER — Other Ambulatory Visit: Payer: Self-pay

## 2019-12-28 ENCOUNTER — Ambulatory Visit
Admission: RE | Admit: 2019-12-28 | Discharge: 2019-12-28 | Disposition: A | Discharge status: Home / Self Care | Payer: Medicare Other | Source: Ambulatory Visit | Attending: Obstetrics & Gynecology | Admitting: Obstetrics & Gynecology

## 2019-12-28 DIAGNOSIS — Z1231 Encounter for screening mammogram for malignant neoplasm of breast: Secondary | ICD-10-CM

## 2020-02-02 ENCOUNTER — Ambulatory Visit: Payer: PRIVATE HEALTH INSURANCE | Admitting: Obstetrics & Gynecology

## 2020-05-02 ENCOUNTER — Other Ambulatory Visit: Payer: Self-pay

## 2020-05-02 ENCOUNTER — Ambulatory Visit (INDEPENDENT_AMBULATORY_CARE_PROVIDER_SITE_OTHER): Payer: Medicare Other

## 2020-05-02 DIAGNOSIS — Z23 Encounter for immunization: Secondary | ICD-10-CM | POA: Diagnosis not present

## 2020-06-01 ENCOUNTER — Emergency Department
Admission: EM | Admit: 2020-06-01 | Discharge: 2020-06-02 | Disposition: A | Discharge status: Home / Self Care | Payer: Medicare Other | Attending: Emergency Medicine | Admitting: Emergency Medicine

## 2020-06-01 ENCOUNTER — Emergency Department: Payer: Medicare Other

## 2020-06-01 DIAGNOSIS — R1032 Left lower quadrant pain: Secondary | ICD-10-CM | POA: Insufficient documentation

## 2020-06-01 DIAGNOSIS — Z85828 Personal history of other malignant neoplasm of skin: Secondary | ICD-10-CM | POA: Diagnosis not present

## 2020-06-01 DIAGNOSIS — K5792 Diverticulitis of intestine, part unspecified, without perforation or abscess without bleeding: Secondary | ICD-10-CM | POA: Diagnosis not present

## 2020-06-01 DIAGNOSIS — I2510 Atherosclerotic heart disease of native coronary artery without angina pectoris: Secondary | ICD-10-CM | POA: Insufficient documentation

## 2020-06-01 DIAGNOSIS — K449 Diaphragmatic hernia without obstruction or gangrene: Secondary | ICD-10-CM

## 2020-06-01 DIAGNOSIS — Z8742 Personal history of other diseases of the female genital tract: Secondary | ICD-10-CM | POA: Diagnosis not present

## 2020-06-01 DIAGNOSIS — Z79899 Other long term (current) drug therapy: Secondary | ICD-10-CM | POA: Insufficient documentation

## 2020-06-01 DIAGNOSIS — Z7982 Long term (current) use of aspirin: Secondary | ICD-10-CM | POA: Insufficient documentation

## 2020-06-01 DIAGNOSIS — Z7901 Long term (current) use of anticoagulants: Secondary | ICD-10-CM | POA: Diagnosis not present

## 2020-06-01 DIAGNOSIS — K5730 Diverticulosis of large intestine without perforation or abscess without bleeding: Secondary | ICD-10-CM | POA: Diagnosis not present

## 2020-06-01 DIAGNOSIS — E039 Hypothyroidism, unspecified: Secondary | ICD-10-CM | POA: Insufficient documentation

## 2020-06-01 DIAGNOSIS — I700 Atherosclerosis of aorta: Secondary | ICD-10-CM

## 2020-06-01 DIAGNOSIS — I10 Essential (primary) hypertension: Secondary | ICD-10-CM | POA: Insufficient documentation

## 2020-06-01 DIAGNOSIS — K769 Liver disease, unspecified: Secondary | ICD-10-CM

## 2020-06-01 DIAGNOSIS — N200 Calculus of kidney: Secondary | ICD-10-CM | POA: Diagnosis not present

## 2020-06-01 LAB — COMPREHENSIVE METABOLIC PANEL
ALT: 21 U/L (ref 0–44)
AST: 29 U/L (ref 15–41)
Albumin: 4.4 g/dL (ref 3.5–5.0)
Alkaline Phosphatase: 76 U/L (ref 38–126)
Anion gap: 9 (ref 5–15)
BUN: 16 mg/dL (ref 8–23)
CO2: 27 mmol/L (ref 22–32)
Calcium: 9.3 mg/dL (ref 8.9–10.3)
Chloride: 104 mmol/L (ref 98–111)
Creatinine, Ser: 0.82 mg/dL (ref 0.44–1.00)
GFR, Estimated: >60 mL/min (ref >60)
Glucose, Bld: 108 mg/dL — ABNORMAL HIGH (ref 70–99)
Potassium: 3.9 mmol/L (ref 3.5–5.1)
Sodium: 140 mmol/L (ref 135–145)
Total Bilirubin: 0.7 mg/dL (ref 0.3–1.2)
Total Protein: 7.5 g/dL (ref 6.5–8.1)

## 2020-06-01 LAB — URINALYSIS, COMPLETE (UACMP) WITH MICROSCOPIC
Bacteria, UA: NONE SEEN
Bilirubin Urine: NEGATIVE
Glucose, UA: NEGATIVE mg/dL
Ketones, ur: NEGATIVE mg/dL
Leukocytes,Ua: NEGATIVE
Nitrite: NEGATIVE
Protein, ur: NEGATIVE mg/dL
Specific Gravity, Urine: 1.006 (ref 1.005–1.030)
Squamous Epithelial / HPF: NONE SEEN (ref 0–5)
pH: 6 (ref 5.0–8.0)

## 2020-06-01 LAB — LIPASE, BLOOD: Lipase: 25 U/L (ref 11–51)

## 2020-06-01 LAB — CBC
HCT: 44.3 % (ref 36.0–46.0)
Hemoglobin: 15 g/dL (ref 12.0–15.0)
MCH: 31.7 pg (ref 26.0–34.0)
MCHC: 33.9 g/dL (ref 30.0–36.0)
MCV: 93.7 fL (ref 80.0–100.0)
Platelets: 209 10*3/uL (ref 150–400)
RBC: 4.73 MIL/uL (ref 3.87–5.11)
RDW: 11.9 % (ref 11.5–15.5)
WBC: 8.7 10*3/uL (ref 4.0–10.5)
nRBC: 0 % (ref 0.0–0.2)

## 2020-06-01 MED ORDER — ONDANSETRON HCL 4 MG/2ML IJ SOLN
4.0000 mg | Freq: Once | INTRAMUSCULAR | Status: AC
  Administered 2020-06-01: 4 mg via INTRAVENOUS
  Filled 2020-06-01: qty 2

## 2020-06-01 MED ORDER — IOHEXOL 300 MG/ML  SOLN
100.0000 mL | Freq: Once | INTRAMUSCULAR | Status: AC | PRN
  Administered 2020-06-01: 100 mL via INTRAVENOUS

## 2020-06-01 MED ORDER — MORPHINE SULFATE (PF) 4 MG/ML IV SOLN
4.0000 mg | Freq: Once | INTRAVENOUS | Status: AC
Start: 2020-06-01 — End: 2020-06-01
  Administered 2020-06-01: 4 mg via INTRAVENOUS
  Filled 2020-06-01: qty 1

## 2020-06-01 MED ORDER — LACTATED RINGERS IV BOLUS
1000.0000 mL | Freq: Once | INTRAVENOUS | Status: AC
  Administered 2020-06-01: 1000 mL via INTRAVENOUS

## 2020-06-01 NOTE — ED Provider Notes (Signed)
Assumed care of patient approximately 2200.  Please compression for full details regarding patient's initial evaluation assessment.  In brief patient presents for assessment of some left lower quadrant abdominal pain that began last night.  She is currently pending labs and CT with concern for possible stone versus diverticulitis.  CBC is unremarkable.  CMP shows no significant electrolyte or metabolic derangements.  Lipase not consistent with acute pancreatitis.  Urinalysis with small hemoglobin but otherwise unremarkable and no evidence of infection.  CT consistent with uncomplicated diverticulitis with incidental findings including hepatic lesion, hiatal hernia, likely symptomatic right-sided kidney stone and aortic atherosclerosis.  Discussed these with patient recommendation for outpatient follow-up with her PCP.  Also advised that she should discuss with her gastroenterologist scheduling colonoscopy as recommended by radiology today follow-up inflammation seen on CT.  She was given a dose of Cipro and Flagyl in the emergency room and prescribed a short course of this that she is allergic to penicillins.  We will also write short course of Zofran and Norco.  Discharged stable condition.  Strict return precautions advised and discussed   Lucrezia Starch, MD 06/02/20 0040

## 2020-06-01 NOTE — ED Provider Notes (Signed)
[facility] Emergency Department Provider Note   ____________________________________________   Event Date/Time   First MD Initiated Contact with Patient 06/01/20 2213     (approximate)  I have reviewed the triage vital signs and the nursing notes.   HISTORY  Chief Complaint Abdominal Pain    HPI Olivia Dougherty is a 70 y.o. female with past medical history of hypertension, hyperlipidemia, CAD, and GERD who presents to the ED complaining of abdominal pain.  Patient reports that last night she developed pain in the left lower quadrant of her abdomen moving into her pelvic area.  Pain has been gradually worsening since then, is described as sharp and constant.  Her pain is not exacerbated or alleviated by anything in particular.  She denies any fevers, nausea, vomiting, diarrhea, dysuria, hematuria, or flank pain.  She has never had similar pain in the past, denies any history of diverticulitis.        Past Medical History:  Diagnosis Date  . Anxiety   . Arthritis    lower back  . Basal cell carcinoma 11/2011   face, left leg-using chemo cream on temple area  . Coronary artery disease 2005   LAD PCI with Cypher drug-eluting stent placement 3.0 x 13 mm with balloon angioplasty of diagonal.  Cardiac catheterization in 2010 showed patent stent with minimal restenosis.  Marland Kitchen GERD (gastroesophageal reflux disease)   . Heart disease   . Heart problem    98% blockage  . Hypercholesteremia   . Hypertension   . Hypothyroidism   . Kidney stone   . Osteoarthritis   . PONV (postoperative nausea and vomiting)    after 1 procedure approx 10 yrs ago  . Sleep apnea    mild, could not tol cpap  . Squamous cell carcinoma of skin    leg    Patient Active Problem List   Diagnosis Date Noted  . Insomnia 12/12/2019  . Polyarthralgia 12/10/2018  . Vitamin D deficiency 12/10/2018  . Personal history of colonic polyps   . Acute right-sided low back pain  without sciatica 12/02/2017  . Morbid obesity ([place]) 09/21/2017  . Essential hypertension 08/10/2017  . Hyperlipidemia 08/10/2017  . OSA (obstructive sleep apnea) 08/10/2017  . Allergic rhinitis 08/10/2017  . GAD (generalized anxiety disorder) 08/10/2017  . GERD (gastroesophageal reflux disease) 08/10/2017  . Special screening for malignant neoplasms, colon   . Benign neoplasm of ascending colon   . Benign neoplasm of descending colon   . Current tear knee, medial meniscus 05/25/2014  . Arthritis of knee, degenerative 05/25/2014  . Squamous cell carcinoma 01/13/2014  . History of basal cell carcinoma 01/13/2014  . Osteoarthritis 07/29/2011  . Coronary atherosclerosis of native coronary artery 01/01/2006  . Acquired hypothyroidism 01/01/2006    Past Surgical History:  Procedure Laterality Date  . ANGIOPLASTY  2005   CAD   98% blockage  . CARDIAC CATHETERIZATION  02/2008  . CATARACT EXTRACTION Right   . CATARACT EXTRACTION W/PHACO Right 08/31/2019   Procedure: CATARACT EXTRACTION PHACO AND INTRAOCULAR LENS PLACEMENT ([place]) RIGHT;  Surgeon: Eulogio Bear, MD;  Location: [place];  Service: Ophthalmology;  Laterality: Right;  4.41 0:30.7  . CATARACT EXTRACTION W/PHACO Left 09/26/2019   Procedure: CATARACT EXTRACTION PHACO AND INTRAOCULAR LENS PLACEMENT ([facility]) LEFT 1.60 00:20.3;  Surgeon: Eulogio Bear, MD;  Location: [place];  Service: Ophthalmology;  Laterality: Left;  . COLONOSCOPY WITH PROPOFOL N/A 12/29/2014   Procedure: COLONOSCOPY WITH PROPOFOL;  Surgeon:  Lucilla Lame, MD;  Location: [place];  Service: Endoscopy;  Laterality: N/A;  . COLONOSCOPY WITH PROPOFOL N/A 12/31/2017   Procedure: COLONOSCOPY WITH PROPOFOL;  Surgeon: Lucilla Lame, MD;  Location: [place];  Service: Endoscopy;  Laterality: N/A;  sleep apnea  . CORONARY ANGIOPLASTY  2005   LAD  . EYE SURGERY Right   . HYSTEROSCOPY  11/2001   resection of benign leiomyoma  .  KNEE ARTHROSCOPY Right 08/16/2014   Procedure: ARTHROSCOPY KNEE, partial lateral menisectomy and chondroplasty;  Surgeon: Dereck Leep, MD;  Location: [facility];  Service: Orthopedics;  Laterality: Right;  . POLYPECTOMY  12/29/2014   Procedure: POLYPECTOMY;  Surgeon: Lucilla Lame, MD;  Location: [place];  Service: Endoscopy;;  . SKIN CANCER EXCISION Right 02/2018   Right leg - Squamous   . THYROID CYST EXCISION Left 10/2002   left lobe thyroid excision  . THYROIDECTOMY, PARTIAL Left   . TUBAL LIGATION  1980   BTL  . UMBILICAL HERNIA REPAIR  1980  . 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 Authorizing Provider  aspirin 81 MG tablet Take 81 mg by mouth daily.    [provider]  augmented betamethasone dipropionate (DIPROLENE-AF) 0.05 % cream Apply 1 application topically as needed. For red bumps 09/01/19   [provider]  clopidogrel (PLAVIX) 75 MG tablet Take 1 tablet (75 mg total) by mouth daily with breakfast. 10/13/19   Wellington Hampshire, MD  cyclobenzaprine (FLEXERIL) 5 MG tablet TAKE ONE TABLET BY MOUTH THREE TIMES A DAY AS NEEDED FOR MUSCLE SPASMS 01/10/19   Bacigalupo, Dionne Bucy, MD  diclofenac Sodium (VOLTAREN) 1 % GEL Apply topically as needed. 12/17/18   [provider]  ezetimibe (ZETIA) 10 MG tablet Take 1 tablet (10 mg total) by mouth daily. 10/13/19 01/11/20  Wellington Hampshire, MD  levothyroxine (SYNTHROID) 75 MCG tablet Take 1 tablet (75 mcg total) by mouth daily before breakfast. 12/12/19   Bacigalupo, Dionne Bucy, MD  losartan (COZAAR) 50 MG tablet Take 2 tablets (100 mg total) by mouth daily. 10/13/19   Wellington Hampshire, MD  metoprolol tartrate (LOPRESSOR) 25 MG tablet Take 1 tablet (25 mg total) by mouth 2 (two) times daily. 10/13/19   Wellington Hampshire, MD  Multiple Vitamin (MULTIVITAMIN) capsule Take 1 capsule by mouth daily.    [provider]  Multiple  Vitamins-Minerals (MULTIVITAMIN PO) Take by mouth daily. Patient not taking: Reported on 12/12/2019    [provider]  rosuvastatin (CRESTOR) 10 MG tablet Take 1 tablet (10 mg total) by mouth daily. 10/13/19   Wellington Hampshire, MD  traZODone (DESYREL) 50 MG tablet Take 0.5-1 tablets (25-50 mg total) by mouth at bedtime as needed for sleep. 12/12/19   Virginia Crews, MD  Vitamin D, Ergocalciferol, (DRISDOL) 1.25 MG (50000 UNIT) CAPS capsule TAKE 1 CAPSULE BY MOUTH EVERY 14 DAYS 12/12/19   Bacigalupo, Dionne Bucy, MD    Allergies Atorvastatin, Codeine, Ace inhibitors, Enalapril, Penicillins, Sulfa antibiotics, and Adhesive [tape]  Family History  Problem Relation Age of Onset  . Hypertension Mother   . Hypertension Father   . Diabetes Father   . Heart disease Father   . Cervical cancer Sister   . Stroke Sister   . Colon cancer Neg Hx   . Breast cancer Neg Hx   . Ovarian cancer Neg Hx     Social  History Social History   Tobacco Use  . Smoking status: Never Smoker  . Smokeless tobacco: Never Used  Vaping Use  . Vaping Use: Never used  Substance Use Topics  . Alcohol use: Yes    Alcohol/week: 2.0 standard drinks    Types: 2 Glasses of wine per week    Comment: weekends  . Drug use: No    Review of Systems  Constitutional: No fever/chills Eyes: No visual changes. ENT: No sore throat. Cardiovascular: Denies chest pain. Respiratory: Denies shortness of breath. Gastrointestinal: Positive for abdominal pain.  No nausea, no vomiting.  No diarrhea.  No constipation. Genitourinary: Negative for dysuria. Musculoskeletal: Negative for back pain. Skin: Negative for rash. Neurological: Negative for headaches, focal weakness or numbness.  ____________________________________________   PHYSICAL EXAM:  VITAL SIGNS: ED Triage Vitals  Enc Vitals Group     BP 06/01/20 2147 (!) 182/88     Pulse Rate 06/01/20 2147 100     Resp 06/01/20 2147 20     Temp 06/01/20 2151  98.9 F (37.2 C)     Temp Source 06/01/20 2151 Oral     SpO2 06/01/20 2147 99 %     Weight 06/01/20 2148 215 lb (97.5 kg)     Height 06/01/20 2148 5\' 2"  (1.575 m)     Head Circumference --      [place] Flow --      Pain Score 06/01/20 2148 10     Pain Loc --      Pain Edu? --      Excl. in [place]? --     Constitutional: Alert and oriented. Eyes: Conjunctivae are normal. Head: Atraumatic. Nose: No congestion/rhinnorhea. Mouth/Throat: Mucous membranes are moist. Neck: Normal ROM Cardiovascular: Normal rate, regular rhythm. Grossly normal heart sounds. Respiratory: Normal respiratory effort.  No retractions. Lungs CTAB. Gastrointestinal: Soft and tender to palpation in the left lower quadrant with no rebound or guarding.  No CVA tenderness bilaterally. No distention. Genitourinary: deferred Musculoskeletal: No lower extremity tenderness nor edema. Neurologic:  Normal speech and language. No gross focal neurologic deficits are appreciated. Skin:  Skin is warm, dry and intact. No rash noted. Psychiatric: Mood and affect are normal. Speech and behavior are normal.  ____________________________________________   LABS (all labs ordered are listed, but only abnormal results are displayed)  Labs Reviewed  COMPREHENSIVE METABOLIC PANEL - Abnormal; Notable for the following components:      Result Value   Glucose, Bld 108 (*)    All other components within normal limits  URINALYSIS, COMPLETE (UACMP) WITH MICROSCOPIC - Abnormal; Notable for the following components:   Color, Urine STRAW (*)    APPearance CLEAR (*)    Hgb urine dipstick SMALL (*)    All other components within normal limits  LIPASE, BLOOD  CBC    PROCEDURES  Procedure(s) performed (including Critical Care):  Procedures   ____________________________________________   INITIAL IMPRESSION / ASSESSMENT AND PLAN / ED COURSE       70 year old female with past medical history of hypertension, hyperlipidemia, CAD,  and GERD who presents to the ED with gradually worsening pain in the left lower quadrant of her abdomen since yesterday evening.  Pain is reproducible with palpation of her left lower quadrant and we will further assess with CT scan for diverticulitis.  UA shows no signs of infection, labs are pending at this time.  We will treat symptomatically with IV morphine and Zofran, hydrate with IV fluids.  Lab work is unremarkable, patient is  pending CT results at this time.  Patient turned over to oncoming provider pending CT results and reassessment.      ____________________________________________   FINAL CLINICAL IMPRESSION(S) / ED DIAGNOSES  Final diagnoses:  LLQ abdominal pain     ED Discharge Orders    None       Note:  This document was prepared using Dragon voice recognition software and may include unintentional dictation errors.   Blake Divine, MD 06/01/20 2351

## 2020-06-01 NOTE — ED Triage Notes (Addendum)
Pt reports LLQ/pelvic pain since last night. States she has been seen by a gynecologist for a left sided ovarian cyst but was told it's "not one to worry about." Denies NVD or vaginal bleeding, reports some constipation over last few weeks (last BM today). Reports urinary frequency today, denies dysuria.

## 2020-06-02 DIAGNOSIS — R1032 Left lower quadrant pain: Secondary | ICD-10-CM | POA: Diagnosis not present

## 2020-06-02 MED ORDER — CIPROFLOXACIN HCL 500 MG PO TABS
500.0000 mg | ORAL_TABLET | Freq: Once | ORAL | Status: AC
  Administered 2020-06-02: 500 mg via ORAL
  Filled 2020-06-02: qty 1

## 2020-06-02 MED ORDER — METRONIDAZOLE 500 MG PO TABS
500.0000 mg | ORAL_TABLET | Freq: Once | ORAL | Status: AC
  Administered 2020-06-02: 500 mg via ORAL
  Filled 2020-06-02: qty 1

## 2020-06-02 MED ORDER — HYDROCODONE-ACETAMINOPHEN 5-325 MG PO TABS: 2.0000 | ORAL_TABLET | Freq: Four times a day (QID) | ORAL | 0 refills | Status: AC | PRN

## 2020-06-02 MED ORDER — CIPROFLOXACIN HCL 500 MG PO TABS: 500.0000 mg | ORAL_TABLET | Freq: Two times a day (BID) | ORAL | 0 refills | Status: DC

## 2020-06-02 MED ORDER — METRONIDAZOLE 500 MG PO TABS: 500.0000 mg | ORAL_TABLET | Freq: Two times a day (BID) | ORAL | 0 refills | Status: DC

## 2020-06-02 MED ORDER — ONDANSETRON HCL 4 MG PO TABS: 4.0000 mg | ORAL_TABLET | Freq: Three times a day (TID) | ORAL | 0 refills | Status: DC | PRN

## 2020-06-02 NOTE — Discharge Instructions (Signed)
Your Ct today showed: 1. Colonic diverticulosis with uncomplicated distal descending colon diverticulitis. Recommend colonoscopy status post treatment and status post complete resolution of inflammatory changes to exclude an underlying lesion. 2. Other imaging findings of potential clinical significance: Small hiatal hernia. Indeterminate subcentimeter left hepatic lobe hypodensity. Nonobstructive 3 mm right nephrolithiasis. Aortic Atherosclerosis (ICD10-I70.0).

## 2020-06-04 ENCOUNTER — Telehealth: Payer: Self-pay

## 2020-06-04 NOTE — Telephone Encounter (Signed)
Patient was seen in the ER over the weekend and diagnosed with Diverticulitis. Would like Dr. Allen Norris to look over the findings and consult with her. Has a New patient appt on June 8th. Wants to know if she needs to be seen quicker? Says pain is mild today and she has antibiotics.

## 2020-06-05 NOTE — Telephone Encounter (Signed)
Returned pt's call regarding her recent ER visit. Advised pt I will put her on our wait list for a sooner appt. Pt stated she is doing okay today. She did have a little bit of discomfort yesterday. Advised pt to continue with the antibiotics and to call me back if she starts to develop bloody diarrhea, fever, nausea, vomiting or abdominal pain after she completes her course.

## 2020-06-08 ENCOUNTER — Ambulatory Visit (HOSPITAL_BASED_OUTPATIENT_CLINIC_OR_DEPARTMENT_OTHER): Payer: Medicare Other | Admitting: Obstetrics & Gynecology

## 2020-06-08 ENCOUNTER — Other Ambulatory Visit: Payer: Self-pay

## 2020-06-08 MED ORDER — METRONIDAZOLE 500 MG PO TABS: 500.0000 mg | ORAL_TABLET | Freq: Two times a day (BID) | ORAL | 0 refills | Status: DC

## 2020-06-08 MED ORDER — CIPROFLOXACIN HCL 500 MG PO TABS: 500.0000 mg | ORAL_TABLET | Freq: Two times a day (BID) | ORAL | 0 refills | Status: DC

## 2020-06-11 ENCOUNTER — Ambulatory Visit (INDEPENDENT_AMBULATORY_CARE_PROVIDER_SITE_OTHER): Payer: Medicare Other | Admitting: Family Medicine

## 2020-06-11 ENCOUNTER — Encounter: Payer: Self-pay | Admitting: Family Medicine

## 2020-06-11 ENCOUNTER — Other Ambulatory Visit: Payer: Self-pay

## 2020-06-11 VITALS — BP 136/63 | HR 64 | Temp 98.3°F | Resp 16 | Wt 217.1 lb

## 2020-06-11 DIAGNOSIS — I700 Atherosclerosis of aorta: Secondary | ICD-10-CM | POA: Diagnosis not present

## 2020-06-11 DIAGNOSIS — I10 Essential (primary) hypertension: Secondary | ICD-10-CM | POA: Diagnosis not present

## 2020-06-11 DIAGNOSIS — E039 Hypothyroidism, unspecified: Secondary | ICD-10-CM

## 2020-06-11 DIAGNOSIS — K9089 Other intestinal malabsorption: Secondary | ICD-10-CM | POA: Diagnosis not present

## 2020-06-11 DIAGNOSIS — E7800 Pure hypercholesterolemia, unspecified: Secondary | ICD-10-CM | POA: Diagnosis not present

## 2020-06-11 DIAGNOSIS — E559 Vitamin D deficiency, unspecified: Secondary | ICD-10-CM

## 2020-06-11 DIAGNOSIS — R5382 Chronic fatigue, unspecified: Secondary | ICD-10-CM

## 2020-06-11 NOTE — Assessment & Plan Note (Signed)
Continue risk factor management. 

## 2020-06-11 NOTE — Progress Notes (Signed)
Established patient visit   Patient: Olivia Dougherty   DOB: September 11, 1950   70 y.o. Female  MRN: [id number] Visit Date: 06/11/2020  Today's healthcare provider: [name], MD   Chief Complaint  Patient presents with  . Hypertension  . Hyperlipidemia  . Hypothyroidism  . Vitamin D deficiency  . Insomnia   Subjective    Hypertension Pertinent negatives include no chest pain, headaches or shortness of breath.  Hyperlipidemia Pertinent negatives include no chest pain or shortness of breath.  Insomnia    Hypertension, follow-up  BP Readings from Last 3 Encounters:  06/11/20 136/63  06/02/20 (!) 120/57  12/12/19 (!) 109/56   Wt Readings from Last 3 Encounters:  06/11/20 217 lb 1.6 oz (98.5 kg)  06/01/20 215 lb (97.5 kg)  12/12/19 230 lb (104.3 kg)     She was last seen for hypertension 6 months ago.  BP at that visit was 109/59. Management since that visit includes none.  She reports excellent compliance with treatment. She is not having side effects.  She is following a Regular diet. She is exercising. She does not smoke.  Use of agents associated with hypertension: NSAIDS.   Outside blood pressures are 102-725 systolic, 78 diastolic. Symptoms: No chest pain No chest pressure  No palpitations No syncope  No dyspnea No orthopnea  No paroxysmal nocturnal dyspnea No lower extremity edema   Pertinent labs: Lab Results  Component Value Date   CHOL 159 12/12/2019   HDL 64 12/12/2019   LDLCALC 77 12/12/2019   TRIG 102 12/12/2019   CHOLHDL 2.5 12/12/2019   Lab Results  Component Value Date   NA 140 06/01/2020   K 3.9 06/01/2020   CREATININE 0.82 06/01/2020   GFRNONAA >60 06/01/2020   GFRAA 106 12/12/2019   GLUCOSE 108 (H) 06/01/2020     The 10-year ASCVD risk score Mikey Bussing DC Jr., et al., 2013) is: 8.9%   --------------------------------------------------------------------------------------------------- Lipid/Cholesterol,  Follow-up  Last lipid panel Other pertinent labs  Lab Results  Component Value Date   CHOL 159 12/12/2019   HDL 64 12/12/2019   LDLCALC 77 12/12/2019   TRIG 102 12/12/2019   CHOLHDL 2.5 12/12/2019   Lab Results  Component Value Date   ALT 21 06/01/2020   AST 29 06/01/2020   PLT 209 06/01/2020   TSH 0.762 12/12/2019     She was last seen for this 6 months ago.  Management since that visit includes none.  She reports excellent compliance with treatment. She is not having side effects.  Symptoms: No chest pain No chest pressure/discomfort  No dyspnea No lower extremity edema  No numbness or tingling of extremity No orthopnea  No palpitations No paroxysmal nocturnal dyspnea  No speech difficulty No syncope   Current diet: well balanced Current exercise: patient reports that she works out weekly  The 10-year ASCVD risk score Mikey Bussing DC Jr., et al., 2013) is: 8.9%  --------------------------------------------------------------------------------------------------- Hypothyroid, follow-up  Lab Results  Component Value Date   TSH 0.762 12/12/2019   TSH 1.460 06/09/2019   TSH 1.310 12/13/2018   Wt Readings from Last 3 Encounters:  06/11/20 217 lb 1.6 oz (98.5 kg)  06/01/20 215 lb (97.5 kg)  12/12/19 230 lb (104.3 kg)    She was last seen for hypothyroid 6 months ago.  Management since that visit includes none. She reports excellent compliance with treatment. She is not having side effects.  Symptoms: Yes change in energy level No constipation  No diarrhea  No heat / cold intolerance  No nervousness No palpitations  No weight changes    ----------------------------------------------------------------------------------------- Follow up for Vitamin D deficiency  The patient was last seen for this 6 months ago. Changes made at last visit include none, continue high dose supplement.  She reports good compliance with treatment. Patient reports taking every 2 weeks She  feels that condition is Unchanged. She is not having side effects.   ----------------------------------------------------------------------------------------- Follow up for Insomia  The patient was last seen for this 6 months ago. Changes made at last visit include .  She reports poor compliance with treatment. She feels that condition is Unchanged. She is not having side effects.   -----------------------------------------------------------------------------------------   Patient Active Problem List   Diagnosis Date Noted  . Aortic atherosclerosis ([place]) 06/11/2020  . Insomnia 12/12/2019  . Polyarthralgia 12/10/2018  . Vitamin D deficiency 12/10/2018  . Personal history of colonic polyps   . Acute right-sided low back pain without sciatica 12/02/2017  . Morbid obesity ([place]) 09/21/2017  . Essential hypertension 08/10/2017  . Hyperlipidemia 08/10/2017  . OSA (obstructive sleep apnea) 08/10/2017  . Allergic rhinitis 08/10/2017  . GAD (generalized anxiety disorder) 08/10/2017  . GERD (gastroesophageal reflux disease) 08/10/2017  . Special screening for malignant neoplasms, colon   . Benign neoplasm of ascending colon   . Benign neoplasm of descending colon   . Current tear knee, medial meniscus 05/25/2014  . Arthritis of knee, degenerative 05/25/2014  . Squamous cell carcinoma 01/13/2014  . History of basal cell carcinoma 01/13/2014  . Osteoarthritis 07/29/2011  . Coronary atherosclerosis of native coronary artery 01/01/2006  . Acquired hypothyroidism 01/01/2006   Past Medical History:  Diagnosis Date  . Anxiety   . Arthritis    lower back  . Basal cell carcinoma 11/2011   face, left leg-using chemo cream on temple area  . Coronary artery disease 2005   LAD PCI with Cypher drug-eluting stent placement 3.0 x 13 mm with balloon angioplasty of diagonal.  Cardiac catheterization in 2010 showed patent stent with minimal restenosis.  Marland Kitchen GERD (gastroesophageal reflux disease)    . Heart disease   . Heart problem    98% blockage  . Hypercholesteremia   . Hypertension   . Hypothyroidism   . Kidney stone   . Osteoarthritis   . PONV (postoperative nausea and vomiting)    after 1 procedure approx 10 yrs ago  . Sleep apnea    mild, could not tol cpap  . Squamous cell carcinoma of skin    leg   Allergies  Allergen Reactions  . Atorvastatin Other (See Comments)    Other reaction(s): myalgia  . Codeine Shortness Of Breath  . Ace Inhibitors Cough       . Enalapril Cough  . Penicillins Hives       . Sulfa Antibiotics Hives and Swelling       . Adhesive [Tape] Rash            Medications: Outpatient Medications Prior to Visit  Medication Sig  . aspirin 81 MG tablet Take 81 mg by mouth daily.  Marland Kitchen augmented betamethasone dipropionate (DIPROLENE-AF) 0.05 % cream Apply 1 application topically as needed. For red bumps  . clopidogrel (PLAVIX) 75 MG tablet Take 1 tablet (75 mg total) by mouth daily with breakfast.  . cyclobenzaprine (FLEXERIL) 5 MG tablet TAKE ONE TABLET BY MOUTH THREE TIMES A DAY AS NEEDED FOR MUSCLE SPASMS  . diclofenac Sodium (VOLTAREN) 1 % GEL Apply topically as needed.  Marland Kitchen  levothyroxine (SYNTHROID) 75 MCG tablet Take 1 tablet (75 mcg total) by mouth daily before breakfast.  . losartan (COZAAR) 50 MG tablet Take 2 tablets (100 mg total) by mouth daily.  . metoprolol tartrate (LOPRESSOR) 25 MG tablet Take 1 tablet (25 mg total) by mouth 2 (two) times daily.  . Multiple Vitamin (MULTIVITAMIN) capsule Take 1 capsule by mouth daily.  . Multiple Vitamins-Minerals (MULTIVITAMIN PO) Take by mouth daily.  . rosuvastatin (CRESTOR) 10 MG tablet Take 1 tablet (10 mg total) by mouth daily.  . traZODone (DESYREL) 50 MG tablet Take 0.5-1 tablets (25-50 mg total) by mouth at bedtime as needed for sleep.  . Vitamin D, Ergocalciferol, (DRISDOL) 1.25 MG (50000 UNIT) CAPS capsule TAKE 1 CAPSULE BY MOUTH EVERY 14 DAYS  . [DISCONTINUED] ciprofloxacin (CIPRO) 500  MG tablet Take 1 tablet (500 mg total) by mouth 2 (two) times daily for 7 days.  Marland Kitchen ezetimibe (ZETIA) 10 MG tablet Take 1 tablet (10 mg total) by mouth daily.  . [DISCONTINUED] metroNIDAZOLE (FLAGYL) 500 MG tablet Take 1 tablet (500 mg total) by mouth 2 (two) times daily for 7 days. (Patient not taking: Reported on 06/11/2020)  . [DISCONTINUED] ondansetron (ZOFRAN) 4 MG tablet Take 1 tablet (4 mg total) by mouth every 8 (eight) hours as needed for up to 10 doses for nausea or vomiting. (Patient not taking: Reported on 06/11/2020)   No facility-administered medications prior to visit.    Review of Systems  Constitutional: Negative for chills, fatigue and fever.  HENT: Negative for congestion, ear pain, rhinorrhea, sinus pain and sore throat.   Respiratory: Negative for cough, shortness of breath and wheezing.   Cardiovascular: Negative for chest pain and leg swelling.  Gastrointestinal: Negative for abdominal pain, blood in stool, diarrhea, nausea and vomiting.  Genitourinary: Negative for dysuria, flank pain, frequency and urgency.  Neurological: Negative for dizziness and headaches.  Psychiatric/Behavioral: The patient has insomnia.        Objective    BP 136/63   Pulse 64   Temp 98.3 F (36.8 C) (Oral)   Resp 16   Wt 217 lb 1.6 oz (98.5 kg)   LMP 01/28/2007 (Approximate)   SpO2 98%   BMI 39.71 kg/m     Physical Exam Vitals reviewed.  Constitutional:      General: She is not in acute distress.    Appearance: Normal appearance. She is well-developed. She is not diaphoretic.  HENT:     Head: Normocephalic and atraumatic.     Mouth/Throat:     Mouth: Mucous membranes are dry.     Pharynx: Oropharynx is clear. No oropharyngeal exudate.  Eyes:     General: No scleral icterus.    Conjunctiva/sclera: Conjunctivae normal.  Neck:     Thyroid: No thyromegaly.  Cardiovascular:     Rate and Rhythm: Normal rate and regular rhythm.     Pulses: Normal pulses.     Heart sounds:  Normal heart sounds. No murmur heard.   Pulmonary:     Effort: Pulmonary effort is normal. No respiratory distress.     Breath sounds: Normal breath sounds. No wheezing, rhonchi or rales.  Abdominal:     Tenderness: There is abdominal tenderness (Left Lower Quadrant).  Musculoskeletal:     Cervical back: Neck supple.     Right lower leg: No edema.     Left lower leg: No edema.  Lymphadenopathy:     Cervical: No cervical adenopathy.  Skin:    General: Skin is warm  and dry.     Findings: No rash.  Neurological:     Mental Status: She is alert and oriented to person, place, and time. Mental status is at baseline.  Psychiatric:        Mood and Affect: Mood normal.        Behavior: Behavior normal.     No results found for any visits on 06/11/20.  Assessment & Plan     Problem List Items Addressed This Visit      Cardiovascular and Mediastinum   Essential hypertension - Primary    Well controlled Continue current medications Review recent metabolic panel F/u in 6 months       Aortic atherosclerosis ([place])    Continue risk factor management        Endocrine   Acquired hypothyroidism    Previously well controlled Continue Synthroid at current dose  Recheck TSH and adjust Synthroid as indicated       Relevant Orders   TSH     Other   Hyperlipidemia    Previously well controlled Continue statin and zetia Repeat FLP and CMP      Relevant Orders   Lipid panel   Morbid obesity ([place])    BMI >39 and associated with HTN, HLD, OSA Discussed importance of healthy weight management Discussed diet and exercise       Vitamin D deficiency    Continue supplement Recheck level      Relevant Orders   VITAMIN D 25 Hydroxy (Vit-D Deficiency, Fractures)    Other Visit Diagnoses    Chronic fatigue       Relevant Orders   VITAMIN D 25 Hydroxy (Vit-D Deficiency, Fractures)   B12   Other intestinal malabsorption        Relevant Orders   B12       Return in  about 6 months (around 12/12/2020) for AWV, chronic disease f/u.      Frederic Jericho [place],acting as a Education administrator for [name], MD.,have documented all relevant documentation on the behalf of [name], MD,as directed by  [name], MD while in the presence of [name], MD.   I, [name], MD, have reviewed all documentation for this visit. The documentation on 06/11/20 for the exam, diagnosis, procedures, and orders are all accurate and complete.   Nashon Erbes, Dionne Bucy, MD, MPH [facility]

## 2020-06-11 NOTE — Assessment & Plan Note (Signed)
Previously well controlled Continue statin and zetia Repeat FLP and CMP 

## 2020-06-11 NOTE — Assessment & Plan Note (Signed)
Well controlled Continue current medications Review recent metabolic panel F/u in 6 months

## 2020-06-11 NOTE — Assessment & Plan Note (Signed)
Previously well controlled Continue Synthroid at current dose  Recheck TSH and adjust Synthroid as indicated   

## 2020-06-11 NOTE — Assessment & Plan Note (Signed)
BMI >39 and associated with HTN, HLD, OSA Discussed importance of healthy weight management Discussed diet and exercise

## 2020-06-11 NOTE — Assessment & Plan Note (Signed)
Continue supplement Recheck level 

## 2020-06-12 DIAGNOSIS — R5382 Chronic fatigue, unspecified: Secondary | ICD-10-CM | POA: Diagnosis not present

## 2020-06-12 DIAGNOSIS — E7800 Pure hypercholesterolemia, unspecified: Secondary | ICD-10-CM | POA: Diagnosis not present

## 2020-06-12 DIAGNOSIS — E559 Vitamin D deficiency, unspecified: Secondary | ICD-10-CM | POA: Diagnosis not present

## 2020-06-12 DIAGNOSIS — E039 Hypothyroidism, unspecified: Secondary | ICD-10-CM | POA: Diagnosis not present

## 2020-06-12 DIAGNOSIS — K9089 Other intestinal malabsorption: Secondary | ICD-10-CM | POA: Diagnosis not present

## 2020-06-13 LAB — LIPID PANEL
Chol/HDL Ratio: 2.1 ratio (ref 0.0–4.4)
Cholesterol, Total: 115 mg/dL (ref 100–199)
HDL: 56 mg/dL (ref >39)
LDL Chol Calc (NIH): 43 mg/dL (ref 0–99)
Triglycerides: 80 mg/dL (ref 0–149)
VLDL Cholesterol Cal: 16 mg/dL (ref 5–40)

## 2020-06-13 LAB — TSH: TSH: 3.42 u[IU]/mL (ref 0.450–4.500)

## 2020-06-13 LAB — VITAMIN D 25 HYDROXY (VIT D DEFICIENCY, FRACTURES): Vit D, 25-Hydroxy: 51.6 ng/mL (ref 30.0–100.0)

## 2020-06-13 LAB — VITAMIN B12: Vitamin B-12: 486 pg/mL (ref 232–1245)

## 2020-06-27 DIAGNOSIS — D2262 Melanocytic nevi of left upper limb, including shoulder: Secondary | ICD-10-CM | POA: Diagnosis not present

## 2020-06-27 DIAGNOSIS — D485 Neoplasm of uncertain behavior of skin: Secondary | ICD-10-CM | POA: Diagnosis not present

## 2020-06-27 DIAGNOSIS — Z85828 Personal history of other malignant neoplasm of skin: Secondary | ICD-10-CM | POA: Diagnosis not present

## 2020-06-27 DIAGNOSIS — D2261 Melanocytic nevi of right upper limb, including shoulder: Secondary | ICD-10-CM | POA: Diagnosis not present

## 2020-06-27 DIAGNOSIS — D225 Melanocytic nevi of trunk: Secondary | ICD-10-CM | POA: Diagnosis not present

## 2020-06-27 DIAGNOSIS — X32XXXA Exposure to sunlight, initial encounter: Secondary | ICD-10-CM | POA: Diagnosis not present

## 2020-06-27 DIAGNOSIS — L570 Actinic keratosis: Secondary | ICD-10-CM | POA: Diagnosis not present

## 2020-07-02 ENCOUNTER — Telehealth: Payer: Self-pay

## 2020-07-02 NOTE — Telephone Encounter (Signed)
Patient wanting to know if any other lab can be ordered or other recommendations from Dr. Brita Romp. Patient advised that Dr. B is off for the next two weeks. Patient will wait on dr. B.

## 2020-07-02 NOTE — Telephone Encounter (Signed)
Copied from [place] [id number]. Topic: General - Other >> Jul 02, 2020 10:32 AM Celene Kras wrote: Reason for CRM: Pt called in regards to her blood work. She states that she would like to speak with someone regarding her fatigue and if she should be getting a b12 shot. Please advise.

## 2020-07-04 ENCOUNTER — Ambulatory Visit (INDEPENDENT_AMBULATORY_CARE_PROVIDER_SITE_OTHER): Payer: Medicare Other | Admitting: Gastroenterology

## 2020-07-04 ENCOUNTER — Other Ambulatory Visit: Payer: Self-pay

## 2020-07-04 ENCOUNTER — Encounter: Payer: Self-pay | Admitting: Gastroenterology

## 2020-07-04 VITALS — BP 137/81 | HR 71 | Temp 98.0°F | Ht 62.0 in | Wt 215.2 lb

## 2020-07-04 DIAGNOSIS — R1032 Left lower quadrant pain: Secondary | ICD-10-CM

## 2020-07-04 DIAGNOSIS — K5900 Constipation, unspecified: Secondary | ICD-10-CM | POA: Diagnosis not present

## 2020-07-04 NOTE — Progress Notes (Signed)
Primary Care Physician: Virginia Crews, MD  Primary Gastroenterologist:  Dr. Lucilla Lame  Chief Complaint  Patient presents with  . Constipation    Bloating, left quadrant pain    HPI: Olivia Dougherty is a 70 y.o. female here with a report of bloating constipation and left side abdominal pain.  The patient had a colonoscopy by me back in 2016 and 2019 with a history of colon polyps.  The patient was in the emergency department at the beginning of May and had a CT scan for abdominal pain that showed:  IMPRESSION: 1. Colonic diverticulosis with uncomplicated distal descending colon diverticulitis. Recommend colonoscopy status post treatment and status post complete resolution of inflammatory changes to exclude an underlying lesion. 2. Other imaging findings of potential clinical significance: Small hiatal hernia. Indeterminate subcentimeter left hepatic lobe hypodensity. Nonobstructive 3 mm right nephrolithiasis. Aortic Atherosclerosis (ICD10-I70.0).  The CT scan showed diverticulitis with a kidney stone, hiatal hernia and it was recommended the patient undergo a colonoscopy after resolution of her diverticulitis to rule out an underlying lesion.  The patient reports that the pain is not as bad as it was before but she still has lower abdominal pain with bloating.  There is no report of any black stools or bloody stools.  Past Medical History:  Diagnosis Date  . Anxiety   . Arthritis    lower back  . Basal cell carcinoma 11/2011   face, left leg-using chemo cream on temple area  . Coronary artery disease 2005   LAD PCI with Cypher drug-eluting stent placement 3.0 x 13 mm with balloon angioplasty of diagonal.  Cardiac catheterization in 2010 showed patent stent with minimal restenosis.  Marland Kitchen GERD (gastroesophageal reflux disease)   . Heart disease   . Heart problem    98% blockage  . Hypercholesteremia   . Hypertension   . Hypothyroidism   . Kidney stone   .  Osteoarthritis   . PONV (postoperative nausea and vomiting)    after 1 procedure approx 10 yrs ago  . Sleep apnea    mild, could not tol cpap  . Squamous cell carcinoma of skin    leg    Current Outpatient Medications  Medication Sig Dispense Refill  . aspirin 81 MG tablet Take 81 mg by mouth daily.    . clopidogrel (PLAVIX) 75 MG tablet Take 1 tablet (75 mg total) by mouth daily with breakfast. 90 tablet 3  . cyclobenzaprine (FLEXERIL) 5 MG tablet TAKE ONE TABLET BY MOUTH THREE TIMES A DAY AS NEEDED FOR MUSCLE SPASMS 30 tablet 0  . levothyroxine (SYNTHROID) 75 MCG tablet Take 1 tablet (75 mcg total) by mouth daily before breakfast. 90 tablet 1  . losartan (COZAAR) 50 MG tablet Take 2 tablets (100 mg total) by mouth daily. 180 tablet 3  . metoprolol tartrate (LOPRESSOR) 25 MG tablet Take 1 tablet (25 mg total) by mouth 2 (two) times daily. 180 tablet 3  . Multiple Vitamins-Minerals (MULTIVITAMIN PO) Take by mouth daily.    . rosuvastatin (CRESTOR) 10 MG tablet Take 1 tablet (10 mg total) by mouth daily. 90 tablet 3  . Vitamin D, Ergocalciferol, (DRISDOL) 1.25 MG (50000 UNIT) CAPS capsule TAKE 1 CAPSULE BY MOUTH EVERY 14 DAYS 6 capsule 3  . augmented betamethasone dipropionate (DIPROLENE-AF) 0.05 % cream Apply 1 application topically as needed. For red bumps (Patient not taking: Reported on 07/04/2020)    . diclofenac Sodium (VOLTAREN) 1 % GEL Apply topically as needed. (Patient  not taking: Reported on 07/04/2020)    . ezetimibe (ZETIA) 10 MG tablet Take 1 tablet (10 mg total) by mouth daily. 90 tablet 3  . traZODone (DESYREL) 50 MG tablet Take 0.5-1 tablets (25-50 mg total) by mouth at bedtime as needed for sleep. (Patient not taking: Reported on 07/04/2020) 30 tablet 5   No current facility-administered medications for this visit.    Allergies as of 07/04/2020 - Review Complete 07/04/2020  Allergen Reaction Noted  . Atorvastatin Other (See Comments) 12/04/2014  . Codeine Shortness Of Breath  08/10/2014  . Ace inhibitors Cough 12/04/2014  . Enalapril Cough 12/04/2014  . Penicillins Hives 12/14/2012  . Sulfa antibiotics Hives and Swelling 12/14/2012  . Adhesive [tape] Rash 12/14/2012    ROS:  General: Negative for anorexia, weight loss, fever, chills, fatigue, weakness. ENT: Negative for hoarseness, difficulty swallowing , nasal congestion. CV: Negative for chest pain, angina, palpitations, dyspnea on exertion, peripheral edema.  Respiratory: Negative for dyspnea at rest, dyspnea on exertion, cough, sputum, wheezing.  GI: See history of present illness. GU:  Negative for dysuria, hematuria, urinary incontinence, urinary frequency, nocturnal urination.  Endo: Negative for unusual weight change.    Physical Examination:   BP 137/81   Pulse 71   Temp 98 F (36.7 C) (Temporal)   Ht 5\' 2"  (1.575 m)   Wt 215 lb 3.2 oz (97.6 kg)   LMP 01/28/2007 (Approximate)   BMI 39.36 kg/m   General: Well-nourished, well-developed in no acute distress.  Eyes: No icterus. Conjunctivae pink. Lungs: Clear to auscultation bilaterally. Non-labored. Heart: Regular rate and rhythm, no murmurs rubs or gallops.  Abdomen: Bowel sounds are normal, positive tenderness in the left lower quadrant, nondistended, no hepatosplenomegaly or masses, no abdominal bruits or hernia , no rebound or guarding.   Extremities: No lower extremity edema. No clubbing or deformities. Neuro: Alert and oriented x 3.  Grossly intact. Skin: Warm and dry, no jaundice.   Psych: Alert and cooperative, normal mood and affect.  Labs:    Imaging Studies: No results found.  Assessment and Plan:   Olivia Dougherty is a 70 y.o. y/o female who comes in today with lower abdominal pain but she states is better than when she was diagnosed with diverticulitis last month.  She still has bloating and left-sided pain.  She will be started on a trial of Linzess to see if her pain resolves.  If the pain does resolve she will  let me know and we will set her up for a colonoscopy due to her recent attack of diverticulitis and her history of polyps.  The patient has been told that if the symptoms do not improve after moving her bowels and evacuating her colon that she will need a repeat CT scan to make sure that her diverticulitis has resolved.  She did not have any fevers or chills with the original diagnosis of diverticulitis and denies any fevers or chills at the present time.  The patient has been explained the plan and agrees with it.     Lucilla Lame, MD. Marval Regal    Note: This dictation was prepared with Dragon dictation along with smaller phrase technology. Any transcriptional errors that result from this process are unintentional.

## 2020-07-08 ENCOUNTER — Other Ambulatory Visit: Payer: Self-pay | Admitting: Family Medicine

## 2020-07-08 NOTE — Telephone Encounter (Signed)
Requested Prescriptions  Pending Prescriptions Disp Refills  . levothyroxine (SYNTHROID) 75 MCG tablet [Pharmacy Med Name: LEVOTHYROXINE 75 MCG TABLET] 90 tablet 3    Sig: TAKE ONE TABLET BY MOUTH DAILY BEFORE BREAKFAST     Endocrinology:  Hypothyroid Agents Failed - 07/08/2020 10:28 AM      Failed - TSH needs to be rechecked within 3 months after an abnormal result. Refill until TSH is due.      Passed - TSH in normal range and within 360 days    TSH  Date Value Ref Range Status  06/12/2020 3.420 0.450 - 4.500 uIU/mL Final         Passed - Valid encounter within last 12 months    Recent Outpatient Visits          3 weeks ago Essential hypertension   [place], Dionne Bucy, MD   6 months ago Essential hypertension   [place], Dionne Bucy, MD   6 months ago Encounter for [employer] annual wellness exam   [facility], [place], LPN   11 months ago [place], Vickki Muff, PA-C   1 year ago Essential hypertension   [place], Dionne Bucy, MD      Future Appointments            In 3 months Fletcher Anon, Mertie Clause, MD [id number]

## 2020-07-13 ENCOUNTER — Other Ambulatory Visit: Payer: Self-pay

## 2020-07-13 ENCOUNTER — Ambulatory Visit: Payer: Self-pay | Admitting: *Deleted

## 2020-07-13 ENCOUNTER — Telehealth: Payer: Medicare Other | Admitting: Emergency Medicine

## 2020-07-13 DIAGNOSIS — U071 COVID-19: Secondary | ICD-10-CM

## 2020-07-13 MED ORDER — NIRMATRELVIR/RITONAVIR (PAXLOVID)TABLET
3.0000 | ORAL_TABLET | Freq: Two times a day (BID) | ORAL | 0 refills | Status: AC
  Filled 2020-07-13: qty 30, 5d supply, fill #0

## 2020-07-13 MED ORDER — BENZONATATE 100 MG PO CAPS
100.0000 mg | ORAL_CAPSULE | Freq: Two times a day (BID) | ORAL | 0 refills | Status: DC | PRN
  Filled 2020-07-13: qty 20, 10d supply, fill #0

## 2020-07-13 NOTE — Patient Instructions (Signed)
[facility] [address] [place], [place], [place] [address]

## 2020-07-13 NOTE — Progress Notes (Signed)
Virtual Visit Consent   Olivia Dougherty, you are scheduled for a virtual visit with a [facility] provider today.     Just as with appointments in the office, your consent must be obtained to participate.  Your consent will be active for this visit and any virtual visit you may have with one of our providers in the next 365 days.     If you have a MyChart account, a copy of this consent can be sent to you electronically.  All virtual visits are billed to your insurance company just like a traditional visit in the office.    As this is a virtual visit, video technology does not allow for your provider to perform a traditional examination.  This may limit your provider's ability to fully assess your condition.  If your provider identifies any concerns that need to be evaluated in person or the need to arrange testing (such as labs, EKG, etc.), we will make arrangements to do so.     Although advances in technology are sophisticated, we cannot ensure that it will always work on either your end or our end.  If the connection with a video visit is poor, the visit may have to be switched to a telephone visit.  With either a video or telephone visit, we are not always able to ensure that we have a secure connection.     I need to obtain your verbal consent now.   Are you willing to proceed with your visit today?    Olivia Dougherty has provided verbal consent on 07/13/2020 for a virtual visit video.   Montine Circle, PA-C   Date: 07/13/2020 8:44 AM   Virtual Visit via Video Note   I, Montine Circle, connected [id number]@ ([id number], 1950/09/01) on 07/13/20 at  8:45 AM EDT by a video-enabled telemedicine application and verified that I am speaking with the correct person using two identifiers.  Location: Patient: Virtual Visit Location Patient: Home Provider: Virtual Visit Location Provider: Home Office   I discussed the limitations of evaluation and management by telemedicine and  the availability of in person appointments. The patient expressed understanding and agreed to proceed.    History of Present Illness: Olivia Dougherty is a 70 y.o. who identifies as a female who was assigned female at birth, and is being seen today for COVID-19.  Had a positive home test last night.  Has cough, congestion, sore throat.  Had fever to 100.9.  Has had some aches and pains.  Denies SOB.  Marland Kitchen  HPI: HPI  Problems:  Patient Active Problem List   Diagnosis Date Noted   Aortic atherosclerosis ([place]) 06/11/2020   Insomnia 12/12/2019   Polyarthralgia 12/10/2018   Vitamin D deficiency 12/10/2018   Personal history of colonic polyps    Acute right-sided low back pain without sciatica 12/02/2017   Morbid obesity ([place]) 09/21/2017   Essential hypertension 08/10/2017   Hyperlipidemia 08/10/2017   OSA (obstructive sleep apnea) 08/10/2017   Allergic rhinitis 08/10/2017   GAD (generalized anxiety disorder) 08/10/2017   GERD (gastroesophageal reflux disease) 08/10/2017   Special screening for malignant neoplasms, colon    Benign neoplasm of ascending colon    Benign neoplasm of descending colon    Current tear knee, medial meniscus 05/25/2014   Arthritis of knee, degenerative 05/25/2014   Squamous cell carcinoma 01/13/2014   History of basal cell carcinoma 01/13/2014   Osteoarthritis 07/29/2011   Coronary atherosclerosis of native coronary artery 01/01/2006   Acquired hypothyroidism  01/01/2006    Allergies:  Allergies  Allergen Reactions   Atorvastatin Other (See Comments)    Other reaction(s): myalgia   Codeine Shortness Of Breath   Ace Inhibitors Cough        Enalapril Cough   Penicillins Hives        Sulfa Antibiotics Hives and Swelling        Adhesive [Tape] Rash        Medications:  Current Outpatient Medications:    aspirin 81 MG tablet, Take 81 mg by mouth daily., Disp: , Rfl:    augmented betamethasone dipropionate (DIPROLENE-AF) 0.05 % cream, Apply 1  application topically as needed. For red bumps (Patient not taking: Reported on 07/04/2020), Disp: , Rfl:    clopidogrel (PLAVIX) 75 MG tablet, Take 1 tablet (75 mg total) by mouth daily with breakfast., Disp: 90 tablet, Rfl: 3   cyclobenzaprine (FLEXERIL) 5 MG tablet, TAKE ONE TABLET BY MOUTH THREE TIMES A DAY AS NEEDED FOR MUSCLE SPASMS, Disp: 30 tablet, Rfl: 0   diclofenac Sodium (VOLTAREN) 1 % GEL, Apply topically as needed. (Patient not taking: Reported on 07/04/2020), Disp: , Rfl:    ezetimibe (ZETIA) 10 MG tablet, Take 1 tablet (10 mg total) by mouth daily., Disp: 90 tablet, Rfl: 3   levothyroxine (SYNTHROID) 75 MCG tablet, TAKE ONE TABLET BY MOUTH DAILY BEFORE BREAKFAST, Disp: 90 tablet, Rfl: 3   losartan (COZAAR) 50 MG tablet, Take 2 tablets (100 mg total) by mouth daily., Disp: 180 tablet, Rfl: 3   metoprolol tartrate (LOPRESSOR) 25 MG tablet, Take 1 tablet (25 mg total) by mouth 2 (two) times daily., Disp: 180 tablet, Rfl: 3   Multiple Vitamins-Minerals (MULTIVITAMIN PO), Take by mouth daily., Disp: , Rfl:    rosuvastatin (CRESTOR) 10 MG tablet, Take 1 tablet (10 mg total) by mouth daily., Disp: 90 tablet, Rfl: 3   traZODone (DESYREL) 50 MG tablet, Take 0.5-1 tablets (25-50 mg total) by mouth at bedtime as needed for sleep. (Patient not taking: Reported on 07/04/2020), Disp: 30 tablet, Rfl: 5   Vitamin D, Ergocalciferol, (DRISDOL) 1.25 MG (50000 UNIT) CAPS capsule, TAKE 1 CAPSULE BY MOUTH EVERY 14 DAYS, Disp: 6 capsule, Rfl: 3  Observations/Objective: Patient is well-developed, well-nourished in no acute distress.  Resting comfortably at home.  Head is normocephalic, atraumatic.  No labored breathing. Speech is clear and coherent with logical content.  Patient is alert and oriented at baseline.   Assessment and Plan: 1. COVID-19 -Paxlovid  -GFR>60  -No drug interactions -Tessalon -Rest and fluids -F/u if worsening   Follow Up Instructions: I discussed the assessment and treatment  plan with the patient. The patient was provided an opportunity to ask questions and all were answered. The patient agreed with the plan and demonstrated an understanding of the instructions.  A copy of instructions were sent to the patient via MyChart.  The patient was advised to call back or seek an in-person evaluation if the symptoms worsen or if the condition fails to improve as anticipated.  Time:  I spent 17 minutes with the patient via telehealth technology discussing the above problems/concerns.    Montine Circle, PA-C

## 2020-07-13 NOTE — Telephone Encounter (Signed)
Reason for Disposition  [1] HIGH RISK for severe COVID complications (e.g., weak immune system, age > 86 years, obesity with BMI > 25, pregnant, chronic lung disease or other chronic medical condition) AND [2] COVID symptoms (e.g., cough, fever)  (Exceptions: Already seen by PCP and no new or worsening symptoms.)  Answer Assessment - Initial Assessment Questions 1. COVID-19 DIAGNOSIS: "Who made your COVID-19 diagnosis?" "Was it confirmed by a positive lab test or self-test?" If not diagnosed by a doctor (or NP/PA), ask "Are there lots of cases (community spread) where you live?" Note: See public health department website, if unsure.     + COVID home test 2. COVID-19 EXPOSURE: "Was there any known exposure to COVID before the symptoms began?" CDC Definition of close contact: within 6 feet (2 meters) for a total of 15 minutes or more over a 24-hour period.      Has been out of town 3. ONSET: "When did the COVID-19 symptoms start?"      Wednesday 4. WORST SYMPTOM: "What is your worst symptom?" (e.g., cough, fever, shortness of breath, muscle aches)     Pressure in sinus- eyes, ears, throat 5. COUGH: "Do you have a cough?" If Yes, ask: "How bad is the cough?"       Yes- sleeping is hard 6. FEVER: "Do you have a fever?" If Yes, ask: "What is your temperature, how was it measured, and when did it start?"     Yes- 100.2 - 6:30- oral 7. RESPIRATORY STATUS: "Describe your breathing?" (e.g., shortness of breath, wheezing, unable to speak)      Nasal congestion, slight pain in chest with cough 8. BETTER-SAME-WORSE: "Are you getting better, staying the same or getting worse compared to yesterday?"  If getting worse, ask, "In what way?"     Worse- more cough, body aches, fever 9. HIGH RISK DISEASE: "Do you have any chronic medical problems?" (e.g., asthma, heart or lung disease, weak immune system, obesity, etc.)     Heart disease 10. VACCINE: "Have you had the COVID-19 vaccine?" If Yes, ask: "Which one,  how many shots, when did you get it?"       Yes- pfizer 11. BOOSTER: "Have you received your COVID-19 booster?" If Yes, ask: "Which one and when did you get it?"       Yes- March 2022- pfizer 12. PREGNANCY: "Is there any chance you are pregnant?" "When was your last menstrual period?"       N/a 13. OTHER SYMPTOMS: "Do you have any other symptoms?"  (e.g., chills, fatigue, headache, loss of smell or taste, muscle pain, sore throat)       Headache, chills, body aches 14. O2 SATURATION MONITOR:  "Do you use an oxygen saturation monitor (pulse oximeter) at home?" If Yes, ask "What is your reading (oxygen level) today?" "What is your usual oxygen saturation reading?" (e.g., 95%)       no  Protocols used: Coronavirus (COVID-19) Diagnosed or Suspected-A-AH

## 2020-07-13 NOTE — Telephone Encounter (Signed)
Noted  

## 2020-07-13 NOTE — Telephone Encounter (Signed)
Patient is calling to report she was recently out of town and when returned- she had symptoms and + COVID test. Patient states she has cough, nasal congestion and fever with body aches. Call to office- they do not have virtual visit available today- advised MyChart- visit with [facility]. Patient will set that up and call back if needed.

## 2020-07-16 NOTE — Telephone Encounter (Signed)
lmtcb

## 2020-07-16 NOTE — Telephone Encounter (Signed)
So labs were normal.  Unfortunately, this is not uncommon in the setting of fatigue.  Exercise can help, some people take a multivitamin, and let us know if any depression symptoms.  No need for B12 shots or additional labs today.

## 2020-07-17 NOTE — Telephone Encounter (Signed)
Left voicemail with new comments.

## 2020-08-07 ENCOUNTER — Other Ambulatory Visit: Payer: Self-pay

## 2020-10-02 ENCOUNTER — Telehealth: Payer: Self-pay | Admitting: Gastroenterology

## 2020-10-02 NOTE — Telephone Encounter (Signed)
Pt. Is calling about a CT that Dr. Allen Norris told her she needed. She does not know if he wants it before her appt of after.

## 2020-10-03 ENCOUNTER — Other Ambulatory Visit: Payer: Self-pay

## 2020-10-03 NOTE — Telephone Encounter (Signed)
Spoke with pt regarding her request for a CT scan and her constipation issue. Due to the Linzess being too expensive, pt never was able to start the medication. Advised her I will send a message to Dr. Allen Norris to look for an alternative. Pt will wait on the CT/Colonoscopy until after we try a new medication.

## 2020-10-05 ENCOUNTER — Other Ambulatory Visit: Payer: Self-pay

## 2020-10-05 MED ORDER — LUBIPROSTONE 24 MCG PO CAPS: 24.0000 ug | ORAL_CAPSULE | Freq: Two times a day (BID) | ORAL | 0 refills | Status: DC

## 2020-10-05 NOTE — Telephone Encounter (Signed)
Advised pt ok per Dr. Allen Norris to try Amitiza 68mg. 30 day supply sent to pt's pharmacy to try. Pt will call back if medication works.

## 2020-10-12 ENCOUNTER — Other Ambulatory Visit: Payer: Self-pay

## 2020-10-12 ENCOUNTER — Ambulatory Visit (INDEPENDENT_AMBULATORY_CARE_PROVIDER_SITE_OTHER): Payer: Medicare Other | Admitting: Cardiovascular Disease

## 2020-10-12 ENCOUNTER — Encounter: Payer: Self-pay | Admitting: Cardiovascular Disease

## 2020-10-12 VITALS — BP 110/60 | HR 62 | Ht 62.0 in | Wt 220.0 lb

## 2020-10-12 DIAGNOSIS — I10 Essential (primary) hypertension: Secondary | ICD-10-CM | POA: Diagnosis not present

## 2020-10-12 DIAGNOSIS — I2510 Atherosclerotic heart disease of native coronary artery without angina pectoris: Secondary | ICD-10-CM | POA: Diagnosis not present

## 2020-10-12 DIAGNOSIS — R0602 Shortness of breath: Secondary | ICD-10-CM | POA: Diagnosis not present

## 2020-10-12 DIAGNOSIS — E785 Hyperlipidemia, unspecified: Secondary | ICD-10-CM | POA: Diagnosis not present

## 2020-10-12 MED ORDER — LOSARTAN POTASSIUM 100 MG PO TABS: 100.0000 mg | ORAL_TABLET | Freq: Every day | ORAL | 3 refills | Status: DC

## 2020-10-12 MED ORDER — CLOPIDOGREL BISULFATE 75 MG PO TABS: 75.0000 mg | ORAL_TABLET | Freq: Every day | ORAL | 3 refills | Status: DC

## 2020-10-12 MED ORDER — METOPROLOL TARTRATE 25 MG PO TABS: 25.0000 mg | ORAL_TABLET | Freq: Two times a day (BID) | ORAL | 3 refills | Status: DC

## 2020-10-12 MED ORDER — ROSUVASTATIN CALCIUM 10 MG PO TABS: 10.0000 mg | ORAL_TABLET | Freq: Every day | ORAL | 3 refills | Status: DC

## 2020-10-12 MED ORDER — EZETIMIBE 10 MG PO TABS: 10.0000 mg | ORAL_TABLET | Freq: Every day | ORAL | 3 refills | Status: DC

## 2020-10-12 NOTE — Patient Instructions (Signed)
Medication Instructions:  Your physician recommends that you continue on your current medications as directed. Please refer to the Current Medication list given to you today.  *If you need a refill on your cardiac medications before your next appointment, please call your pharmacy*   Lab Work: None ordered  If you have labs (blood work) drawn today and your tests are completely normal, you will receive your results only by: [place] (if you have MyChart) OR A paper copy in the mail If you have any lab test that is abnormal or we need to change your treatment, we will call you to review the results.   Testing/Procedures: Your physician has requested that you have an echocardiogram. Echocardiography is a painless test that uses sound waves to create images of your heart. It provides your doctor with information about the size and shape of your heart and how well your heart's chambers and valves are working. This procedure takes approximately one hour. There are no restrictions for this procedure.    Follow-Up: At [facility], you and your health needs are our priority.  As part of our continuing mission to provide you with exceptional heart care, we have created designated Provider Care Teams.  These Care Teams include your primary Cardiologist (physician) and Advanced Practice Providers (APPs -  Physician Assistants and Nurse Practitioners) who all work together to provide you with the care you need, when you need it.  We recommend signing up for the patient portal called "MyChart".  Sign up information is provided on this After Visit Summary.  MyChart is used to connect with patients for Virtual Visits (Telemedicine).  Patients are able to view lab/test results, encounter notes, upcoming appointments, etc.  Non-urgent messages can be sent to your provider as well.   To learn more about what you can do with MyChart, go to [email or web address].    Your next appointment:   Your  physician wants you to follow-up in: 1 year You will receive a reminder letter in the mail two months in advance. If you don't receive a letter, please call our office to schedule the follow-up appointment.   The format for your next appointment:   In Person  Provider:   You may see Kathlyn Sacramento, MD or one of the following Advanced Practice Providers on your designated Care Team:   Murray Hodgkins, NP Christell Faith, PA-C Marrianne Mood, PA-C Cadence Kathlen Mody, [place]   Other Instructions N/A

## 2020-10-12 NOTE — Progress Notes (Signed)
Cardiology Office Note   Date:  10/12/2020   ID:  Olivia Dougherty, DOB 1950/11/03, MRN [id number]  PCP:  Virginia Crews, MD  Cardiologist:   Kathlyn Sacramento, MD   Chief Complaint  Patient presents with   Other    12 month f/u c/o sob with exertion. Meds reviewed verbally with pt.      History of Present Illness: Olivia Dougherty is a 70 y.o. female who is here today for follow-up visit regarding coronary artery disease.  She underwent LAD PCI and Cypher drug-eluting stent placement with balloon angioplasty of diagonal in 2005 by Dr. Rollene Fare.  No cardiac events since then.  Cardiac catheterization in 2010 showed patent stent with minimal restenosis. She has known history of hyperlipidemia with intolerance to most statins due to myalgia.  She had CTA of the coronary arteries in 2017 which showed minimal plaque with no obstructive disease and patent LAD stent.  She has been doing well with no chest pain.  She does report worsening exertional dyspnea but has not been exercising on a regular basis.  She takes her medications regularly.  She had diverticulitis in May and was treated with antibiotics.  She might need colonoscopy in the near future.   Past Medical History:  Diagnosis Date   Anxiety    Arthritis    lower back   Basal cell carcinoma 11/2011   face, left leg-using chemo cream on temple area   Coronary artery disease 2005   LAD PCI with Cypher drug-eluting stent placement 3.0 x 13 mm with balloon angioplasty of diagonal.  Cardiac catheterization in 2010 showed patent stent with minimal restenosis.   GERD (gastroesophageal reflux disease)    Heart disease    Heart problem    98% blockage   Hypercholesteremia    Hypertension    Hypothyroidism    Kidney stone    Osteoarthritis    PONV (postoperative nausea and vomiting)    after 1 procedure approx 10 yrs ago   Sleep apnea    mild, could not tol cpap   Squamous cell carcinoma of skin    leg     Past Surgical History:  Procedure Laterality Date   ANGIOPLASTY  2005   CAD   98% blockage   CARDIAC CATHETERIZATION  02/2008   CATARACT EXTRACTION Right    CATARACT EXTRACTION W/PHACO Right 08/31/2019   Procedure: CATARACT EXTRACTION PHACO AND INTRAOCULAR LENS PLACEMENT ([place]) RIGHT;  Surgeon: Eulogio Bear, MD;  Location: [facility];  Service: Ophthalmology;  Laterality: Right;  4.41 0:30.7   CATARACT EXTRACTION W/PHACO Left 09/26/2019   Procedure: CATARACT EXTRACTION PHACO AND INTRAOCULAR LENS PLACEMENT ([facility]) LEFT 1.60 00:20.3;  Surgeon: Eulogio Bear, MD;  Location: [facility];  Service: Ophthalmology;  Laterality: Left;   COLONOSCOPY WITH PROPOFOL N/A 12/29/2014   Procedure: COLONOSCOPY WITH PROPOFOL;  Surgeon: Lucilla Lame, MD;  Location: [facility];  Service: Endoscopy;  Laterality: N/A;   COLONOSCOPY WITH PROPOFOL N/A 12/31/2017   Procedure: COLONOSCOPY WITH PROPOFOL;  Surgeon: Lucilla Lame, MD;  Location: [facility];  Service: Endoscopy;  Laterality: N/A;  sleep apnea   CORONARY ANGIOPLASTY  2005   LAD   EYE SURGERY Right    HYSTEROSCOPY  11/2001   resection of benign leiomyoma   KNEE ARTHROSCOPY Right 08/16/2014   Procedure: ARTHROSCOPY KNEE, partial lateral menisectomy and chondroplasty;  Surgeon: Dereck Leep, MD;  Location: [facility];  Service: Orthopedics;  Laterality: Right;  POLYPECTOMY  12/29/2014   Procedure: POLYPECTOMY;  Surgeon: Lucilla Lame, MD;  Location: [facility];  Service: Endoscopy;;   SKIN CANCER EXCISION Right 02/2018   Right leg - Squamous    THYROID CYST EXCISION Left 10/2002   left lobe thyroid excision   THYROIDECTOMY, PARTIAL Left    TUBAL LIGATION  [id number]   BTL   UMBILICAL HERNIA REPAIR  1980   vulvar cancer  1993   laser     Current Outpatient Medications  Medication Sig Dispense Refill   aspirin 81 MG tablet Take 81 mg by mouth daily.     augmented betamethasone dipropionate (DIPROLENE-AF)  0.05 % cream Apply 1 application topically as needed. For red bumps     clopidogrel (PLAVIX) 75 MG tablet Take 1 tablet (75 mg total) by mouth daily with breakfast. 90 tablet 3   cyclobenzaprine (FLEXERIL) 5 MG tablet TAKE ONE TABLET BY MOUTH THREE TIMES A DAY AS NEEDED FOR MUSCLE SPASMS 30 tablet 0   diclofenac Sodium (VOLTAREN) 1 % GEL Apply topically as needed.     ezetimibe (ZETIA) 10 MG tablet Take 1 tablet (10 mg total) by mouth daily. 90 tablet 3   levothyroxine (SYNTHROID) 75 MCG tablet TAKE ONE TABLET BY MOUTH DAILY BEFORE BREAKFAST 90 tablet 3   losartan (COZAAR) 50 MG tablet Take 2 tablets (100 mg total) by mouth daily. 180 tablet 3   metoprolol tartrate (LOPRESSOR) 25 MG tablet Take 1 tablet (25 mg total) by mouth 2 (two) times daily. 180 tablet 3   Multiple Vitamins-Minerals (MULTIVITAMIN PO) Take by mouth daily.     rosuvastatin (CRESTOR) 10 MG tablet Take 1 tablet (10 mg total) by mouth daily. 90 tablet 3   traZODone (DESYREL) 50 MG tablet Take 0.5-1 tablets (25-50 mg total) by mouth at bedtime as needed for sleep. 30 tablet 5   Vitamin D, Ergocalciferol, (DRISDOL) 1.25 MG (50000 UNIT) CAPS capsule TAKE 1 CAPSULE BY MOUTH EVERY 14 DAYS 6 capsule 3   No current facility-administered medications for this visit.    Allergies:   Atorvastatin, Codeine, Ace inhibitors, Enalapril, Penicillins, Sulfa antibiotics, and Adhesive [tape]    Social History:  The patient  reports that she has never smoked. She has never used smokeless tobacco. She reports current alcohol use of about 2.0 standard drinks per week. She reports that she does not use drugs.   Family History:  The patient's family history includes Cervical cancer in her sister; Diabetes in her father; Heart disease in her father; Hypertension in her father and mother; Stroke in her sister.    ROS:  Please see the history of present illness.   Otherwise, review of systems are positive for none.   All other systems are reviewed and  negative.    PHYSICAL EXAM: VS:  BP 110/60 (BP Location: Left Arm, Patient Position: Sitting, Cuff Size: Large)   Pulse 62   Ht '5\' 2"'$  (1.575 m)   Wt 220 lb (99.8 kg)   LMP 01/28/2007 (Approximate)   SpO2 99%   BMI 40.24 kg/m  , BMI Body mass index is 40.24 kg/m. GEN: Well nourished, well developed, in no acute distress  HEENT: normal  Neck: no JVD, carotid bruits, or masses Cardiac: RRR; no  rubs, or gallops,no edema .  1/6 systolic ejection murmur in the aortic area Respiratory:  clear to auscultation bilaterally, normal work of breathing GI: soft, nontender, nondistended, + BS MS: no deformity or atrophy  Skin: warm and dry, no rash  Neuro:  Strength and sensation are intact Psych: euthymic mood, full affect   EKG:  EKG is ordered today. The ekg ordered today demonstrates normal sinus rhythm with no significant ST or T wave changes.   Recent Labs: 06/01/2020: ALT 21; BUN 16; Creatinine, Ser 0.82; Hemoglobin 15.0; Platelets 209; Potassium 3.9; Sodium 140 06/12/2020: TSH 3.420    Lipid Panel    Component Value Date/Time   CHOL 115 06/12/2020 0831   TRIG 80 06/12/2020 0831   HDL 56 06/12/2020 0831   CHOLHDL 2.1 06/12/2020 0831   CHOLHDL 2.8 05/21/2017 0957   VLDL 22 05/21/2017 0957   LDLCALC 43 06/12/2020 0831      Wt Readings from Last 3 Encounters:  10/12/20 220 lb (99.8 kg)  07/04/20 215 lb 3.2 oz (97.6 kg)  06/11/20 217 lb 1.6 oz (98.5 kg)       No flowsheet data found.    ASSESSMENT AND PLAN:  1.  Coronary artery disease involving native coronary arteries without angina: Given that she had first generation drug-eluting stent to the LAD, I favor continued lifelong dual antiplatelet therapy as tolerated.  She has no chest pain at the present time but she does report worsening exertional dyspnea.  Thus, I requested an echocardiogram for evaluation.  2.  Hyperlipidemia with intolerance to statins.  Her LDL was 84 last year and thus I added Zetia at that  time.  I reviewed most recent lipid profile with her which showed significant improvement with an LDL down to 43.  3.  Essential hypertension: Blood pressure is well controlled on current medications.   Disposition:   FU with me in 12 months  Signed,  Kathlyn Sacramento, MD  10/12/2020 9:37 AM    [place]

## 2020-10-16 ENCOUNTER — Ambulatory Visit (INDEPENDENT_AMBULATORY_CARE_PROVIDER_SITE_OTHER): Payer: Medicare Other

## 2020-10-16 ENCOUNTER — Other Ambulatory Visit: Payer: Self-pay

## 2020-10-16 DIAGNOSIS — R0602 Shortness of breath: Secondary | ICD-10-CM | POA: Diagnosis not present

## 2020-10-17 LAB — ECHOCARDIOGRAM COMPLETE
AR max vel: 2.37 cm2
AV Area VTI: 2.55 cm2
AV Area mean vel: 2.27 cm2
AV Mean grad: 5 mmHg
AV Peak grad: 11.4 mmHg
Ao pk vel: 1.69 m/s
Area-P 1/2: 3.2 cm2
S' Lateral: 2.9 cm

## 2020-10-22 ENCOUNTER — Encounter: Payer: Self-pay | Admitting: Family Medicine

## 2020-10-23 ENCOUNTER — Other Ambulatory Visit: Payer: Self-pay | Admitting: Obstetrics & Gynecology

## 2020-10-23 DIAGNOSIS — Z1231 Encounter for screening mammogram for malignant neoplasm of breast: Secondary | ICD-10-CM

## 2020-10-25 ENCOUNTER — Telehealth: Payer: Self-pay | Admitting: Cardiovascular Disease

## 2020-10-25 NOTE — Telephone Encounter (Signed)
Spoke with the patient. Advised her that the interpretation is considered non-specific. We defer to the cardiologist interpretation. Patient made aware of Dr. Tyrell Antonio 10/12/20 documentation:   "EKG is ordered today. The ekg ordered today demonstrates normal sinus rhythm with no significant ST or T wave changes."  Patient verbalized understanding and voiced appreciation for the call back.

## 2020-10-25 NOTE — Telephone Encounter (Signed)
Patient calling States she saw on mychart that one of her EKGs from 09/16 was normal and one was abnormal  Patient would just like a clarification Please call to discuss

## 2020-10-30 ENCOUNTER — Other Ambulatory Visit: Payer: Self-pay

## 2020-10-30 ENCOUNTER — Ambulatory Visit: Payer: Medicare Other | Attending: Internal Medicine

## 2020-10-30 DIAGNOSIS — Z23 Encounter for immunization: Secondary | ICD-10-CM

## 2020-10-30 MED ORDER — PFIZER COVID-19 VAC BIVALENT 30 MCG/0.3ML IM SUSP
INTRAMUSCULAR | 0 refills | Status: DC
  Filled 2020-10-30: qty 0.3, 1d supply, fill #0

## 2020-10-30 NOTE — Progress Notes (Signed)
   Covid-19 Vaccination Clinic  Name:  Olivia Dougherty    MRN: [id number] DOB: [birth year]  10/30/2020  Olivia Dougherty was observed post Covid-19 immunization for 15 minutes without incident. She was provided with Vaccine Information Sheet and instruction to access the V-Safe system.   Olivia Dougherty was instructed to call 911 with any severe reactions post vaccine: Difficulty breathing  Swelling of face and throat  A fast heartbeat  A bad rash all over body  Dizziness and weakness   Lu Duffel, PharmD, MBA Clinical Acute Care Pharmacist

## 2020-11-01 ENCOUNTER — Other Ambulatory Visit: Payer: Self-pay

## 2020-11-01 ENCOUNTER — Ambulatory Visit (INDEPENDENT_AMBULATORY_CARE_PROVIDER_SITE_OTHER): Payer: Medicare Other | Admitting: Podiatry

## 2020-11-01 DIAGNOSIS — L600 Ingrowing nail: Secondary | ICD-10-CM | POA: Diagnosis not present

## 2020-11-01 DIAGNOSIS — I2510 Atherosclerotic heart disease of native coronary artery without angina pectoris: Secondary | ICD-10-CM

## 2020-11-01 NOTE — Patient Instructions (Signed)

## 2020-11-02 ENCOUNTER — Telehealth: Payer: Self-pay | Admitting: *Deleted

## 2020-11-02 NOTE — Telephone Encounter (Signed)
Patient's husband is calling because her great toes are continue to bleed, changing gauzes, soaking in epsom salts, using neosporin,even tightening the edges of the bandages.Please advise, is on Plavix. Returned the call to patient's husband and gave instructions per doctor to soak in warm water,if  bleeding continues, or worsens, please go to ER, verbalized understanding.

## 2020-11-03 ENCOUNTER — Emergency Department
Admission: EM | Admit: 2020-11-03 | Discharge: 2020-11-03 | Disposition: A | Discharge status: Home / Self Care | Payer: Medicare Other | Attending: Emergency Medicine | Admitting: Emergency Medicine

## 2020-11-03 ENCOUNTER — Other Ambulatory Visit: Payer: Self-pay

## 2020-11-03 ENCOUNTER — Encounter: Payer: Self-pay | Admitting: Emergency Medicine

## 2020-11-03 DIAGNOSIS — Z7902 Long term (current) use of antithrombotics/antiplatelets: Secondary | ICD-10-CM | POA: Insufficient documentation

## 2020-11-03 DIAGNOSIS — E039 Hypothyroidism, unspecified: Secondary | ICD-10-CM | POA: Diagnosis not present

## 2020-11-03 DIAGNOSIS — I10 Essential (primary) hypertension: Secondary | ICD-10-CM | POA: Diagnosis not present

## 2020-11-03 DIAGNOSIS — S99921A Unspecified injury of right foot, initial encounter: Secondary | ICD-10-CM | POA: Diagnosis present

## 2020-11-03 DIAGNOSIS — Z85828 Personal history of other malignant neoplasm of skin: Secondary | ICD-10-CM | POA: Diagnosis not present

## 2020-11-03 DIAGNOSIS — Z7982 Long term (current) use of aspirin: Secondary | ICD-10-CM | POA: Diagnosis not present

## 2020-11-03 DIAGNOSIS — S91201A Unspecified open wound of right great toe with damage to nail, initial encounter: Secondary | ICD-10-CM | POA: Insufficient documentation

## 2020-11-03 DIAGNOSIS — Z79899 Other long term (current) drug therapy: Secondary | ICD-10-CM | POA: Insufficient documentation

## 2020-11-03 DIAGNOSIS — S91209A Unspecified open wound of unspecified toe(s) with damage to nail, initial encounter: Secondary | ICD-10-CM

## 2020-11-03 DIAGNOSIS — I2510 Atherosclerotic heart disease of native coronary artery without angina pectoris: Secondary | ICD-10-CM | POA: Diagnosis not present

## 2020-11-03 DIAGNOSIS — S91202A Unspecified open wound of left great toe with damage to nail, initial encounter: Secondary | ICD-10-CM | POA: Diagnosis not present

## 2020-11-03 DIAGNOSIS — X58XXXA Exposure to other specified factors, initial encounter: Secondary | ICD-10-CM | POA: Insufficient documentation

## 2020-11-03 NOTE — ED Triage Notes (Signed)
Pt via POV from home. Pt c/o toe pain and pt states that the R great toe will not stop bleeding. Pt had some ingrown toenails on her R great toe, L great toe, and 2nd toe removed on 10/6. Pt is on Plavix. Bleeding controlled. Pt is A&Ox4 and NAD.

## 2020-11-03 NOTE — ED Provider Notes (Signed)
[facility] Emergency Department Provider Note   ____________________________________________   Event Date/Time   First MD Initiated Contact with Patient 11/03/20 1135     (approximate)  I have reviewed the triage vital signs and the nursing notes.   HISTORY  Chief Complaint Toe Pain    HPI Olivia Dougherty is a 70 y.o. female patient presents to the emergency room for bleeding from right great toe after toenail removal Thursday.  Patient was seen by podiatry on Thursday and had bilateral great toe and left second toenails removed due to fungal infection.  Patient was advised to soak in Epson salt twice a day starting yesterday.  Patient noted that right great toe was not clotting and was actively bleeding versus oozing as she was told by podiatrist.  Patient is currently on Plavix and aspirin therapy.  Her husband has had her to hold the aspirin and the Plavix today due to the bleeding.  Patient denies pain.  Patient has had to rewrap the right toe multiple times throughout the day to control the bleeding.  Past Medical History:  Diagnosis Date   Anxiety    Arthritis    lower back   Basal cell carcinoma 11/2011   face, left leg-using chemo cream on temple area   Coronary artery disease 2005   LAD PCI with Cypher drug-eluting stent placement 3.0 x 13 mm with balloon angioplasty of diagonal.  Cardiac catheterization in 2010 showed patent stent with minimal restenosis.   GERD (gastroesophageal reflux disease)    Heart disease    Heart problem    98% blockage   Hypercholesteremia    Hypertension    Hypothyroidism    Kidney stone    Osteoarthritis    PONV (postoperative nausea and vomiting)    after 1 procedure approx 10 yrs ago   Sleep apnea    mild, could not tol cpap   Squamous cell carcinoma of skin    leg    Patient Active Problem List   Diagnosis Date Noted   Aortic atherosclerosis ([place]) 06/11/2020   Insomnia 12/12/2019    Polyarthralgia 12/10/2018   Vitamin D deficiency 12/10/2018   Personal history of colonic polyps    Acute right-sided low back pain without sciatica 12/02/2017   Morbid obesity ([place]) 09/21/2017   Essential hypertension 08/10/2017   Hyperlipidemia 08/10/2017   OSA (obstructive sleep apnea) 08/10/2017   Allergic rhinitis 08/10/2017   GAD (generalized anxiety disorder) 08/10/2017   GERD (gastroesophageal reflux disease) 08/10/2017   Special screening for malignant neoplasms, colon    Benign neoplasm of ascending colon    Benign neoplasm of descending colon    Current tear knee, medial meniscus 05/25/2014   Arthritis of knee, degenerative 05/25/2014   Squamous cell carcinoma 01/13/2014   History of basal cell carcinoma 01/13/2014   Osteoarthritis 07/29/2011   Coronary atherosclerosis of native coronary artery 01/01/2006   Acquired hypothyroidism 01/01/2006    Past Surgical History:  Procedure Laterality Date   ANGIOPLASTY  2005   CAD   98% blockage   CARDIAC CATHETERIZATION  02/2008   CATARACT EXTRACTION Right    CATARACT EXTRACTION W/PHACO Right 08/31/2019   Procedure: CATARACT EXTRACTION PHACO AND INTRAOCULAR LENS PLACEMENT ([place]) RIGHT;  Surgeon: Eulogio Bear, MD;  Location: [place];  Service: Ophthalmology;  Laterality: Right;  4.41 0:30.7   CATARACT EXTRACTION W/PHACO Left 09/26/2019   Procedure: CATARACT EXTRACTION PHACO AND INTRAOCULAR LENS PLACEMENT ([facility]) LEFT 1.60 00:20.3;  Surgeon: Benay Pillow  Elta Guadeloupe, MD;  Location: [place];  Service: Ophthalmology;  Laterality: Left;   COLONOSCOPY WITH PROPOFOL N/A 12/29/2014   Procedure: COLONOSCOPY WITH PROPOFOL;  Surgeon: Lucilla Lame, MD;  Location: [place];  Service: Endoscopy;  Laterality: N/A;   COLONOSCOPY WITH PROPOFOL N/A 12/31/2017   Procedure: COLONOSCOPY WITH PROPOFOL;  Surgeon: Lucilla Lame, MD;  Location: [place];  Service: Endoscopy;  Laterality: N/A;  sleep apnea   CORONARY  ANGIOPLASTY  2005   LAD   EYE SURGERY Right    HYSTEROSCOPY  11/2001   resection of benign leiomyoma   KNEE ARTHROSCOPY Right 08/16/2014   Procedure: ARTHROSCOPY KNEE, partial lateral menisectomy and chondroplasty;  Surgeon: Dereck Leep, MD;  Location: [facility];  Service: Orthopedics;  Laterality: Right;   POLYPECTOMY  12/29/2014   Procedure: POLYPECTOMY;  Surgeon: Lucilla Lame, MD;  Location: [place];  Service: Endoscopy;;   SKIN CANCER EXCISION Right 02/2018   Right leg - Squamous    THYROID CYST EXCISION Left 10/2002   left lobe thyroid excision   THYROIDECTOMY, PARTIAL Left    TUBAL LIGATION  4098   [place]   vulvar cancer  1993   laser    Prior to Admission medications   Medication Sig Start Date End Date Taking? Authorizing Provider  aspirin 81 MG tablet Take 81 mg by mouth daily.    [provider]  augmented betamethasone dipropionate (DIPROLENE-AF) 0.05 % cream Apply 1 application topically as needed. For red bumps 09/01/19   [provider]  clopidogrel (PLAVIX) 75 MG tablet Take 1 tablet (75 mg total) by mouth daily with breakfast. 10/12/20   Wellington Hampshire, MD  COVID-19 mRNA bivalent vaccine, Pfizer, (PFIZER COVID-19 VAC BIVALENT) injection Inject into the muscle. 10/30/20   Carlyle Basques, MD  cyclobenzaprine (FLEXERIL) 5 MG tablet TAKE ONE TABLET BY MOUTH THREE TIMES A DAY AS NEEDED FOR MUSCLE SPASMS 01/10/19   Bacigalupo, Dionne Bucy, MD  diclofenac Sodium (VOLTAREN) 1 % GEL Apply topically as needed. 12/17/18   [provider]  ezetimibe (ZETIA) 10 MG tablet Take 1 tablet (10 mg total) by mouth daily. 10/12/20 01/10/21  Wellington Hampshire, MD  levothyroxine (SYNTHROID) 75 MCG tablet TAKE ONE TABLET BY MOUTH DAILY BEFORE BREAKFAST 07/08/20   Bacigalupo, Dionne Bucy, MD  losartan (COZAAR) 100 MG tablet Take 1 tablet (100 mg total) by mouth daily. 10/12/20   Wellington Hampshire, MD  metoprolol tartrate (LOPRESSOR) 25  MG tablet Take 1 tablet (25 mg total) by mouth 2 (two) times daily. 10/12/20   Wellington Hampshire, MD  Multiple Vitamins-Minerals (MULTIVITAMIN PO) Take by mouth daily.    [provider]  rosuvastatin (CRESTOR) 10 MG tablet Take 1 tablet (10 mg total) by mouth daily. 10/12/20   Wellington Hampshire, MD  traZODone (DESYREL) 50 MG tablet Take 0.5-1 tablets (25-50 mg total) by mouth at bedtime as needed for sleep. 12/12/19   Virginia Crews, MD  Vitamin D, Ergocalciferol, (DRISDOL) 1.25 MG (50000 UNIT) CAPS capsule TAKE 1 CAPSULE BY MOUTH EVERY 14 DAYS 12/12/19   Bacigalupo, Dionne Bucy, MD    Allergies Atorvastatin, Codeine, Ace inhibitors, Enalapril, Penicillins, Sulfa antibiotics, and Adhesive [tape]  Family History  Problem Relation Age of Onset   Hypertension Mother    Hypertension Father    Diabetes Father    Heart disease Father    Cervical cancer Sister    Stroke Sister  Colon cancer Neg Hx    Breast cancer Neg Hx    Ovarian cancer Neg Hx     Social History Social History   Tobacco Use   Smoking status: Never   Smokeless tobacco: Never  Vaping Use   Vaping Use: Never used  Substance Use Topics   Alcohol use: Yes    Alcohol/week: 2.0 standard drinks    Types: 2 Glasses of wine per week    Comment: weekends   Drug use: No    Review of Systems  Constitutional: No fever/chills Eyes: No visual changes. ENT: No sore throat. Cardiovascular: Denies chest pain. Respiratory: Denies shortness of breath. Gastrointestinal: No abdominal pain.  No nausea, no vomiting.  No diarrhea.  No constipation. Genitourinary: Negative for dysuria. Musculoskeletal: Negative for back pain. Skin: Toenail removal to bilateral great toes and left second toe.  Bleeding noted to right great toe. Neurological: Negative for headaches, focal weakness or numbness.   ____________________________________________   PHYSICAL EXAM:  VITAL SIGNS: ED Triage Vitals  Enc Vitals Group      BP 11/03/20 1127 121/82     Pulse Rate 11/03/20 1127 67     Resp 11/03/20 1127 20     Temp 11/03/20 1127 98 F (36.7 C)     Temp Source 11/03/20 1127 Oral     SpO2 11/03/20 1127 96 %     Weight 11/03/20 1128 220 lb (99.8 kg)     Height 11/03/20 1128 5\' 2"  (1.575 m)     Head Circumference --      Peak Flow --      Pain Score 11/03/20 1127 0     Pain Loc --      Pain Edu? --      Excl. in [place]? --     Constitutional: Alert and oriented. Well appearing and in no acute distress. Eyes: Conjunctivae are normal. PERRL. EOMI. Head: Atraumatic. Cardiovascular: Normal rate, regular rhythm. Grossly normal heart sounds.  Good peripheral circulation. Respiratory: Normal respiratory effort.  No retractions. Lungs CTAB. Musculoskeletal: No lower extremity tenderness nor edema.  No joint effusions. Neurologic:  Normal speech and language. No gross focal neurologic deficits are appreciated. No gait instability. Skin:  Skin is warm, dry and intact. No rash noted.  Patient is noted to have bilateral great toenails removed and left second toenail removed as well active bleeding noted to the right great toenail.  No signs of infection noted. Psychiatric: Mood and affect are normal. Speech and behavior are normal.  ____________________________________________   LABS (all labs ordered are listed, but only abnormal results are displayed)  Labs Reviewed - No data to display ____________________________________________  EKG   ____________________________________________  RADIOLOGY  ED MD interpretation:    Official radiology report(s): No results found.  ____________________________________________   PROCEDURES  Procedure(s) performed: None  Procedures  Critical Care performed: No  ____________________________________________   INITIAL IMPRESSION / ASSESSMENT AND PLAN / ED COURSE     70 year old female reports the emergency room after having bilateral great toe and left toenail  removed for fungal infection on Thursday by podiatrist.  Patient reports that when she started soaking in Epson salt twice a day as told to by the podiatrist that the right great toenail seem to not stop bleeding.  Since removed from bilateral great toes left toenail seems to be clotting appropriately.  Right great toe nailbed is actively bleeding.  Surgicel applied with gauze and Coban wrap to bilateral great toes.  Patient is encouraged to  leave dressing intact to right great toe for the remainder of the day.  She is to change dressing daily to the right great toe with careful not to remove the Surgicel and only trim around the area that has not adhered to the right toenail bed.  She should contact podiatry on Monday to notify them of this emergency room visit. Discharge patient in stable condition at this time.      ____________________________________________   FINAL CLINICAL IMPRESSION(S) / ED DIAGNOSES  Final diagnoses:  Wound of toenail, initial encounter     ED Discharge Orders     None        Note:  This document was prepared using Dragon voice recognition software and may include unintentional dictation errors.     Willaim Rayas, NP 11/03/20 1222    Rada Hay, MD 11/03/20 [phone]

## 2020-11-04 NOTE — Progress Notes (Signed)
Subjective:   Patient ID: Olivia Dougherty, female   DOB: 70 y.o.   MRN: [id number]   HPI Patient presents stating she has had trouble with her big toenails of both feet and they bother her and at times are hard to wear shoe gear with.  States that they do get sore.  Patient does not smoke likes to be active   Review of Systems  All other systems reviewed and are negative.      Objective:  Physical Exam Vitals and nursing note reviewed.  Constitutional:      Appearance: She is well-developed.  Pulmonary:     Effort: Pulmonary effort is normal.  Musculoskeletal:        General: Normal range of motion.  Skin:    General: Skin is warm.  Neurological:     Mental Status: She is alert.    Neurovascular status intact muscle strength found to be within normal limits with patient found to have incurvated nailbeds of the hallux bilateral with history of the corners being removed and not significant amount of nail left currently     Assessment:  Chronic nail disease of the hallux bilateral with incurvation and thickness noted of the nailbeds bilateral     Plan:  H&P reviewed condition and I do think at 1 point correction would be of best service for her long-term but currently organ to hold off and just tried trimming and wider shoes.  Educated her on permanent type procedure and removal of nailbeds performed

## 2020-11-05 ENCOUNTER — Other Ambulatory Visit: Payer: Self-pay

## 2020-11-05 ENCOUNTER — Ambulatory Visit: Payer: Medicare Other | Admitting: Podiatry

## 2020-11-05 ENCOUNTER — Ambulatory Visit (INDEPENDENT_AMBULATORY_CARE_PROVIDER_SITE_OTHER): Payer: Medicare Other | Admitting: Podiatry

## 2020-11-05 DIAGNOSIS — L600 Ingrowing nail: Secondary | ICD-10-CM

## 2020-11-05 MED ORDER — TRAMADOL HCL 50 MG PO TABS: 50.0000 mg | ORAL_TABLET | Freq: Three times a day (TID) | ORAL | 0 refills | Status: AC | PRN

## 2020-11-05 MED ORDER — DOXYCYCLINE HYCLATE 100 MG PO TABS: 100.0000 mg | ORAL_TABLET | Freq: Two times a day (BID) | ORAL | 0 refills | Status: DC

## 2020-11-07 NOTE — Progress Notes (Signed)
Subjective:   Patient ID: Olivia Dougherty, female   DOB: 70 y.o.   MRN: [id number]   HPI Patient presents stating that she has had a lot of bleeding of her right big toenail removal and is concerned about what might be happening with this.  States that it is gotten better but she wanted it checked neuro   ROS      Objective:  Physical Exam  Vascular status intact with patient's right hallux nail showing some clear fluid but no current bleeding and it appears to be much better with no proximal edema erythema drainage      Assessment:  Appears to be a low level condition without any significant pathology     Plan:  H&P reviewed condition and recommended continued soaks compression and it should heal uneventfully and if any issues were to occur patient is to let us know right away

## 2020-11-09 ENCOUNTER — Telehealth: Payer: Self-pay | Admitting: *Deleted

## 2020-11-09 NOTE — Telephone Encounter (Signed)
Patient  is calling for instructions on post care of toes after nail removal procedureand if she should use any type of ointment on the toe. Returned call to patient, giving instructions per after visit care of the nails and what signs to look for of possible infections, verbalized understanding.

## 2020-11-20 ENCOUNTER — Other Ambulatory Visit: Payer: Self-pay

## 2020-11-20 MED ORDER — FLUAD QUADRIVALENT 0.5 ML IM PRSY
PREFILLED_SYRINGE | INTRAMUSCULAR | 0 refills | Status: DC
  Filled 2020-11-20: qty 0.5, 1d supply, fill #0

## 2020-11-21 ENCOUNTER — Ambulatory Visit (INDEPENDENT_AMBULATORY_CARE_PROVIDER_SITE_OTHER): Payer: Medicare Other

## 2020-11-21 ENCOUNTER — Other Ambulatory Visit: Payer: Self-pay

## 2020-11-21 DIAGNOSIS — Z23 Encounter for immunization: Secondary | ICD-10-CM

## 2020-11-22 ENCOUNTER — Telehealth: Payer: Self-pay | Admitting: *Deleted

## 2020-11-22 NOTE — Telephone Encounter (Signed)
Patient is calling because her right great  toe is still painful at times, oozing yellowish straw color liquid, is seeing a lot of mucus on the nailbed.She is continuing to soak and has completed antibiotic. Please advise.

## 2020-11-23 ENCOUNTER — Telehealth: Payer: Self-pay | Admitting: *Deleted

## 2020-11-23 NOTE — Telephone Encounter (Signed)
Called patient and gave recommendations per Dr Paulla Dolly , verbalized understanding and she will call back to schedule if toe does not continue to improve.

## 2020-11-23 NOTE — Telephone Encounter (Signed)
If not improving over weekend then come in to check next week

## 2020-11-26 ENCOUNTER — Other Ambulatory Visit: Payer: Self-pay

## 2020-11-26 ENCOUNTER — Other Ambulatory Visit: Payer: Self-pay | Admitting: Family Medicine

## 2020-11-26 ENCOUNTER — Ambulatory Visit (INDEPENDENT_AMBULATORY_CARE_PROVIDER_SITE_OTHER): Payer: Medicare Other | Admitting: Podiatry

## 2020-11-26 ENCOUNTER — Encounter: Payer: Self-pay | Admitting: Podiatry

## 2020-11-26 DIAGNOSIS — L03031 Cellulitis of right toe: Secondary | ICD-10-CM

## 2020-11-26 DIAGNOSIS — I2510 Atherosclerotic heart disease of native coronary artery without angina pectoris: Secondary | ICD-10-CM

## 2020-11-26 DIAGNOSIS — E559 Vitamin D deficiency, unspecified: Secondary | ICD-10-CM

## 2020-11-26 NOTE — Progress Notes (Signed)
Subjective:   Patient ID: Olivia Dougherty, female   DOB: 70 y.o.   MRN: [id number]   HPI Patient presents stating that she is still concerned about her nails she wants them checked especially the right hallux but both big toe she is worried about drainage and infection   ROS      Objective:  Physical Exam  Neuro vascular status intact crusted tissue formation on the hallux bilateral but localized with no proximal edema erythema or drainage noted     Assessment:  Appears to be more of a localized process versus a systemic inflammatory or infective process     Plan:  H&P I reviewed that it does seem to be crusting over and getting better but it may take some time for her to heal completely but I do not see signs of acute infection but I gave her all the symptoms to look for and if any changes were to occur she is to let us know immediately.  Continue soaks bandage usage at the current time

## 2020-11-26 NOTE — Telephone Encounter (Signed)
Requested medications are due for refill today.  yes  Requested medications are on the active medications list.  yes  Last refill. 12/12/2019  Future visit scheduled.   yes  Notes to clinic.  Medication not delegated. 

## 2020-11-27 NOTE — Telephone Encounter (Signed)
Does she still need to be on the RX one or OTC supplement.

## 2020-12-09 ENCOUNTER — Other Ambulatory Visit: Payer: Self-pay | Admitting: Family Medicine

## 2020-12-10 NOTE — Telephone Encounter (Signed)
Requested medication (s) are due for refill today:   Provider to review  Requested medication (s) are on the active medication list:   Yes but from 2020  Future visit scheduled:   No   Last ordered: 01/10/2019  #30, 0 refills  Returned because it's a non delegated refill per protocol.   Requested Prescriptions  Pending Prescriptions Disp Refills   cyclobenzaprine (FLEXERIL) 5 MG tablet [Pharmacy Med Name: CYCLOBENZAPRINE 5 MG TABLET] 30 tablet 0    Sig: TAKE ONE TABLET BY MOUTH THREE TIMES A DAY AS NEEDED FOR MUSCLE SPASMS     Not Delegated - Analgesics:  Muscle Relaxants Failed - 12/09/2020 11:46 AM      Failed - This refill cannot be delegated      Failed - Valid encounter within last 6 months    Recent Outpatient Visits           6 months ago Essential hypertension   [place], Dionne Bucy, MD   12 months ago Essential hypertension   [place], Dionne Bucy, MD   12 months ago Encounter for [employer] annual wellness exam   [facility], Stage manager, LPN   1 year ago [facility], Vickki Muff, PA-C   1 year ago Essential hypertension   [place] Bacigalupo, Dionne Bucy, MD       Future Appointments             Tomorrow Lucilla Lame, [place]

## 2020-12-10 NOTE — Telephone Encounter (Signed)
Patient has an upcoming appt. 12/17/20 with [facility] and Dr.B

## 2020-12-11 ENCOUNTER — Telehealth: Payer: Self-pay

## 2020-12-11 ENCOUNTER — Ambulatory Visit (INDEPENDENT_AMBULATORY_CARE_PROVIDER_SITE_OTHER): Payer: Medicare Other | Admitting: Gastroenterology

## 2020-12-11 ENCOUNTER — Encounter: Payer: Self-pay | Admitting: Gastroenterology

## 2020-12-11 ENCOUNTER — Telehealth: Payer: Self-pay | Admitting: Cardiovascular Disease

## 2020-12-11 ENCOUNTER — Other Ambulatory Visit: Payer: Self-pay

## 2020-12-11 VITALS — BP 166/75 | HR 56 | Temp 98.4°F | Wt 221.4 lb

## 2020-12-11 DIAGNOSIS — K5904 Chronic idiopathic constipation: Secondary | ICD-10-CM | POA: Diagnosis not present

## 2020-12-11 DIAGNOSIS — K5792 Diverticulitis of intestine, part unspecified, without perforation or abscess without bleeding: Secondary | ICD-10-CM

## 2020-12-11 DIAGNOSIS — I2510 Atherosclerotic heart disease of native coronary artery without angina pectoris: Secondary | ICD-10-CM

## 2020-12-11 HISTORY — DX: Diverticulitis of intestine, part unspecified, without perforation or abscess without bleeding: K57.92

## 2020-12-11 MED ORDER — PEG 3350-KCL-NA BICARB-NACL 420 G PO SOLR: 4000.0000 mL | Freq: Once | ORAL | 0 refills | Status: AC

## 2020-12-11 MED ORDER — LUBIPROSTONE 24 MCG PO CAPS: 24.0000 ug | ORAL_CAPSULE | Freq: Two times a day (BID) | ORAL | 2 refills | Status: DC

## 2020-12-11 NOTE — Telephone Encounter (Signed)
error 

## 2020-12-11 NOTE — Telephone Encounter (Signed)
Blood thinner request faxed to Dr Fletcher Anon

## 2020-12-11 NOTE — H&P (View-Only) (Signed)
Primary Care Physician: Virginia Crews, MD  Primary Gastroenterologist:  Dr. Lucilla Lame  Chief Complaint  Patient presents with   Abdominal Pain    HPI: Olivia Dougherty is a 70 y.o. female here with a history of abdominal pain and recurrent diverticulitis.  The patient was seen by me in June after a bout of diverticulitis and in June she had reported that she was feeling better at that time.  The patient was also reporting constipation when she last saw me and was recommended to start [place].  The patient reports that the Linzess was very expensive and she could not take it.  She has been taking 2 different stool softeners and fiber and reports that she has intermittent results with bowel movement sometimes every other day and sometimes every day.  She also reports that she feels better after she moves her bowels but since she does not a regular bowel movement she has more lower abdominal pain and she would like.  The patient had a colonoscopy in 2019 with the recommendation to have a repeat colonoscopy in 5 years.  Past Medical History:  Diagnosis Date   Anxiety    Arthritis    lower back   Basal cell carcinoma 11/2011   face, left leg-using chemo cream on temple area   Coronary artery disease 2005   LAD PCI with Cypher drug-eluting stent placement 3.0 x 13 mm with balloon angioplasty of diagonal.  Cardiac catheterization in 2010 showed patent stent with minimal restenosis.   GERD (gastroesophageal reflux disease)    Heart disease    Heart problem    98% blockage   Hypercholesteremia    Hypertension    Hypothyroidism    Kidney stone    Osteoarthritis    PONV (postoperative nausea and vomiting)    after 1 procedure approx 10 yrs ago   Sleep apnea    mild, could not tol cpap   Squamous cell carcinoma of skin    leg    Current Outpatient Medications  Medication Sig Dispense Refill   aspirin 81 MG tablet Take 81 mg by mouth daily.     augmented  betamethasone dipropionate (DIPROLENE-AF) 0.05 % cream Apply 1 application topically as needed. For red bumps     clopidogrel (PLAVIX) 75 MG tablet Take 1 tablet (75 mg total) by mouth daily with breakfast. 90 tablet 3   COVID-19 mRNA bivalent vaccine, Pfizer, (PFIZER COVID-19 VAC BIVALENT) injection Inject into the muscle. 0.3 mL 0   cyclobenzaprine (FLEXERIL) 5 MG tablet TAKE ONE TABLET BY MOUTH THREE TIMES A DAY AS NEEDED FOR MUSCLE SPASMS 30 tablet 0   diclofenac Sodium (VOLTAREN) 1 % GEL Apply topically as needed.     ezetimibe (ZETIA) 10 MG tablet Take 1 tablet (10 mg total) by mouth daily. 90 tablet 3   influenza vaccine adjuvanted (FLUAD QUADRIVALENT) 0.5 ML injection Inject into the muscle. 0.5 mL 0   levothyroxine (SYNTHROID) 75 MCG tablet TAKE ONE TABLET BY MOUTH DAILY BEFORE BREAKFAST 90 tablet 3   losartan (COZAAR) 100 MG tablet Take 1 tablet (100 mg total) by mouth daily. 90 tablet 3   metoprolol tartrate (LOPRESSOR) 25 MG tablet Take 1 tablet (25 mg total) by mouth 2 (two) times daily. 180 tablet 3   Multiple Vitamins-Minerals (MULTIVITAMIN PO) Take by mouth daily.     rosuvastatin (CRESTOR) 10 MG tablet Take 1 tablet (10 mg total) by mouth daily. 90 tablet 3   traZODone (DESYREL) 50 MG  tablet Take 0.5-1 tablets (25-50 mg total) by mouth at bedtime as needed for sleep. 30 tablet 5   Vitamin D, Ergocalciferol, (DRISDOL) 1.25 MG (50000 UNIT) CAPS capsule TAKE ONE CAPSULE BY MOUTH EVERY 14 DAYS 6 capsule 3   No current facility-administered medications for this visit.    Allergies as of 12/11/2020 - Review Complete 12/11/2020  Allergen Reaction Noted   Atorvastatin Other (See Comments) 12/04/2014   Codeine Shortness Of Breath 08/10/2014   Ace inhibitors Cough 12/04/2014   Enalapril Cough 12/04/2014   Penicillins Hives 12/14/2012   Sulfa antibiotics Hives and Swelling 12/14/2012   Adhesive [tape] Rash 12/14/2012    ROS:  General: Negative for anorexia, weight loss, fever,  chills, fatigue, weakness. ENT: Negative for hoarseness, difficulty swallowing , nasal congestion. CV: Negative for chest pain, angina, palpitations, dyspnea on exertion, peripheral edema.  Respiratory: Negative for dyspnea at rest, dyspnea on exertion, cough, sputum, wheezing.  GI: See history of present illness. GU:  Negative for dysuria, hematuria, urinary incontinence, urinary frequency, nocturnal urination.  Endo: Negative for unusual weight change.    Physical Examination:   BP (!) 166/75   Pulse (!) 56   Temp 98.4 F (36.9 C) (Oral)   Wt 221 lb 6.4 oz (100.4 kg)   LMP 01/28/2007 (Approximate)   BMI 40.49 kg/m   General: Well-nourished, well-developed in no acute distress.  Eyes: No icterus. Conjunctivae pink. Neuro: Alert and oriented x 3.  Grossly intact. Skin: Warm and dry, no jaundice.   Psych: Alert and cooperative, normal mood and affect.  Labs:    Imaging Studies: No results found.  Assessment and Plan:   [name] is a 70 y.o. y/o female who comes in today with a history of constipation and was not able to get the Linzess because it was expensive.  The patient had her pulmonary checked and it appears that Amitiza may be covered under her insurance plan.  The patient will be given a prescription for Amitiza 24 g twice a day for her constipation.  The patient has been told that if this causes her to have diarrhea we can go to a low-dose.  The patient will also be set up for a colonoscopy due to her recent diagnosis of diverticulitis and her last colonoscopy was 3 years ago.  This is to rule out any other pathology that may be mimicking her diverticulitis. The patient has been explained the plan agrees with it.     Lucilla Lame, MD. Marval Regal    Note: This dictation was prepared with Dragon dictation along with smaller phrase technology. Any transcriptional errors that result from this process are unintentional.

## 2020-12-11 NOTE — Progress Notes (Signed)
Primary Care Physician: Virginia Crews, MD  Primary Gastroenterologist:  Dr. Lucilla Lame  Chief Complaint  Patient presents with   Abdominal Pain    HPI: Olivia Dougherty is a 70 y.o. female here with a history of abdominal pain and recurrent diverticulitis.  The patient was seen by me in June after a bout of diverticulitis and in June she had reported that she was feeling better at that time.  The patient was also reporting constipation when she last saw me and was recommended to start [place].  The patient reports that the Linzess was very expensive and she could not take it.  She has been taking 2 different stool softeners and fiber and reports that she has intermittent results with bowel movement sometimes every other day and sometimes every day.  She also reports that she feels better after she moves her bowels but since she does not a regular bowel movement she has more lower abdominal pain and she would like.  The patient had a colonoscopy in 2019 with the recommendation to have a repeat colonoscopy in 5 years.  Past Medical History:  Diagnosis Date   Anxiety    Arthritis    lower back   Basal cell carcinoma 11/2011   face, left leg-using chemo cream on temple area   Coronary artery disease 2005   LAD PCI with Cypher drug-eluting stent placement 3.0 x 13 mm with balloon angioplasty of diagonal.  Cardiac catheterization in 2010 showed patent stent with minimal restenosis.   GERD (gastroesophageal reflux disease)    Heart disease    Heart problem    98% blockage   Hypercholesteremia    Hypertension    Hypothyroidism    Kidney stone    Osteoarthritis    PONV (postoperative nausea and vomiting)    after 1 procedure approx 10 yrs ago   Sleep apnea    mild, could not tol cpap   Squamous cell carcinoma of skin    leg    Current Outpatient Medications  Medication Sig Dispense Refill   aspirin 81 MG tablet Take 81 mg by mouth daily.     augmented  betamethasone dipropionate (DIPROLENE-AF) 0.05 % cream Apply 1 application topically as needed. For red bumps     clopidogrel (PLAVIX) 75 MG tablet Take 1 tablet (75 mg total) by mouth daily with breakfast. 90 tablet 3   COVID-19 mRNA bivalent vaccine, Pfizer, (PFIZER COVID-19 VAC BIVALENT) injection Inject into the muscle. 0.3 mL 0   cyclobenzaprine (FLEXERIL) 5 MG tablet TAKE ONE TABLET BY MOUTH THREE TIMES A DAY AS NEEDED FOR MUSCLE SPASMS 30 tablet 0   diclofenac Sodium (VOLTAREN) 1 % GEL Apply topically as needed.     ezetimibe (ZETIA) 10 MG tablet Take 1 tablet (10 mg total) by mouth daily. 90 tablet 3   influenza vaccine adjuvanted (FLUAD QUADRIVALENT) 0.5 ML injection Inject into the muscle. 0.5 mL 0   levothyroxine (SYNTHROID) 75 MCG tablet TAKE ONE TABLET BY MOUTH DAILY BEFORE BREAKFAST 90 tablet 3   losartan (COZAAR) 100 MG tablet Take 1 tablet (100 mg total) by mouth daily. 90 tablet 3   metoprolol tartrate (LOPRESSOR) 25 MG tablet Take 1 tablet (25 mg total) by mouth 2 (two) times daily. 180 tablet 3   Multiple Vitamins-Minerals (MULTIVITAMIN PO) Take by mouth daily.     rosuvastatin (CRESTOR) 10 MG tablet Take 1 tablet (10 mg total) by mouth daily. 90 tablet 3   traZODone (DESYREL) 50 MG  tablet Take 0.5-1 tablets (25-50 mg total) by mouth at bedtime as needed for sleep. 30 tablet 5   Vitamin D, Ergocalciferol, (DRISDOL) 1.25 MG (50000 UNIT) CAPS capsule TAKE ONE CAPSULE BY MOUTH EVERY 14 DAYS 6 capsule 3   No current facility-administered medications for this visit.    Allergies as of 12/11/2020 - Review Complete 12/11/2020  Allergen Reaction Noted   Atorvastatin Other (See Comments) 12/04/2014   Codeine Shortness Of Breath 08/10/2014   Ace inhibitors Cough 12/04/2014   Enalapril Cough 12/04/2014   Penicillins Hives 12/14/2012   Sulfa antibiotics Hives and Swelling 12/14/2012   Adhesive [tape] Rash 12/14/2012    ROS:  General: Negative for anorexia, weight loss, fever,  chills, fatigue, weakness. ENT: Negative for hoarseness, difficulty swallowing , nasal congestion. CV: Negative for chest pain, angina, palpitations, dyspnea on exertion, peripheral edema.  Respiratory: Negative for dyspnea at rest, dyspnea on exertion, cough, sputum, wheezing.  GI: See history of present illness. GU:  Negative for dysuria, hematuria, urinary incontinence, urinary frequency, nocturnal urination.  Endo: Negative for unusual weight change.    Physical Examination:   BP (!) 166/75   Pulse (!) 56   Temp 98.4 F (36.9 C) (Oral)   Wt 221 lb 6.4 oz (100.4 kg)   LMP 01/28/2007 (Approximate)   BMI 40.49 kg/m   General: Well-nourished, well-developed in no acute distress.  Eyes: No icterus. Conjunctivae pink. Neuro: Alert and oriented x 3.  Grossly intact. Skin: Warm and dry, no jaundice.   Psych: Alert and cooperative, normal mood and affect.  Labs:    Imaging Studies: No results found.  Assessment and Plan:   Olivia Dougherty is a 70 y.o. y/o female who comes in today with a history of constipation and was not able to get the Linzess because it was expensive.  The patient had her pulmonary checked and it appears that Amitiza may be covered under her insurance plan.  The patient will be given a prescription for Amitiza 24 g twice a day for her constipation.  The patient has been told that if this causes her to have diarrhea we can go to a low-dose.  The patient will also be set up for a colonoscopy due to her recent diagnosis of diverticulitis and her last colonoscopy was 3 years ago.  This is to rule out any other pathology that may be mimicking her diverticulitis. The patient has been explained the plan agrees with it.     Lucilla Lame, MD. Marval Regal    Note: This dictation was prepared with Dragon dictation along with smaller phrase technology. Any transcriptional errors that result from this process are unintentional.

## 2020-12-13 ENCOUNTER — Telehealth: Payer: Self-pay | Admitting: Cardiovascular Disease

## 2020-12-13 NOTE — Telephone Encounter (Signed)
   [facility] Pre-operative Risk Assessment    Patient Name: Olivia Dougherty  DOB: February 29, 1950 MRN: [id number]  HEARTCARE STAFF:  - IMPORTANT!!!!!! Under Visit Info/Reason for Call, type in Other and utilize the format Clearance MM/DD/YY or Clearance TBD. Do not use dashes or single digits. - Please review there is not already an duplicate clearance open for this procedure. - If request is for dental extraction, please clarify the # of teeth to be extracted. - If the patient is currently at the dentist's office, call Pre-Op Callback Staff (MA/nurse) to input urgent request.  - If the patient is not currently in the dentist office, please route to the Pre-Op pool.  Request for surgical clearance:  What type of surgery is being performed? Colonoscopy   When is this surgery scheduled? 12/31/20  What type of clearance is required (medical clearance vs. Pharmacy clearance to hold med vs. Both)? both  Are there any medications that need to be held prior to surgery and how long? Plavix instructions   Practice name and name of physician performing surgery? [facility]  What is the office phone number? [phone]   7.   What is the office fax number? [phone]  8.   Anesthesia type (None, local, MAC, general) ? Not listed    Ace Gins 12/13/2020, 2:26 PM  _________________________________________________________________   (provider comments below)

## 2020-12-13 NOTE — Telephone Encounter (Signed)
   Primary Cardiologist: Kathlyn Sacramento, MD  Chart reviewed as part of pre-operative protocol coverage. Given past medical history and time since last visit, based on ACC/AHA guidelines, Olivia Dougherty would be at acceptable risk for the planned procedure without further cardiovascular testing.   Her Plavix may be held for 7 days prior to her procedure.  Please resume as soon as hemostasis is achieved.  Her aspirin will need to be continued throughout her procedure.    I will route this recommendation to the requesting party via Epic fax function and remove from pre-op pool.  Please call with questions.  Jossie Ng. Fabio Wah NP-C    12/13/2020, 2:47 PM [place] [address] Office [phone] Fax [phone]

## 2020-12-17 ENCOUNTER — Ambulatory Visit (INDEPENDENT_AMBULATORY_CARE_PROVIDER_SITE_OTHER): Payer: Medicare Other | Admitting: Physician Assistant

## 2020-12-17 ENCOUNTER — Ambulatory Visit (INDEPENDENT_AMBULATORY_CARE_PROVIDER_SITE_OTHER): Payer: Medicare Other

## 2020-12-17 ENCOUNTER — Other Ambulatory Visit: Payer: Self-pay

## 2020-12-17 ENCOUNTER — Encounter: Payer: Self-pay | Admitting: Physician Assistant

## 2020-12-17 ENCOUNTER — Encounter: Payer: Self-pay | Admitting: Gastroenterology

## 2020-12-17 VITALS — BP 130/78 | HR 71 | Temp 97.9°F | Resp 20 | Ht 62.0 in | Wt 223.8 lb

## 2020-12-17 VITALS — BP 133/78 | HR 55 | Temp 97.9°F | Ht 62.0 in | Wt 223.0 lb

## 2020-12-17 DIAGNOSIS — I10 Essential (primary) hypertension: Secondary | ICD-10-CM | POA: Diagnosis not present

## 2020-12-17 DIAGNOSIS — E039 Hypothyroidism, unspecified: Secondary | ICD-10-CM

## 2020-12-17 DIAGNOSIS — E7800 Pure hypercholesterolemia, unspecified: Secondary | ICD-10-CM

## 2020-12-17 DIAGNOSIS — Z0000 Encounter for general adult medical examination without abnormal findings: Secondary | ICD-10-CM | POA: Diagnosis not present

## 2020-12-17 DIAGNOSIS — G4733 Obstructive sleep apnea (adult) (pediatric): Secondary | ICD-10-CM | POA: Diagnosis not present

## 2020-12-17 NOTE — Assessment & Plan Note (Signed)
Previously very well controlled with medication Will recheck lipid panel/cmp but if stable, f/u 1 year

## 2020-12-17 NOTE — Patient Instructions (Signed)
Olivia Dougherty , Thank you for taking time to come for your Medicare Wellness Visit. I appreciate your ongoing commitment to your health goals. Please review the following plan we discussed and let me know if I can assist you in the future.   Screening recommendations/referrals: Colonoscopy: 12/31/17  Mammogram: 12/28/19 Bone Density: 10/15/17 Recommended yearly ophthalmology/optometry visit for glaucoma screening and checkup Recommended yearly dental visit for hygiene and checkup  Vaccinations: Influenza vaccine: 11/21/20 Pneumococcal vaccine: 12/10/18 Tdap vaccine: 10/28/10 Shingles vaccine: 08/19/17, 12/09/17   Covid-19:03/31/19, 04/21/19, 11/03/19, 10/30/20   Advanced directives: declined   Conditions/risks identified:   Next appointment: Follow up in one year for your annual wellness visit    Preventive Care 21 Years and Older, Female Preventive care refers to lifestyle choices and visits with your health care provider that can promote health and wellness. What does preventive care include? A yearly physical exam. This is also called an annual well check. Dental exams once or twice a year. Routine eye exams. Ask your health care provider how often you should have your eyes checked. Personal lifestyle choices, including: Daily care of your teeth and gums. Regular physical activity. Eating a healthy diet. Avoiding tobacco and drug use. Limiting alcohol use. Practicing safe sex. Taking low-dose aspirin every day. Taking vitamin and mineral supplements as recommended by your health care provider. What happens during an annual well check? The services and screenings done by your health care provider during your annual well check will depend on your age, overall health, lifestyle risk factors, and family history of disease. Counseling  Your health care provider may ask you questions about your: Alcohol use. Tobacco use. Drug use. Emotional well-being. Home and relationship  well-being. Sexual activity. Eating habits. History of falls. Memory and ability to understand (cognition). Work and work Statistician. Reproductive health. Screening  You may have the following tests or measurements: Height, weight, and BMI. Blood pressure. Lipid and cholesterol levels. These may be checked every 5 years, or more frequently if you are over 18 years old. Skin check. Lung cancer screening. You may have this screening every year starting at age 24 if you have a 30-pack-year history of smoking and currently smoke or have quit within the past 15 years. Fecal occult blood test (FOBT) of the stool. You may have this test every year starting at age 70. Flexible sigmoidoscopy or colonoscopy. You may have a sigmoidoscopy every 5 years or a colonoscopy every 10 years starting at age 55. Hepatitis C blood test. Hepatitis B blood test. Sexually transmitted disease (STD) testing. Diabetes screening. This is done by checking your blood sugar (glucose) after you have not eaten for a while (fasting). You may have this done every 1-3 years. Bone density scan. This is done to screen for osteoporosis. You may have this done starting at age 16. Mammogram. This may be done every 1-2 years. Talk to your health care provider about how often you should have regular mammograms. Talk with your health care provider about your test results, treatment options, and if necessary, the need for more tests. Vaccines  Your health care provider may recommend certain vaccines, such as: Influenza vaccine. This is recommended every year. Tetanus, diphtheria, and acellular pertussis (Tdap, Td) vaccine. You may need a Td booster every 10 years. Zoster vaccine. You may need this after age 29. Pneumococcal 13-valent conjugate (PCV13) vaccine. One dose is recommended after age 5. Pneumococcal polysaccharide (PPSV23) vaccine. One dose is recommended after age 23. Talk to your health care  provider about which  screenings and vaccines you need and how often you need them. This information is not intended to replace advice given to you by your health care provider. Make sure you discuss any questions you have with your health care provider. Document Released: 02/09/2015 Document Revised: 10/03/2015 Document Reviewed: 11/14/2014 Elsevier Interactive Patient Education  2017 [place] Prevention in the Home Falls can cause injuries. They can happen to people of all ages. There are many things you can do to make your home safe and to help prevent falls. What can I do on the outside of my home? Regularly fix the edges of walkways and driveways and fix any cracks. Remove anything that might make you trip as you walk through a door, such as a raised step or threshold. Trim any bushes or trees on the path to your home. Use bright outdoor lighting. Clear any walking paths of anything that might make someone trip, such as rocks or tools. Regularly check to see if handrails are loose or broken. Make sure that both sides of any steps have handrails. Any raised decks and porches should have guardrails on the edges. Have any leaves, snow, or ice cleared regularly. Use sand or salt on walking paths during winter. Clean up any spills in your garage right away. This includes oil or grease spills. What can I do in the bathroom? Use night lights. Install grab bars by the toilet and in the tub and shower. Do not use towel bars as grab bars. Use non-skid mats or decals in the tub or shower. If you need to sit down in the shower, use a plastic, non-slip stool. Keep the floor dry. Clean up any water that spills on the floor as soon as it happens. Remove soap buildup in the tub or shower regularly. Attach bath mats securely with double-sided non-slip rug tape. Do not have throw rugs and other things on the floor that can make you trip. What can I do in the bedroom? Use night lights. Make sure that you have a  light by your bed that is easy to reach. Do not use any sheets or blankets that are too big for your bed. They should not hang down onto the floor. Have a firm chair that has side arms. You can use this for support while you get dressed. Do not have throw rugs and other things on the floor that can make you trip. What can I do in the kitchen? Clean up any spills right away. Avoid walking on wet floors. Keep items that you use a lot in easy-to-reach places. If you need to reach something above you, use a strong step stool that has a grab bar. Keep electrical cords out of the way. Do not use floor polish or wax that makes floors slippery. If you must use wax, use non-skid floor wax. Do not have throw rugs and other things on the floor that can make you trip. What can I do with my stairs? Do not leave any items on the stairs. Make sure that there are handrails on both sides of the stairs and use them. Fix handrails that are broken or loose. Make sure that handrails are as long as the stairways. Check any carpeting to make sure that it is firmly attached to the stairs. Fix any carpet that is loose or worn. Avoid having throw rugs at the top or bottom of the stairs. If you do have throw rugs, attach them to the  floor with carpet tape. Make sure that you have a light switch at the top of the stairs and the bottom of the stairs. If you do not have them, ask someone to add them for you. What else can I do to help prevent falls? Wear shoes that: Do not have high heels. Have rubber bottoms. Are comfortable and fit you well. Are closed at the toe. Do not wear sandals. If you use a stepladder: Make sure that it is fully opened. Do not climb a closed stepladder. Make sure that both sides of the stepladder are locked into place. Ask someone to hold it for you, if possible. Clearly mark and make sure that you can see: Any grab bars or handrails. First and last steps. Where the edge of each step  is. Use tools that help you move around (mobility aids) if they are needed. These include: Canes. Walkers. Scooters. Crutches. Turn on the lights when you go into a dark area. Replace any light bulbs as soon as they burn out. Set up your furniture so you have a clear path. Avoid moving your furniture around. If any of your floors are uneven, fix them. If there are any pets around you, be aware of where they are. Review your medicines with your doctor. Some medicines can make you feel dizzy. This can increase your chance of falling. Ask your doctor what other things that you can do to help prevent falls. This information is not intended to replace advice given to you by your health care provider. Make sure you discuss any questions you have with your health care provider. Document Released: 11/09/2008 Document Revised: 06/21/2015 Document Reviewed: 02/17/2014 Elsevier Interactive Patient Education  2017 [employer].

## 2020-12-17 NOTE — Assessment & Plan Note (Signed)
BMI > 40 currently We discussed how higher BMIs contribute more to OSA  Pt aware of need for weight loss, difficulty with exercise lately as still healing from ingrown toenail removal

## 2020-12-17 NOTE — Progress Notes (Signed)
Complete physical exam   Patient: Olivia Dougherty   DOB: Mar 28, 1950   70 y.o. Female  MRN: [id number] Visit Date: 12/17/2020  Today's healthcare provider: Mikey Kirschner, PA-C   Cc. CPE  Subjective    Olivia Dougherty is a 70 y.o. female who presents today for a complete physical exam.  She reports consuming a general diet. The patient does not participate in regular exercise at present. Maybe once a week for 30 minutes if she can.  She generally feels well. She reports sleeping well. She does not have additional problems to discuss today.  She feels more fatigued than usual over the last few months, but thinks this may just be her baseline.  Ports sleeping well, takes trazodone maybe 1 to twice a week.  Past Medical History:  Diagnosis Date   Anxiety    Arthritis    lower back   Basal cell carcinoma 11/2011   face, left leg-using chemo cream on temple area   Coronary artery disease 2005   LAD PCI with Cypher drug-eluting stent placement 3.0 x 13 mm with balloon angioplasty of diagonal.  Cardiac catheterization in 2010 showed patent stent with minimal restenosis.   GERD (gastroesophageal reflux disease)    Heart disease    Heart problem    98% blockage   Hypercholesteremia    Hypertension    Hypothyroidism    Kidney stone    Osteoarthritis    PONV (postoperative nausea and vomiting)    after 1 procedure approx 10 yrs ago   Sleep apnea    mild, could not tol cpap   Squamous cell carcinoma of skin    leg   Past Surgical History:  Procedure Laterality Date   ANGIOPLASTY  2005   CAD   98% blockage   CARDIAC CATHETERIZATION  02/2008   CATARACT EXTRACTION Right    CATARACT EXTRACTION W/PHACO Right 08/31/2019   Procedure: CATARACT EXTRACTION PHACO AND INTRAOCULAR LENS PLACEMENT ([place]) RIGHT;  Surgeon: Eulogio Bear, MD;  Location: [place] [facility];  Service: Ophthalmology;  Laterality: Right;  4.41 0:30.7   CATARACT EXTRACTION W/PHACO Left 09/26/2019    Procedure: CATARACT EXTRACTION PHACO AND INTRAOCULAR LENS PLACEMENT ([facility]) LEFT 1.60 00:20.3;  Surgeon: Eulogio Bear, MD;  Location: [place];  Service: Ophthalmology;  Laterality: Left;   COLONOSCOPY WITH PROPOFOL N/A 12/29/2014   Procedure: COLONOSCOPY WITH PROPOFOL;  Surgeon: Lucilla Lame, MD;  Location: [place];  Service: Endoscopy;  Laterality: N/A;   COLONOSCOPY WITH PROPOFOL N/A 12/31/2017   Procedure: COLONOSCOPY WITH PROPOFOL;  Surgeon: Lucilla Lame, MD;  Location: [place];  Service: Endoscopy;  Laterality: N/A;  sleep apnea   CORONARY ANGIOPLASTY  2005   LAD   EYE SURGERY Right    HYSTEROSCOPY  11/2001   resection of benign leiomyoma   KNEE ARTHROSCOPY Right 08/16/2014   Procedure: ARTHROSCOPY KNEE, partial lateral menisectomy and chondroplasty;  Surgeon: Dereck Leep, MD;  Location: [facility];  Service: Orthopedics;  Laterality: Right;   POLYPECTOMY  12/29/2014   Procedure: POLYPECTOMY;  Surgeon: Lucilla Lame, MD;  Location: [place];  Service: Endoscopy;;   SKIN CANCER EXCISION Right 02/2018   Right leg - Squamous    THYROID CYST EXCISION Left 10/2002   left lobe thyroid excision   THYROIDECTOMY, PARTIAL Left    TUBAL LIGATION  [address]   [place]  Social History   Socioeconomic History   Marital status: Married    Spouse name: Ronnie   Number of children: 3   Years of education: 12   Highest education level: High school graduate  Occupational History   Occupation: retired  Tobacco Use   Smoking status: Never   Smokeless tobacco: Never  Vaping Use   Vaping Use: Never used  Substance and Sexual Activity   Alcohol use: Yes    Alcohol/week: 2.0 standard drinks    Types: 2 Glasses of wine per week    Comment: weekends   Drug use: No   Sexual activity: Yes    Partners: Male    Birth control/protection: Surgical  Other Topics Concern   Not on file  Social  History Narrative   2 living children; one son deceased from overdose.   Social Determinants of Health   Financial Resource Strain: Low Risk    Difficulty of Paying Living Expenses: Not hard at all  Food Insecurity: No Food Insecurity   Worried About Charity fundraiser in the Last Year: Never true   [place] in the Last Year: Never true  Transportation Needs: No Transportation Needs   Lack of Transportation (Medical): No   Lack of Transportation (Non-Medical): No  Physical Activity: Insufficiently Active   Days of Exercise per Week: 1 day   Minutes of Exercise per Session: 10 min  Stress: No Stress Concern Present   Feeling of Stress : Not at all  Social Connections: Moderately Isolated   Frequency of Communication with Friends and Family: Twice a week   Frequency of Social Gatherings with Friends and Family: Once a week   Attends Religious Services: Never   Marine scientist or Organizations: No   Attends Music therapist: Never   Marital Status: Married  Human resources officer Violence: Not At Risk   Fear of Current or Ex-Partner: No   Emotionally Abused: No   Physically Abused: No   Sexually Abused: No   Family Status  Relation Name Status   Mother  Deceased   Father  Deceased   Sister  Deceased   Son  Deceased       died from an OD   Son  Alive   Son  Alive   Neg Hx  (Not Specified)   Family History  Problem Relation Age of Onset   Hypertension Mother    Hypertension Father    Diabetes Father    Heart disease Father    Cervical cancer Sister    Stroke Sister    Colon cancer Neg Hx    Breast cancer Neg Hx    Ovarian cancer Neg Hx    Allergies  Allergen Reactions   Atorvastatin Other (See Comments)    Other reaction(s): myalgia   Codeine Shortness Of Breath   Ace Inhibitors Cough        Enalapril Cough   Penicillins Hives        Sulfa Antibiotics Hives and Swelling        Adhesive [Tape] Rash         Patient Care  Team: Virginia Crews, MD as PCP - General (Family Medicine) Wellington Hampshire, MD as PCP - Cardiology (Cardiology) Lucilla Lame, MD as Consulting Physician (Gastroenterology) Oneta Rack, MD (Dermatology) Eulogio Bear, MD as Consulting Physician (Ophthalmology) Sherlynn Stalls, MD as Consulting Physician (Ophthalmology) Clyde Canterbury, MD as Referring Physician (Otolaryngology)   Medications: Outpatient Medications Prior to  Visit  Medication Sig   aspirin 81 MG tablet Take 81 mg by mouth daily.   augmented betamethasone dipropionate (DIPROLENE-AF) 0.05 % cream Apply 1 application topically as needed. For red bumps   clopidogrel (PLAVIX) 75 MG tablet Take 1 tablet (75 mg total) by mouth daily with breakfast.   COVID-19 mRNA bivalent vaccine, Pfizer, (PFIZER COVID-19 VAC BIVALENT) injection Inject into the muscle.   cyclobenzaprine (FLEXERIL) 5 MG tablet TAKE ONE TABLET BY MOUTH THREE TIMES A DAY AS NEEDED FOR MUSCLE SPASMS   diclofenac Sodium (VOLTAREN) 1 % GEL Apply topically as needed.   ezetimibe (ZETIA) 10 MG tablet Take 1 tablet (10 mg total) by mouth daily.   influenza vaccine adjuvanted (FLUAD QUADRIVALENT) 0.5 ML injection Inject into the muscle.   levothyroxine (SYNTHROID) 75 MCG tablet TAKE ONE TABLET BY MOUTH DAILY BEFORE BREAKFAST   losartan (COZAAR) 100 MG tablet Take 1 tablet (100 mg total) by mouth daily.   lubiprostone (AMITIZA) 24 MCG capsule Take 1 capsule (24 mcg total) by mouth 2 (two) times daily with a meal.   metoprolol tartrate (LOPRESSOR) 25 MG tablet Take 1 tablet (25 mg total) by mouth 2 (two) times daily.   Multiple Vitamins-Minerals (MULTIVITAMIN PO) Take by mouth daily.   rosuvastatin (CRESTOR) 10 MG tablet Take 1 tablet (10 mg total) by mouth daily.   traZODone (DESYREL) 50 MG tablet Take 0.5-1 tablets (25-50 mg total) by mouth at bedtime as needed for sleep.   Vitamin D, Ergocalciferol, (DRISDOL) 1.25 MG (50000 UNIT) CAPS capsule TAKE ONE  CAPSULE BY MOUTH EVERY 14 DAYS   No facility-administered medications prior to visit.    Review of Systems  Constitutional:  Positive for fatigue. Negative for fever.  Eyes: Negative.   Respiratory:  Negative for cough, shortness of breath and wheezing.   Cardiovascular: Negative.  Negative for chest pain.  Gastrointestinal: Negative.   Endocrine: Negative.   Genitourinary: Negative.   Musculoskeletal: Negative.   Skin: Negative.   Allergic/Immunologic: Negative.   Neurological: Negative.  Negative for dizziness and weakness.  Hematological: Negative.   Psychiatric/Behavioral: Negative.  Negative for sleep disturbance.   All other systems reviewed and are negative.  Last CBC Lab Results  Component Value Date   WBC 8.7 06/01/2020   HGB 15.0 06/01/2020   HCT 44.3 06/01/2020   MCV 93.7 06/01/2020   MCH 31.7 06/01/2020   RDW 11.9 06/01/2020   PLT 209 02/63/7858   Last metabolic panel Lab Results  Component Value Date   GLUCOSE 108 (H) 06/01/2020   NA 140 06/01/2020   K 3.9 06/01/2020   CL 104 06/01/2020   CO2 27 06/01/2020   BUN 16 06/01/2020   CREATININE 0.82 06/01/2020   GFRNONAA >60 06/01/2020   CALCIUM 9.3 06/01/2020   PROT 7.5 06/01/2020   ALBUMIN 4.4 06/01/2020   LABGLOB 2.3 12/12/2019   AGRATIO 1.8 12/12/2019   BILITOT 0.7 06/01/2020   ALKPHOS 76 06/01/2020   AST 29 06/01/2020   ALT 21 06/01/2020   ANIONGAP 9 06/01/2020   Last lipids Lab Results  Component Value Date   CHOL 115 06/12/2020   HDL 56 06/12/2020   LDLCALC 43 06/12/2020   TRIG 80 06/12/2020   CHOLHDL 2.1 06/12/2020   Last hemoglobin A1c No results found for: HGBA1C Last thyroid functions Lab Results  Component Value Date   TSH 3.420 06/12/2020   Last vitamin D Lab Results  Component Value Date   VD25OH 51.6 06/12/2020   Last vitamin B12 and  Folate Lab Results  Component Value Date   VITAMINB12 486 06/12/2020      Objective    BP 133/78 (BP Location: Left Arm, Patient  Position: Sitting, Cuff Size: Large)   Pulse (!) 55   Temp 97.9 F (36.6 C) (Oral)   Ht 5\' 2"  (1.575 m)   Wt 223 lb (101.2 kg)   LMP 01/28/2007 (Approximate)   SpO2 100%   BMI 40.79 kg/m  BP Readings from Last 3 Encounters:  12/17/20 133/78  12/17/20 130/78  12/11/20 (!) 166/75   Wt Readings from Last 3 Encounters:  12/17/20 223 lb (101.2 kg)  12/17/20 223 lb 12.8 oz (101.5 kg)  12/11/20 221 lb 6.4 oz (100.4 kg)      Physical Exam Constitutional:      General: She is awake.     Appearance: She is well-developed.  HENT:     Head: Normocephalic.     Right Ear: Tympanic membrane normal.     Left Ear: Tympanic membrane normal.  Eyes:     Conjunctiva/sclera: Conjunctivae normal.     Pupils: Pupils are equal, round, and reactive to light.  Neck:     Thyroid: No thyroid mass or thyromegaly.  Cardiovascular:     Rate and Rhythm: Normal rate and regular rhythm.     Heart sounds: Normal heart sounds.  Pulmonary:     Effort: Pulmonary effort is normal.     Breath sounds: Normal breath sounds.  Abdominal:     Palpations: Abdomen is soft.     Tenderness: There is no abdominal tenderness.  Musculoskeletal:     Right lower leg: No swelling.     Left lower leg: No swelling.  Lymphadenopathy:     Cervical: No cervical adenopathy.  Skin:    General: Skin is warm.  Neurological:     Mental Status: She is alert and oriented to person, place, and time.  Psychiatric:        Attention and Perception: Attention normal.        Mood and Affect: Mood normal.        Speech: Speech normal.        Behavior: Behavior is cooperative.    Last depression screening scores PHQ 2/9 Scores 12/17/2020 12/17/2020 12/17/2020  PHQ - 2 Score 0 0 0  PHQ- 9 Score 1 - -   Last fall risk screening Fall Risk  12/17/2020  Falls in the past year? 0  Number falls in past yr: 0  Injury with Fall? 0  Risk for fall due to : No Fall Risks  Follow up -   Last Audit-C alcohol use screening Alcohol  Use Disorder Test (AUDIT) 12/17/2020  1. How often do you have a drink containing alcohol? 0  2. How many drinks containing alcohol do you have on a typical day when you are drinking? 0  3. How often do you have six or more drinks on one occasion? 0  AUDIT-C Score 0  Alcohol Brief Interventions/Follow-up -   A score of 3 or more in women, and 4 or more in men indicates increased risk for alcohol abuse, EXCEPT if all of the points are from question 1   No results found for any visits on 12/17/20.  Assessment & Plan    Routine Health Maintenance and Physical Exam  Exercise Activities and Dietary recommendations  Goals      DIET - DECREASE SODA OR JUICE INTAKE     Recommend to continue cutting back on soda  in take to 1 or less a day.      DIET - EAT MORE FRUITS AND VEGETABLES     Exercise 3x per week (30 min per time)     Recommend to exercise for 3 days a week for at least 30 minutes at a time.         Immunization History  Administered Date(s) Administered   Fluad Quad(high Dose 65+) 10/06/2018, 11/02/2019, 11/21/2020   Influenza, High Dose Seasonal PF 10/21/2017   Influenza-Unspecified 12/27/2014, 10/28/2015   PFIZER Comirnaty(Gray Top)Covid-19 Tri-Sucrose Vaccine 05/02/2020   PFIZER(Purple Top)SARS-COV-2 Vaccination 03/31/2019, 04/21/2019, 11/03/2019   Pfizer Covid-19 Vaccine Bivalent Booster 70yrs & up 10/30/2020   Pneumococcal Conjugate-13 08/10/2017   Pneumococcal Polysaccharide-23 12/10/2018   Tdap 10/28/2010   Zoster Recombinat (Shingrix) 08/19/2017, 12/09/2017    Health Maintenance  Topic Date Due   TETANUS/TDAP  10/27/2020   MAMMOGRAM  12/27/2020   COLONOSCOPY (Pts 45-37yrs Insurance coverage will need to be confirmed)  01/01/2023   Pneumonia Vaccine 63+ Years old  Completed   INFLUENZA VACCINE  Completed   DEXA SCAN  Completed   COVID-19 Vaccine  Completed   Hepatitis C Screening  Completed   Zoster Vaccines- Shingrix  Completed   HPV VACCINES  Aged Out     Discussed health benefits of physical activity, and encouraged her to engage in regular exercise appropriate for her age and condition.  Problem List Items Addressed This Visit       Cardiovascular and Mediastinum   Essential hypertension    Well controlled, continue current medications. Can f/u 6 mo.       Relevant Orders   Comprehensive metabolic panel     Respiratory   OSA (obstructive sleep apnea)    Discussed her history of OSA dx given fatigue.  Discussed options of OSA relief-- different masks, surgery, etc Pt not interested in sleep consultation at this time.  Advised her to keep in mind if fatigue progresses        Endocrine   Acquired hypothyroidism    Chronic and well  Controlled with medication Given fatigue will check tsh/t4      Relevant Orders   TSH + free T4     Other   Hyperlipidemia    Previously very well controlled with medication Will recheck lipid panel/cmp but if stable, f/u 1 year      Relevant Orders   Comprehensive metabolic panel   Lipid panel   Morbid obesity ([place])    BMI > 40 currently We discussed how higher BMIs contribute more to OSA  Pt aware of need for weight loss, difficulty with exercise lately as still healing from ingrown toenail removal      Other Visit Diagnoses     Encounter for physical examination    -  Primary        Return in about 6 months (around 06/16/2021) for hypertension.    I, Mikey Kirschner, PA-C have reviewed all documentation for this visit. The documentation on  12/17/2020 for the exam, diagnosis, procedures, and orders are all accurate and complete.    Mikey Kirschner, PA-C  [facility] [phone] (phone) [phone] (fax)  [place]

## 2020-12-17 NOTE — Assessment & Plan Note (Signed)
Chronic and well  Controlled with medication Given fatigue will check tsh/t4

## 2020-12-17 NOTE — Assessment & Plan Note (Signed)
Well controlled, continue current medications. Can f/u 6 mo.

## 2020-12-17 NOTE — Assessment & Plan Note (Signed)
Discussed her history of OSA dx given fatigue.  Discussed options of OSA relief-- different masks, surgery, etc Pt not interested in sleep consultation at this time.  Advised her to keep in mind if fatigue progresses

## 2020-12-17 NOTE — Progress Notes (Signed)
Subjective:   Olivia Dougherty is a 70 y.o. female who presents for Medicare Annual (Subsequent) preventive examination.  Review of Systems     Cardiac Risk Factors include: advanced age (>26men, >78 women)     Objective:    Today's Vitals   12/17/20 1018  BP: 130/78  Pulse: 71  Resp: 20  Temp: 97.9 F (36.6 C)  TempSrc: Oral  SpO2: 97%  Weight: 223 lb 12.8 oz (101.5 kg)  Height: 5\' 2"  (1.575 m)   Body mass index is 40.93 kg/m.  Advanced Directives 12/17/2020 11/03/2020 06/01/2020 12/12/2019 09/26/2019 08/31/2019 12/07/2018  Does Patient Have a Medical Advance Directive? No No No Yes Yes No Yes  Type of Advance Directive - - Public librarian;Living will [place];Living will - [place];Living will  Does patient want to make changes to medical advance directive? - - - - - - -  Copy of [place] in Chart? - - - No - copy requested No - copy requested - No - copy requested  Would patient like information on creating a medical advance directive? No - Patient declined - - - - No - Patient declined -    Current Medications (verified) Outpatient Encounter Medications as of 12/17/2020  Medication Sig   aspirin 81 MG tablet Take 81 mg by mouth daily.   augmented betamethasone dipropionate (DIPROLENE-AF) 0.05 % cream Apply 1 application topically as needed. For red bumps   clopidogrel (PLAVIX) 75 MG tablet Take 1 tablet (75 mg total) by mouth daily with breakfast.   COVID-19 mRNA bivalent vaccine, Pfizer, (PFIZER COVID-19 VAC BIVALENT) injection Inject into the muscle.   cyclobenzaprine (FLEXERIL) 5 MG tablet TAKE ONE TABLET BY MOUTH THREE TIMES A DAY AS NEEDED FOR MUSCLE SPASMS   diclofenac Sodium (VOLTAREN) 1 % GEL Apply topically as needed.   ezetimibe (ZETIA) 10 MG tablet Take 1 tablet (10 mg total) by mouth daily.   influenza vaccine adjuvanted (FLUAD QUADRIVALENT) 0.5 ML injection Inject into the  muscle.   levothyroxine (SYNTHROID) 75 MCG tablet TAKE ONE TABLET BY MOUTH DAILY BEFORE BREAKFAST   losartan (COZAAR) 100 MG tablet Take 1 tablet (100 mg total) by mouth daily.   lubiprostone (AMITIZA) 24 MCG capsule Take 1 capsule (24 mcg total) by mouth 2 (two) times daily with a meal.   metoprolol tartrate (LOPRESSOR) 25 MG tablet Take 1 tablet (25 mg total) by mouth 2 (two) times daily.   Multiple Vitamins-Minerals (MULTIVITAMIN PO) Take by mouth daily.   rosuvastatin (CRESTOR) 10 MG tablet Take 1 tablet (10 mg total) by mouth daily.   traZODone (DESYREL) 50 MG tablet Take 0.5-1 tablets (25-50 mg total) by mouth at bedtime as needed for sleep.   Vitamin D, Ergocalciferol, (DRISDOL) 1.25 MG (50000 UNIT) CAPS capsule TAKE ONE CAPSULE BY MOUTH EVERY 14 DAYS   No facility-administered encounter medications on file as of 12/17/2020.    Allergies (verified) Atorvastatin, Codeine, Ace inhibitors, Enalapril, Penicillins, Sulfa antibiotics, and Adhesive [tape]   History: Past Medical History:  Diagnosis Date   Anxiety    Arthritis    lower back   Basal cell carcinoma 11/2011   face, left leg-using chemo cream on temple area   Coronary artery disease 2005   LAD PCI with Cypher drug-eluting stent placement 3.0 x 13 mm with balloon angioplasty of diagonal.  Cardiac catheterization in 2010 showed patent stent with minimal restenosis.   GERD (gastroesophageal reflux disease)  Heart disease    Heart problem    98% blockage   Hypercholesteremia    Hypertension    Hypothyroidism    Kidney stone    Osteoarthritis    PONV (postoperative nausea and vomiting)    after 1 procedure approx 10 yrs ago   Sleep apnea    mild, could not tol cpap   Squamous cell carcinoma of skin    leg   Past Surgical History:  Procedure Laterality Date   ANGIOPLASTY  2005   CAD   98% blockage   CARDIAC CATHETERIZATION  02/2008   CATARACT EXTRACTION Right    CATARACT EXTRACTION W/PHACO Right 08/31/2019    Procedure: CATARACT EXTRACTION PHACO AND INTRAOCULAR LENS PLACEMENT ([place]) RIGHT;  Surgeon: Eulogio Bear, MD;  Location: [place];  Service: Ophthalmology;  Laterality: Right;  4.41 0:30.7   CATARACT EXTRACTION W/PHACO Left 09/26/2019   Procedure: CATARACT EXTRACTION PHACO AND INTRAOCULAR LENS PLACEMENT ([facility]) LEFT 1.60 00:20.3;  Surgeon: Eulogio Bear, MD;  Location: [place];  Service: Ophthalmology;  Laterality: Left;   COLONOSCOPY WITH PROPOFOL N/A 12/29/2014   Procedure: COLONOSCOPY WITH PROPOFOL;  Surgeon: Lucilla Lame, MD;  Location: [place];  Service: Endoscopy;  Laterality: N/A;   COLONOSCOPY WITH PROPOFOL N/A 12/31/2017   Procedure: COLONOSCOPY WITH PROPOFOL;  Surgeon: Lucilla Lame, MD;  Location: [place];  Service: Endoscopy;  Laterality: N/A;  sleep apnea   CORONARY ANGIOPLASTY  2005   LAD   EYE SURGERY Right    HYSTEROSCOPY  11/2001   resection of benign leiomyoma   KNEE ARTHROSCOPY Right 08/16/2014   Procedure: ARTHROSCOPY KNEE, partial lateral menisectomy and chondroplasty;  Surgeon: Dereck Leep, MD;  Location: [facility];  Service: Orthopedics;  Laterality: Right;   POLYPECTOMY  12/29/2014   Procedure: POLYPECTOMY;  Surgeon: Lucilla Lame, MD;  Location: [place];  Service: Endoscopy;;   SKIN CANCER EXCISION Right 02/2018   Right leg - Squamous    THYROID CYST EXCISION Left 10/2002   left lobe thyroid excision   THYROIDECTOMY, PARTIAL Left    TUBAL LIGATION  5732   BTL   UMBILICAL HERNIA REPAIR  1980   vulvar cancer  1993   laser   Family History  Problem Relation Age of Onset   Hypertension Mother    Hypertension Father    Diabetes Father    Heart disease Father    Cervical cancer Sister    Stroke Sister    Colon cancer Neg Hx    Breast cancer Neg Hx    Ovarian cancer Neg Hx    Social History   Socioeconomic History   Marital status: Married    Spouse name: Ronnie   Number of children: 3   Years  of education: 12   Highest education level: High school graduate  Occupational History   Occupation: retired  Tobacco Use   Smoking status: Never   Smokeless tobacco: Never  Vaping Use   Vaping Use: Never used  Substance and Sexual Activity   Alcohol use: Yes    Alcohol/week: 2.0 standard drinks    Types: 2 Glasses of wine per week    Comment: weekends   Drug use: No   Sexual activity: Yes    Partners: Male    Birth control/protection: Surgical  Other Topics Concern   Not on file  Social History Narrative   2 living children; one son deceased from overdose.   Social Determinants of Health   Financial  Resource Strain: Low Risk    Difficulty of Paying Living Expenses: Not hard at all  Food Insecurity: No Food Insecurity   Worried About Charity fundraiser in the Last Year: Never true   Ran Out of Food in the Last Year: Never true  Transportation Needs: No Transportation Needs   Lack of Transportation (Medical): No   Lack of Transportation (Non-Medical): No  Physical Activity: Insufficiently Active   Days of Exercise per Week: 1 day   Minutes of Exercise per Session: 10 min  Stress: No Stress Concern Present   Feeling of Stress : Not at all  Social Connections: Moderately Isolated   Frequency of Communication with Friends and Family: Twice a week   Frequency of Social Gatherings with Friends and Family: Once a week   Attends Religious Services: Never   Marine scientist or Organizations: No   Attends Music therapist: Never   Marital Status: Married    Tobacco Counseling Counseling given: Not Answered   Clinical Intake:  Pre-visit preparation completed: Yes  Pain : No/denies pain     Nutritional Status: BMI > 30  Obese Nutritional Risks: None Diabetes: No    Interpreter Needed?: No  Information entered by :: Kirke Shaggy, LPN   Activities of Daily Living In your present state of health, do you have any difficulty performing the  following activities: 12/17/2020 12/17/2020  Hearing? N N  Vision? N N  Difficulty concentrating or making decisions? N N  Walking or climbing stairs? N N  Dressing or bathing? N N  Doing errands, shopping? N N  Preparing Food and eating ? N N  Using the Toilet? N N  In the past six months, have you accidently leaked urine? N N  Do you have problems with loss of bowel control? N N  Managing your Medications? N N  Managing your Finances? N N  Housekeeping or managing your Housekeeping? N N  Some recent data might be hidden    Patient Care Team: Bacigalupo, Dionne Bucy, MD as PCP - General (Family Medicine) Wellington [place], MD as PCP - Cardiology (Cardiology) Lucilla Lame, MD as Consulting Physician (Gastroenterology) Oneta Rack, MD (Dermatology) Eulogio Bear, MD as Consulting Physician (Ophthalmology) Sherlynn Stalls, MD as Consulting Physician (Ophthalmology) Clyde Canterbury, MD as Referring Physician (Otolaryngology)  Indicate any recent Medical Services you may have received from other than [facility] providers in the past year (date may be approximate).     Assessment:   This is a routine wellness examination for Nareh.  Hearing/Vision screen Hearing Screening - Comments:: Hearing is ok, has lost some hearing in one ear- Dr.Bennett Vision Screening - Comments:: [facility] 2021[facility]  Dietary issues and exercise activities discussed: Current Exercise Habits: Home exercise routine, Type of exercise: walking, Time (Minutes): 15, Frequency (Times/Week): 3, Weekly Exercise (Minutes/Week): 45, Intensity: Mild, Exercise limited by: cardiac condition(s)   Goals Addressed             This Visit's Progress    DIET - EAT MORE FRUITS AND VEGETABLES         Depression Screen PHQ 2/9 Scores 12/17/2020 12/17/2020 12/12/2019 12/07/2018 08/10/2017  PHQ - 2 Score 0 0 0 0 0    Fall Risk Fall Risk  12/17/2020 12/17/2020 12/12/2019  12/07/2018 08/10/2017  Falls in the past year? 0 0 0 0 No  Number falls in past yr: 0 0 0 0 -  Injury with  Fall? 0 0 0 0 -  Risk for fall due to : No Fall Risks - - - -  Follow up Falls prevention discussed - - - -    FALL RISK PREVENTION PERTAINING TO THE HOME:  Any stairs in or around the home? Yes  If so, are there any without handrails? No  Home free of loose throw rugs in walkways, pet beds, electrical cords, etc? Yes  Adequate lighting in your home to reduce risk of falls? Yes   ASSISTIVE DEVICES UTILIZED TO PREVENT FALLS:  Life alert? No  Use of a cane, walker or w/c? No  Grab bars in the bathroom? Yes  Shower chair or bench in shower? Yes  Elevated toilet seat or a handicapped toilet? Yes   TIMED UP AND GO:  Was the test performed? Yes .  Length of time to ambulate 10 feet: 4 sec.   Gait slow and steady without use of assistive device  Cognitive Function:        Immunizations Immunization History  Administered Date(s) Administered   Fluad Quad(high Dose 65+) 10/06/2018, 11/02/2019, 11/21/2020   Influenza, High Dose Seasonal PF 10/21/2017   Influenza-Unspecified 12/27/2014, 10/28/2015   PFIZER Comirnaty(Gray Top)Covid-19 Tri-Sucrose Vaccine 05/02/2020   PFIZER(Purple Top)SARS-COV-2 Vaccination 03/31/2019, 04/21/2019, 11/03/2019   Pfizer Covid-19 Vaccine Bivalent Booster 73yrs & up 10/30/2020   Pneumococcal Conjugate-13 08/10/2017   Pneumococcal Polysaccharide-23 12/10/2018   Tdap 10/28/2010   Zoster Recombinat (Shingrix) 08/19/2017, 12/09/2017    TDAP status: Due, Education has been provided regarding the importance of this vaccine. Advised may receive this vaccine at local pharmacy or Health Dept. Aware to provide a copy of the vaccination record if obtained from local pharmacy or Health Dept. Verbalized acceptance and understanding.  Flu Vaccine status: Up to date  Pneumococcal vaccine status: Up to date  Covid-19 vaccine status: Completed  vaccines  Qualifies for Shingles Vaccine? Yes   Zostavax completed No   Shingrix Completed?: Yes  Screening Tests Health Maintenance  Topic Date Due   TETANUS/TDAP  10/27/2020   MAMMOGRAM  12/27/2020   COLONOSCOPY (Pts 45-20yrs Insurance coverage will need to be confirmed)  01/01/2023   Pneumonia Vaccine 31+ Years old  Completed   INFLUENZA VACCINE  Completed   DEXA SCAN  Completed   COVID-19 Vaccine  Completed   Hepatitis C Screening  Completed   Zoster Vaccines- Shingrix  Completed   HPV VACCINES  Aged Out    Health Maintenance  Health Maintenance Due  Topic Date Due   TETANUS/TDAP  10/27/2020    Colorectal cancer screening: Type of screening: Colonoscopy. Completed 12/31/17. Repeat every 5 years  Mammogram status: Completed 12/28/19. Repeat every year has appointment on 12/28/20  Bone Density status: Completed 10/15/17. Results reflect: Bone density results: NORMAL. Repeat every 2 years.  Lung Cancer Screening: (Low Dose CT Chest recommended if Age 60-80 years, 30 pack-year currently smoking OR have quit w/in 15years.) does not qualify.    Additional Screening:  Hepatitis C Screening: does qualify; Completed 01/17/15  Vision Screening: Recommended annual ophthalmology exams for early detection of glaucoma and other disorders of the eye. Is the patient up to date with their annual eye exam?  Yes  Who is the provider or what is the name of the office in which the patient attends annual eye exams? [place] Screening: Recommended annual dental exams for proper oral hygiene  Community Resource Referral / Chronic Care Management: CRR required this visit?  No   CCM  required this visit?  No      Plan:     I have personally reviewed and noted the following in the patient's chart:   Medical and social history Use of alcohol, tobacco or illicit drugs  Current medications and supplements including opioid prescriptions.  Functional ability and  status Nutritional status Physical activity Advanced directives List of other physicians Hospitalizations, surgeries, and ER visits in previous 12 months Vitals Screenings to include cognitive, depression, and falls Referrals and appointments  In addition, I have reviewed and discussed with patient certain preventive protocols, quality metrics, and best practice recommendations. A written personalized care plan for preventive services as well as general preventive health recommendations were provided to patient.     Dionisio David, LPN   [id number]/[id number]   Nurse Notes: none

## 2020-12-18 DIAGNOSIS — I10 Essential (primary) hypertension: Secondary | ICD-10-CM | POA: Diagnosis not present

## 2020-12-18 DIAGNOSIS — E7800 Pure hypercholesterolemia, unspecified: Secondary | ICD-10-CM | POA: Diagnosis not present

## 2020-12-18 DIAGNOSIS — E039 Hypothyroidism, unspecified: Secondary | ICD-10-CM | POA: Diagnosis not present

## 2020-12-19 LAB — COMPREHENSIVE METABOLIC PANEL
ALT: 14 IU/L (ref 0–32)
AST: 21 IU/L (ref 0–40)
Albumin/Globulin Ratio: 2.1 (ref 1.2–2.2)
Albumin: 4.5 g/dL (ref 3.8–4.8)
Alkaline Phosphatase: 85 IU/L (ref 44–121)
BUN/Creatinine Ratio: 16 (ref 12–28)
BUN: 11 mg/dL (ref 8–27)
Bilirubin Total: 0.7 mg/dL (ref 0.0–1.2)
CO2: 26 mmol/L (ref 20–29)
Calcium: 9.2 mg/dL (ref 8.7–10.3)
Chloride: 104 mmol/L (ref 96–106)
Creatinine, Ser: 0.69 mg/dL (ref 0.57–1.00)
Globulin, Total: 2.1 g/dL (ref 1.5–4.5)
Glucose: 91 mg/dL (ref 70–99)
Potassium: 3.6 mmol/L (ref 3.5–5.2)
Sodium: 142 mmol/L (ref 134–144)
Total Protein: 6.6 g/dL (ref 6.0–8.5)
eGFR: 94 mL/min/{1.73_m2} (ref >59)

## 2020-12-19 LAB — LIPID PANEL
Chol/HDL Ratio: 2.4 ratio (ref 0.0–4.4)
Cholesterol, Total: 159 mg/dL (ref 100–199)
HDL: 67 mg/dL (ref >39)
LDL Chol Calc (NIH): 69 mg/dL (ref 0–99)
Triglycerides: 131 mg/dL (ref 0–149)
VLDL Cholesterol Cal: 23 mg/dL (ref 5–40)

## 2020-12-19 LAB — TSH+FREE T4
Free T4: 1.23 ng/dL (ref 0.82–1.77)
TSH: 1.79 u[IU]/mL (ref 0.450–4.500)

## 2020-12-26 ENCOUNTER — Ambulatory Visit (INDEPENDENT_AMBULATORY_CARE_PROVIDER_SITE_OTHER): Payer: Medicare Other | Admitting: Obstetrics & Gynecology

## 2020-12-26 ENCOUNTER — Other Ambulatory Visit: Payer: Self-pay

## 2020-12-26 ENCOUNTER — Other Ambulatory Visit (HOSPITAL_COMMUNITY)
Admission: RE | Admit: 2020-12-26 | Discharge: 2020-12-26 | Disposition: A | Discharge status: Home / Self Care | Payer: Medicare Other | Source: Ambulatory Visit | Attending: Obstetrics & Gynecology | Admitting: Obstetrics & Gynecology

## 2020-12-26 ENCOUNTER — Encounter (HOSPITAL_BASED_OUTPATIENT_CLINIC_OR_DEPARTMENT_OTHER): Payer: Self-pay | Admitting: Obstetrics & Gynecology

## 2020-12-26 VITALS — BP 140/64 | HR 65 | Ht 62.0 in | Wt 220.4 lb

## 2020-12-26 DIAGNOSIS — Z87412 Personal history of vulvar dysplasia: Secondary | ICD-10-CM

## 2020-12-26 DIAGNOSIS — Z124 Encounter for screening for malignant neoplasm of cervix: Secondary | ICD-10-CM | POA: Diagnosis not present

## 2020-12-26 DIAGNOSIS — E559 Vitamin D deficiency, unspecified: Secondary | ICD-10-CM

## 2020-12-26 DIAGNOSIS — Z01419 Encounter for gynecological examination (general) (routine) without abnormal findings: Secondary | ICD-10-CM

## 2020-12-26 DIAGNOSIS — Z780 Asymptomatic menopausal state: Secondary | ICD-10-CM

## 2020-12-26 NOTE — Progress Notes (Signed)
70 y.o. G34P3003 Married White or Caucasian female here for breast and pelvic exam.  Denies vaginal bleeding.  H/o right ovarian cyst.  Ultrasound done in 2021.  This was simple and we decided to not repeat imaging unless had new symptoms.  H/o vulvar dysplasia, remote hx.  Denies new vulvar symptoms.  Denies vaginal bleeding.  Had diverticulitis earlier this year.  Has a colonoscopy scheduled for early December.  Has IBS and prescriptions have been very expensive so is still managing this conservatively.      Patient's last menstrual period was 01/28/2007 (approximate).          Sexually active: Yes.    H/O STD:  no  Health Maintenance: PCP:  Dr. Brita Romp.  Last wellness appt was 11/2020.  Did blood work at that appt:  yes Vaccines are up to date:  Tdap needs to be updated Colonoscopy:  12/31/2017 MMG:  12/28/2019 Negative BMD:  10/15/2017, normal.   Last pap smear:  11/01/2018 Negative.   H/o abnormal pap smear:  no   reports that she has never smoked. She has never used smokeless tobacco. She reports current alcohol use of about 2.0 standard drinks per week. She reports that she does not use drugs.  Past Medical History:  Diagnosis Date   Anxiety    Arthritis    lower back   Basal cell carcinoma 11/2011   face, left leg-using chemo cream on temple area   Coronary artery disease 2005   LAD PCI with Cypher drug-eluting stent placement 3.0 x 13 mm with balloon angioplasty of diagonal.  Cardiac catheterization in 2010 showed patent stent with minimal restenosis.   GERD (gastroesophageal reflux disease)    Heart disease    Heart problem    98% blockage   Hypercholesteremia    Hypertension    Hypothyroidism    Kidney stone    Osteoarthritis    PONV (postoperative nausea and vomiting)    after 1 procedure approx 10 yrs ago   Sleep apnea    mild, could not tol cpap   Squamous cell carcinoma of skin    leg    Past Surgical History:  Procedure Laterality Date    ANGIOPLASTY  2005   CAD   98% blockage   CARDIAC CATHETERIZATION  02/2008   CATARACT EXTRACTION Right    CATARACT EXTRACTION W/PHACO Right 08/31/2019   Procedure: CATARACT EXTRACTION PHACO AND INTRAOCULAR LENS PLACEMENT ([place]) RIGHT;  Surgeon: Eulogio Bear, MD;  Location: [place];  Service: Ophthalmology;  Laterality: Right;  4.41 0:30.7   CATARACT EXTRACTION W/PHACO Left 09/26/2019   Procedure: CATARACT EXTRACTION PHACO AND INTRAOCULAR LENS PLACEMENT ([facility]) LEFT 1.60 00:20.3;  Surgeon: Eulogio Bear, MD;  Location: [place];  Service: Ophthalmology;  Laterality: Left;   COLONOSCOPY WITH PROPOFOL N/A 12/29/2014   Procedure: COLONOSCOPY WITH PROPOFOL;  Surgeon: Lucilla Lame, MD;  Location: [place];  Service: Endoscopy;  Laterality: N/A;   COLONOSCOPY WITH PROPOFOL N/A 12/31/2017   Procedure: COLONOSCOPY WITH PROPOFOL;  Surgeon: Lucilla Lame, MD;  Location: [place];  Service: Endoscopy;  Laterality: N/A;  sleep apnea   CORONARY ANGIOPLASTY  2005   LAD   EYE SURGERY Right    HYSTEROSCOPY  11/2001   resection of benign leiomyoma   KNEE ARTHROSCOPY Right 08/16/2014   Procedure: ARTHROSCOPY KNEE, partial lateral menisectomy and chondroplasty;  Surgeon: Dereck Leep, MD;  Location: [facility];  Service: Orthopedics;  Laterality: Right;   POLYPECTOMY  12/29/2014  Procedure: POLYPECTOMY;  Surgeon: Lucilla Lame, MD;  Location: [place];  Service: Endoscopy;;   SKIN CANCER EXCISION Right 02/2018   Right leg - Squamous    THYROID CYST EXCISION Left 10/2002   left lobe thyroid excision   THYROIDECTOMY, PARTIAL Left    TUBAL LIGATION  9678   BTL   UMBILICAL HERNIA REPAIR  1980   vulvar cancer  1993   laser    Current Outpatient Medications  Medication Sig Dispense Refill   aspirin 81 MG tablet Take 81 mg by mouth daily.     augmented betamethasone dipropionate (DIPROLENE-AF) 0.05 % cream Apply 1 application topically as needed. For red  bumps     clopidogrel (PLAVIX) 75 MG tablet Take 1 tablet (75 mg total) by mouth daily with breakfast. 90 tablet 3   COVID-19 mRNA bivalent vaccine, Pfizer, (PFIZER COVID-19 VAC BIVALENT) injection Inject into the muscle. 0.3 mL 0   cyclobenzaprine (FLEXERIL) 5 MG tablet TAKE ONE TABLET BY MOUTH THREE TIMES A DAY AS NEEDED FOR MUSCLE SPASMS 30 tablet 0   diclofenac Sodium (VOLTAREN) 1 % GEL Apply topically as needed.     ezetimibe (ZETIA) 10 MG tablet Take 1 tablet (10 mg total) by mouth daily. 90 tablet 3   influenza vaccine adjuvanted (FLUAD QUADRIVALENT) 0.5 ML injection Inject into the muscle. 0.5 mL 0   levothyroxine (SYNTHROID) 75 MCG tablet TAKE ONE TABLET BY MOUTH DAILY BEFORE BREAKFAST 90 tablet 3   losartan (COZAAR) 100 MG tablet Take 1 tablet (100 mg total) by mouth daily. 90 tablet 3   metoprolol tartrate (LOPRESSOR) 25 MG tablet Take 1 tablet (25 mg total) by mouth 2 (two) times daily. 180 tablet 3   Multiple Vitamins-Minerals (MULTIVITAMIN PO) Take by mouth daily.     rosuvastatin (CRESTOR) 10 MG tablet Take 1 tablet (10 mg total) by mouth daily. 90 tablet 3   traZODone (DESYREL) 50 MG tablet Take 0.5-1 tablets (25-50 mg total) by mouth at bedtime as needed for sleep. 30 tablet 5   Vitamin D, Ergocalciferol, (DRISDOL) 1.25 MG (50000 UNIT) CAPS capsule TAKE ONE CAPSULE BY MOUTH EVERY 14 DAYS 6 capsule 3   lubiprostone (AMITIZA) 24 MCG capsule Take 1 capsule (24 mcg total) by mouth 2 (two) times daily with a meal. (Patient not taking: Reported on 12/26/2020) 60 capsule 2   No current facility-administered medications for this visit.    Family History  Problem Relation Age of Onset   Hypertension Mother    Hypertension Father    Diabetes Father    Heart disease Father    Cervical cancer Sister    Stroke Sister    Colon cancer Neg Hx    Breast cancer Neg Hx    Ovarian cancer Neg Hx     Review of Systems  All other systems reviewed and are negative.  Exam:   BP 140/64 (BP  Location: Left Arm, Patient Position: Sitting, Cuff Size: Large)   Pulse 65   Ht 5\' 2"  (1.575 m) Comment: reported  Wt 220 lb 6.4 oz (100 kg)   LMP 01/28/2007 (Approximate)   BMI 40.31 kg/m   Height: 5\' 2"  (157.5 cm) (reported)  General appearance: alert, cooperative and appears stated age Breasts: normal appearance, no masses or tenderness Abdomen: soft, non-tender; bowel sounds normal; no masses,  no organomegaly Lymph nodes: Cervical, supraclavicular, and axillary nodes normal.  No abnormal inguinal nodes palpated Neurologic: Grossly normal  Pelvic: External genitalia:  no lesions  Urethra:  normal appearing urethra with no masses, tenderness or lesions              Bartholins and Skenes: normal                 Vagina: normal appearing vagina with atrophic changes and no discharge, no lesions              Cervix: no lesions              Pap taken: Yes.   Bimanual Exam:  Uterus:  normal size, contour, position, consistency, mobility, non-tender              Adnexa: normal adnexa and no mass, fullness, tenderness               Rectovaginal: Confirms               Anus:  normal sphincter tone, no lesions  Chaperone, Octaviano Batty, CMA, was present for exam.  Assessment/Plan: 1. Encntr for gyn exam (general) (routine) w/o abn findings - pap smear obtained today - Has MMG scheduled for next week - Colonoscopy scheduled for 12/31/2020 - BMD 2019.   - PCP   2. Cervical cancer screening - Cytology - PAP( [facility]) - PR OBTAINING SCREEN PAP SMEAR  3. Postmenopausal - no HRT  4. History of vulvar dysplasia - normal exam today  5. Vitamin D deficiency - on Vit D, PCP is writing rx now

## 2020-12-27 LAB — CYTOLOGY - PAP: Diagnosis: NEGATIVE

## 2020-12-28 ENCOUNTER — Ambulatory Visit
Admission: RE | Admit: 2020-12-28 | Discharge: 2020-12-28 | Disposition: A | Discharge status: Home / Self Care | Payer: Medicare Other | Source: Ambulatory Visit | Attending: Obstetrics & Gynecology | Admitting: Obstetrics & Gynecology

## 2020-12-28 DIAGNOSIS — Z1231 Encounter for screening mammogram for malignant neoplasm of breast: Secondary | ICD-10-CM | POA: Diagnosis not present

## 2020-12-31 ENCOUNTER — Encounter
Admission: RE | Disposition: A | Discharge status: Home / Self Care | Payer: Self-pay | Source: Home / Self Care | Attending: Gastroenterology

## 2020-12-31 ENCOUNTER — Encounter: Payer: Self-pay | Admitting: Gastroenterology

## 2020-12-31 ENCOUNTER — Other Ambulatory Visit: Payer: Self-pay | Admitting: Obstetrics & Gynecology

## 2020-12-31 ENCOUNTER — Ambulatory Visit: Payer: Medicare Other | Admitting: Anesthesiology

## 2020-12-31 ENCOUNTER — Other Ambulatory Visit: Payer: Self-pay

## 2020-12-31 ENCOUNTER — Ambulatory Visit
Admission: RE | Admit: 2020-12-31 | Discharge: 2020-12-31 | Disposition: A | Discharge status: Home / Self Care | Payer: Medicare Other | Attending: Gastroenterology | Admitting: Gastroenterology

## 2020-12-31 DIAGNOSIS — I10 Essential (primary) hypertension: Secondary | ICD-10-CM | POA: Insufficient documentation

## 2020-12-31 DIAGNOSIS — Z6841 Body Mass Index (BMI) 40.0 and over, adult: Secondary | ICD-10-CM | POA: Insufficient documentation

## 2020-12-31 DIAGNOSIS — K648 Other hemorrhoids: Secondary | ICD-10-CM | POA: Insufficient documentation

## 2020-12-31 DIAGNOSIS — I2510 Atherosclerotic heart disease of native coronary artery without angina pectoris: Secondary | ICD-10-CM | POA: Diagnosis not present

## 2020-12-31 DIAGNOSIS — R928 Other abnormal and inconclusive findings on diagnostic imaging of breast: Secondary | ICD-10-CM

## 2020-12-31 DIAGNOSIS — E039 Hypothyroidism, unspecified: Secondary | ICD-10-CM | POA: Diagnosis not present

## 2020-12-31 DIAGNOSIS — K5792 Diverticulitis of intestine, part unspecified, without perforation or abscess without bleeding: Secondary | ICD-10-CM

## 2020-12-31 DIAGNOSIS — K5732 Diverticulitis of large intestine without perforation or abscess without bleeding: Secondary | ICD-10-CM | POA: Diagnosis not present

## 2020-12-31 DIAGNOSIS — K5900 Constipation, unspecified: Secondary | ICD-10-CM | POA: Diagnosis not present

## 2020-12-31 DIAGNOSIS — K5730 Diverticulosis of large intestine without perforation or abscess without bleeding: Secondary | ICD-10-CM | POA: Diagnosis not present

## 2020-12-31 DIAGNOSIS — G4730 Sleep apnea, unspecified: Secondary | ICD-10-CM | POA: Insufficient documentation

## 2020-12-31 DIAGNOSIS — Z955 Presence of coronary angioplasty implant and graft: Secondary | ICD-10-CM | POA: Insufficient documentation

## 2020-12-31 DIAGNOSIS — Z09 Encounter for follow-up examination after completed treatment for conditions other than malignant neoplasm: Secondary | ICD-10-CM | POA: Diagnosis not present

## 2020-12-31 HISTORY — PX: COLONOSCOPY WITH PROPOFOL: SHX5780

## 2020-12-31 SURGERY — COLONOSCOPY WITH PROPOFOL
Anesthesia: General | Site: Rectum

## 2020-12-31 MED ORDER — ACETAMINOPHEN 325 MG PO TABS: 325.0000 mg | ORAL_TABLET | Freq: Once | ORAL | Status: DC

## 2020-12-31 MED ORDER — STERILE WATER FOR IRRIGATION IR SOLN
Status: DC | PRN
  Administered 2020-12-31: 1

## 2020-12-31 MED ORDER — SODIUM CHLORIDE 0.9 % IV SOLN: INTRAVENOUS | Status: DC

## 2020-12-31 MED ORDER — ACETAMINOPHEN 160 MG/5ML PO SOLN: 325.0000 mg | Freq: Once | ORAL | Status: DC

## 2020-12-31 MED ORDER — LIDOCAINE HCL (CARDIAC) PF 100 MG/5ML IV SOSY
PREFILLED_SYRINGE | INTRAVENOUS | Status: DC | PRN
  Administered 2020-12-31: 40 mg via INTRAVENOUS
  Administered 2020-12-31: 100 mg via INTRAVENOUS
  Administered 2020-12-31 (×2): 40 mg via INTRAVENOUS
  Administered 2020-12-31: 30 mg via INTRAVENOUS
  Administered 2020-12-31: 4 mg via INTRAVENOUS

## 2020-12-31 MED ORDER — LACTATED RINGERS IV SOLN: INTRAVENOUS | Status: DC

## 2020-12-31 SURGICAL SUPPLY — 22 items

## 2020-12-31 NOTE — Anesthesia Preprocedure Evaluation (Addendum)
Anesthesia Evaluation  Patient identified by MRN, date of birth, ID band Patient awake    Reviewed: Allergy & Precautions, H&P , NPO status , Patient's Chart, lab work & pertinent test results  History of Anesthesia Complications (+) PONV and history of anesthetic complications  Airway Mallampati: II  TM Distance: >3 FB Neck ROM: full    Dental no notable dental hx.    Pulmonary sleep apnea ,    Pulmonary exam normal breath sounds clear to auscultation       Cardiovascular hypertension, + CAD and + Cardiac Stents  Normal cardiovascular exam Rhythm:regular Rate:Normal     Neuro/Psych    GI/Hepatic GERD  ,  Endo/Other  Hypothyroidism Morbid obesity  Renal/GU      Musculoskeletal   Abdominal   Peds  Hematology   Anesthesia Other Findings   Reproductive/Obstetrics                             Anesthesia Physical Anesthesia Plan  ASA: 3  Anesthesia Plan: General   Post-op Pain Management:    Induction: Intravenous  PONV Risk Score and Plan: 4 or greater and Treatment may vary due to age or medical condition, Propofol infusion and TIVA  Airway Management Planned: Natural Airway  Additional Equipment:   Intra-op Plan:   Post-operative Plan:   Informed Consent: I have reviewed the patients History and Physical, chart, labs and discussed the procedure including the risks, benefits and alternatives for the proposed anesthesia with the patient or authorized representative who has indicated his/her understanding and acceptance.     Dental Advisory Given  Plan Discussed with: CRNA  Anesthesia Plan Comments:        Anesthesia Quick Evaluation

## 2020-12-31 NOTE — Interval H&P Note (Signed)
Lucilla Lame, MD [address] [place], [place] [address] Phone:[phone] Fax : [phone]  Primary Care Physician:  Virginia Crews, MD Primary Gastroenterologist:  Dr. Allen Norris  Pre-Procedure History & Physical: HPI:  Olivia Dougherty is a 70 y.o. female is here for an colonoscopy.   Past Medical History:  Diagnosis Date   Anxiety    Arthritis    lower back   Basal cell carcinoma 11/2011   face, left leg-using chemo cream on temple area   Coronary artery disease 2005   LAD PCI with Cypher drug-eluting stent placement 3.0 x 13 mm with balloon angioplasty of diagonal.  Cardiac catheterization in 2010 showed patent stent with minimal restenosis.   GERD (gastroesophageal reflux disease)    Heart disease    Heart problem    98% blockage   Hypercholesteremia    Hypertension    Hypothyroidism    Kidney stone    Osteoarthritis    PONV (postoperative nausea and vomiting)    after 1 procedure approx 10 yrs ago   Sleep apnea    mild, could not tol cpap   Squamous cell carcinoma of skin    leg    Past Surgical History:  Procedure Laterality Date   ANGIOPLASTY  2005   CAD   98% blockage   CARDIAC CATHETERIZATION  02/2008   CATARACT EXTRACTION Right    CATARACT EXTRACTION W/PHACO Right 08/31/2019   Procedure: CATARACT EXTRACTION PHACO AND INTRAOCULAR LENS PLACEMENT ([place]) RIGHT;  Surgeon: Eulogio Bear, MD;  Location: [facility];  Service: Ophthalmology;  Laterality: Right;  4.41 0:30.7   CATARACT EXTRACTION W/PHACO Left 09/26/2019   Procedure: CATARACT EXTRACTION PHACO AND INTRAOCULAR LENS PLACEMENT ([facility]) LEFT 1.60 00:20.3;  Surgeon: Eulogio Bear, MD;  Location: [facility];  Service: Ophthalmology;  Laterality: Left;   COLONOSCOPY WITH PROPOFOL N/A 12/29/2014   Procedure: COLONOSCOPY WITH PROPOFOL;  Surgeon: Lucilla Lame, MD;  Location: [facility];  Service: Endoscopy;  Laterality: N/A;   COLONOSCOPY WITH PROPOFOL N/A  12/31/2017   Procedure: COLONOSCOPY WITH PROPOFOL;  Surgeon: Lucilla Lame, MD;  Location: [facility];  Service: Endoscopy;  Laterality: N/A;  sleep apnea   CORONARY ANGIOPLASTY  2005   LAD   EYE SURGERY Right    HYSTEROSCOPY  11/2001   resection of benign leiomyoma   KNEE ARTHROSCOPY Right 08/16/2014   Procedure: ARTHROSCOPY KNEE, partial lateral menisectomy and chondroplasty;  Surgeon: Dereck Leep, MD;  Location: [facility];  Service: Orthopedics;  Laterality: Right;   POLYPECTOMY  12/29/2014   Procedure: POLYPECTOMY;  Surgeon: Lucilla Lame, MD;  Location: [facility];  Service: Endoscopy;;   SKIN CANCER EXCISION Right 02/2018   Right leg - Squamous    THYROID CYST EXCISION Left 10/2002   left lobe thyroid excision   THYROIDECTOMY, PARTIAL Left    TUBAL LIGATION  7322   [place]   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 Authorizing Provider  aspirin 81 MG tablet Take 81 mg by mouth daily.   Yes [provider]  augmented betamethasone dipropionate (DIPROLENE-AF) 0.05 % cream Apply 1 application topically as needed. For red bumps 09/01/19  Yes [provider]  clopidogrel (PLAVIX) 75 MG tablet Take 1 tablet (75 mg total) by mouth daily with breakfast. 10/12/20  Yes Wellington Hampshire, MD  cyclobenzaprine (FLEXERIL)  5 MG tablet TAKE ONE TABLET BY MOUTH THREE TIMES A DAY AS NEEDED FOR MUSCLE SPASMS 12/11/20  Yes Bacigalupo, Dionne Bucy, MD  diclofenac Sodium (VOLTAREN) 1 % GEL Apply topically as needed. 12/17/18  Yes [provider]  ezetimibe (ZETIA) 10 MG tablet Take 1 tablet (10 mg total) by mouth daily. 10/12/20 01/10/21 Yes Wellington Hampshire, MD  levothyroxine (SYNTHROID) 75 MCG tablet TAKE ONE TABLET BY MOUTH DAILY BEFORE BREAKFAST 07/08/20  Yes Bacigalupo, Dionne Bucy, MD  losartan (COZAAR) 100 MG tablet Take 1 tablet (100 mg total) by mouth daily. 10/12/20   Yes Wellington Hampshire, MD  metoprolol tartrate (LOPRESSOR) 25 MG tablet Take 1 tablet (25 mg total) by mouth 2 (two) times daily. 10/12/20  Yes Wellington Hampshire, MD  Multiple Vitamins-Minerals (MULTIVITAMIN PO) Take by mouth daily.   Yes [provider]  rosuvastatin (CRESTOR) 10 MG tablet Take 1 tablet (10 mg total) by mouth daily. 10/12/20  Yes Wellington Hampshire, MD  Vitamin D, Ergocalciferol, (DRISDOL) 1.25 MG (50000 UNIT) CAPS capsule TAKE ONE CAPSULE BY MOUTH EVERY 14 DAYS 11/29/20  Yes Bacigalupo, Dionne Bucy, MD  COVID-19 mRNA bivalent vaccine, Pfizer, (PFIZER COVID-19 VAC BIVALENT) injection Inject into the muscle. 10/30/20   Carlyle Basques, MD  influenza vaccine adjuvanted (FLUAD QUADRIVALENT) 0.5 ML injection Inject into the muscle. 11/20/20   Carlyle Basques, MD  lubiprostone (AMITIZA) 24 MCG capsule Take 1 capsule (24 mcg total) by mouth 2 (two) times daily with a meal. Patient not taking: Reported on 12/26/2020 12/11/20   Lucilla Lame, MD  traZODone (DESYREL) 50 MG tablet Take 0.5-1 tablets (25-50 mg total) by mouth at bedtime as needed for sleep. 12/12/19   Virginia Crews, MD    Allergies as of 12/11/2020 - Review Complete 12/11/2020  Allergen Reaction Noted   Atorvastatin Other (See Comments) 12/04/2014   Codeine Shortness Of Breath 08/10/2014   Ace inhibitors Cough 12/04/2014   Enalapril Cough 12/04/2014   Penicillins Hives 12/14/2012   Sulfa antibiotics Hives and Swelling 12/14/2012   Adhesive [tape] Rash 12/14/2012    Family History  Problem Relation Age of Onset   Hypertension Mother    Hypertension Father    Diabetes Father    Heart disease Father    Cervical cancer Sister    Stroke Sister    Colon cancer Neg Hx    Breast cancer Neg Hx    Ovarian cancer Neg Hx     Social History   Socioeconomic History   Marital status: Married    Spouse name: Ronnie   Number of children: 3   Years of education: 12   Highest education level: High school  graduate  Occupational History   Occupation: retired  Tobacco Use   Smoking status: Never   Smokeless tobacco: Never  Vaping Use   Vaping Use: Never used  Substance and Sexual Activity   Alcohol use: Yes    Alcohol/week: 2.0 standard drinks    Types: 2 Glasses of wine per week    Comment: weekends   Drug use: No   Sexual activity: Yes    Partners: Male    Birth control/protection: Surgical  Other Topics Concern   Not on file  Social History Narrative   2 living children; one son deceased from overdose.   Social Determinants of Health   Financial Resource Strain: Low Risk    Difficulty of Paying Living Expenses: Not hard at all  Food Insecurity: No Food Insecurity   Worried About  Running Out of Food in the Last Year: Never true   Ran Out of Food in the Last Year: Never true  Transportation Needs: No Transportation Needs   Lack of Transportation (Medical): No   Lack of Transportation (Non-Medical): No  Physical Activity: Insufficiently Active   Days of Exercise per Week: 1 day   Minutes of Exercise per Session: 10 min  Stress: No Stress Concern Present   Feeling of Stress : Not at all  Social Connections: Moderately Isolated   Frequency of Communication with Friends and Family: Twice a week   Frequency of Social Gatherings with Friends and Family: Once a week   Attends Religious Services: Never   Marine scientist or Organizations: No   Attends Music therapist: Never   Marital Status: Married  Human resources officer Violence: Not At Risk   Fear of Current or Ex-Partner: No   Emotionally Abused: No   Physically Abused: No   Sexually Abused: No    Review of Systems: See HPI, otherwise negative ROS  Physical Exam: BP 138/66   Pulse 72   Temp (!) 97.5 F (36.4 C) (Temporal)   Resp 18   Ht 5\' 2"  (1.575 m)   Wt 100 kg   LMP 01/28/2007 (Approximate)   SpO2 97%   BMI 40.31 kg/m  General:   Alert,  pleasant and cooperative in NAD Head:   Normocephalic and atraumatic. Neck:  Supple; no masses or thyromegaly. Lungs:  Clear throughout to auscultation.    Heart:  Regular rate and rhythm. Abdomen:  Soft, nontender and nondistended. Normal bowel sounds, without guarding, and without rebound.   Neurologic:  Alert and  oriented x4;  grossly normal neurologically.  Impression/Plan: Alysse Rathe Garms is here for an colonoscopy to be performed for recent divertisulitis  Risks, benefits, limitations, and alternatives regarding  colonoscopy have been reviewed with the patient.  Questions have been answered.  All parties agreeable.   Lucilla Lame, MD  12/31/2020, 10:01 AM

## 2020-12-31 NOTE — Op Note (Signed)
[facility] Patient Name: Olivia Dougherty Procedure Date: 12/31/2020 10:41 AM MRN: [id number] Account #: [id number] Date of Birth: 06-27-1950 Admit Type: Outpatient Age: 70 Room: [facility] OR ROOM 01 Gender: Female Note Status: Finalized Instrument Name: [id number] Procedure:             Colonoscopy Indications:           Follow-up of diverticulitis Providers:             Lucilla Lame MD, MD Referring MD:          Dionne Bucy. Bacigalupo (Referring MD) Medicines:             Propofol per Anesthesia Complications:         No immediate complications. Procedure:             Pre-Anesthesia Assessment:                        - Prior to the procedure, a History and Physical was                         performed, and patient medications and allergies were                         reviewed. The patient's tolerance of previous                         anesthesia was also reviewed. The risks and benefits                         of the procedure and the sedation options and risks                         were discussed with the patient. All questions were                         answered, and informed consent was obtained. Prior                         Anticoagulants: The patient has taken no previous                         anticoagulant or antiplatelet agents. ASA Grade                         Assessment: II - A patient with mild systemic disease.                         After reviewing the risks and benefits, the patient                         was deemed in satisfactory condition to undergo the                         procedure.                        After obtaining informed consent, the colonoscope was  passed under direct vision. Throughout the procedure,                         the patient's blood pressure, pulse, and oxygen                         saturations were monitored continuously. The                         Colonoscope was introduced  through the anus and                         advanced to the the cecum, identified by appendiceal                         orifice and ileocecal valve. The colonoscopy was                         performed without difficulty. The patient tolerated                         the procedure well. The quality of the bowel                         preparation was good. Findings:      The perianal and digital rectal examinations were normal.      Multiple small-mouthed diverticula were found in the sigmoid colon.      Non-bleeding internal hemorrhoids were found during retroflexion. The       hemorrhoids were Grade I (internal hemorrhoids that do not prolapse). Impression:            - Diverticulosis in the sigmoid colon.                        - Non-bleeding internal hemorrhoids.                        - No specimens collected. Recommendation:        - Discharge patient to home.                        - Resume previous diet.                        - Continue present medications.                        - Await pathology results.                        - Repeat colonoscopy in 7 years for surveillance. Procedure Code(s):     --- Professional ---                        [phone], Colonoscopy, flexible; diagnostic, including                         collection of specimen(s) by brushing or washing, when                         performed (separate  procedure) Diagnosis Code(s):     --- Professional ---                        R71.16, Diverticulitis of large intestine without                         perforation or abscess without bleeding CPT copyright 2019 American Medical Association. All rights reserved. The codes documented in this report are preliminary and upon coder review may  be revised to meet current compliance requirements. Lucilla Lame MD, MD 12/31/2020 11:06:59 AM This report has been signed electronically. Number of Addenda: 0 Note Initiated On: 12/31/2020 10:41 AM Scope Withdrawal Time: 0  hours 6 minutes 48 seconds  Total Procedure Duration: 0 hours 12 minutes 29 seconds  Estimated Blood Loss:  Estimated blood loss: none.      [facility]

## 2020-12-31 NOTE — Anesthesia Procedure Notes (Signed)
Date/Time: 12/31/2020 10:48 AM Performed by: Cameron Ali, CRNA Pre-anesthesia Checklist: Patient identified, Emergency Drugs available, Suction available, Timeout performed and Patient being monitored Patient Re-evaluated:Patient Re-evaluated prior to induction Oxygen Delivery Method: Nasal cannula Placement Confirmation: positive ETCO2

## 2020-12-31 NOTE — Anesthesia Postprocedure Evaluation (Signed)
Anesthesia Post Note  Patient: Olivia Dougherty  Procedure(s) Performed: COLONOSCOPY WITH PROPOFOL ([place])     Patient location during evaluation: PACU Anesthesia Type: General Level of consciousness: awake and alert and oriented Pain management: satisfactory to patient Vital Signs Assessment: post-procedure vital signs reviewed and stable Respiratory status: spontaneous breathing, nonlabored ventilation and respiratory function stable Cardiovascular status: blood pressure returned to baseline and stable Postop Assessment: Adequate PO intake and No signs of nausea or vomiting Anesthetic complications: no   No notable events documented.  Raliegh Ip

## 2020-12-31 NOTE — Transfer of Care (Signed)
Immediate Anesthesia Transfer of Care Note  Patient: Olivia Dougherty  Procedure(s) Performed: COLONOSCOPY WITH PROPOFOL (Rectum)  Patient Location: PACU  Anesthesia Type: General  Level of Consciousness: awake, alert  and patient cooperative  Airway and Oxygen Therapy: Patient Spontanous Breathing and Patient connected to supplemental oxygen  Post-op Assessment: Post-op Vital signs reviewed, Patient's Cardiovascular Status Stable, Respiratory Function Stable, Patent Airway and No signs of Nausea or vomiting  Post-op Vital Signs: Reviewed and stable  Complications: No notable events documented.

## 2021-01-01 ENCOUNTER — Encounter: Payer: Self-pay | Admitting: Gastroenterology

## 2021-02-04 ENCOUNTER — Ambulatory Visit
Admission: RE | Admit: 2021-02-04 | Discharge: 2021-02-04 | Disposition: A | Discharge status: Home / Self Care | Payer: Medicare Other | Source: Ambulatory Visit | Attending: Obstetrics & Gynecology | Admitting: Obstetrics & Gynecology

## 2021-02-04 ENCOUNTER — Other Ambulatory Visit: Payer: Self-pay

## 2021-02-04 DIAGNOSIS — R928 Other abnormal and inconclusive findings on diagnostic imaging of breast: Secondary | ICD-10-CM

## 2021-02-04 DIAGNOSIS — R922 Inconclusive mammogram: Secondary | ICD-10-CM | POA: Diagnosis not present

## 2021-04-26 DIAGNOSIS — H04123 Dry eye syndrome of bilateral lacrimal glands: Secondary | ICD-10-CM | POA: Diagnosis not present

## 2021-05-01 ENCOUNTER — Telehealth: Payer: Self-pay | Admitting: *Deleted

## 2021-05-01 NOTE — Telephone Encounter (Signed)
Patient is calling because her toes have healed very well from nails being removed 1 year ago, now would like any recommendations on types of polish to use on them. Please advise. ?

## 2021-05-02 NOTE — Telephone Encounter (Signed)
Any should be fine

## 2021-05-02 NOTE — Telephone Encounter (Signed)
Patient given recommendations. 

## 2021-06-20 ENCOUNTER — Encounter: Payer: Self-pay | Admitting: Family Medicine

## 2021-06-20 ENCOUNTER — Ambulatory Visit (INDEPENDENT_AMBULATORY_CARE_PROVIDER_SITE_OTHER): Payer: Medicare Other | Admitting: Family Medicine

## 2021-06-20 VITALS — BP 118/78 | HR 54 | Temp 98.4°F | Resp 16 | Wt 218.0 lb

## 2021-06-20 DIAGNOSIS — E7800 Pure hypercholesterolemia, unspecified: Secondary | ICD-10-CM | POA: Diagnosis not present

## 2021-06-20 DIAGNOSIS — I700 Atherosclerosis of aorta: Secondary | ICD-10-CM | POA: Diagnosis not present

## 2021-06-20 DIAGNOSIS — E039 Hypothyroidism, unspecified: Secondary | ICD-10-CM | POA: Diagnosis not present

## 2021-06-20 DIAGNOSIS — I10 Essential (primary) hypertension: Secondary | ICD-10-CM

## 2021-06-20 DIAGNOSIS — E559 Vitamin D deficiency, unspecified: Secondary | ICD-10-CM

## 2021-06-20 DIAGNOSIS — Z79899 Other long term (current) drug therapy: Secondary | ICD-10-CM | POA: Diagnosis not present

## 2021-06-20 NOTE — Progress Notes (Signed)
I,Sulibeya S Dimas,acting as a scribe for [name], MD.,have documented all relevant documentation on the behalf of [name], MD,as directed by  [name], MD while in the presence of [name], MD.   Established patient visit   Patient: Olivia Dougherty   DOB: [birth year]   70 y.o. Female  MRN: [id number] Visit Date: 06/20/2021  Today's healthcare provider: [name], MD   Chief Complaint  Patient presents with   Hypertension   Subjective    HPI  Hypertension, follow-up  BP Readings from Last 3 Encounters:  06/20/21 118/78  12/31/20 (!) 120/57  12/26/20 140/64   Wt Readings from Last 3 Encounters:  06/20/21 218 lb (98.9 kg)  12/31/20 220 lb 6.3 oz (100 kg)  12/26/20 220 lb 6.4 oz (100 kg)     She was last seen for hypertension 6 months ago.  BP at that visit was 133/78. Management since that visit includes no changes.  She reports excellent compliance with treatment. She is not having side effects.  She is following a Regular diet. She is exercising. She does not smoke.  Use of agents associated with hypertension: none.   Outside blood pressures are 132/75. Symptoms: No chest pain No chest pressure  No palpitations No syncope  No dyspnea No orthopnea  No paroxysmal nocturnal dyspnea No lower extremity edema   Pertinent labs Lab Results  Component Value Date   CHOL 159 12/18/2020   HDL 67 12/18/2020   LDLCALC 69 12/18/2020   TRIG 131 12/18/2020   CHOLHDL 2.4 12/18/2020   Lab Results  Component Value Date   NA 142 12/18/2020   K 3.6 12/18/2020   CREATININE 0.69 12/18/2020   EGFR 94 12/18/2020   GLUCOSE 91 12/18/2020   TSH 1.790 12/18/2020     The 10-year ASCVD risk score (Arnett DK, et al., 2019) is: 7.6%  --------------------------------------------------------------------------------------------------- Hypothyroid, follow-up  Lab Results  Component Value Date   TSH 1.790 12/18/2020   TSH  3.420 06/12/2020   TSH 0.762 12/12/2019   FREET4 1.23 12/18/2020    Wt Readings from Last 3 Encounters:  06/20/21 218 lb (98.9 kg)  12/31/20 220 lb 6.3 oz (100 kg)  12/26/20 220 lb 6.4 oz (100 kg)    She was last seen for hypothyroid 6 months ago.  Management since that visit includes no changes. She reports excellent compliance with treatment. She is not having side effects.   Symptoms: Yes change in energy level No constipation  No diarrhea No heat / cold intolerance  No nervousness Yes palpitations  No weight changes    -----------------------------------------------------------------------------------------   Medications: Outpatient Medications Prior to Visit  Medication Sig   aspirin 81 MG tablet Take 81 mg by mouth daily.   augmented betamethasone dipropionate (DIPROLENE-AF) 0.05 % cream Apply 1 application topically as needed. For red bumps   clopidogrel (PLAVIX) 75 MG tablet Take 1 tablet (75 mg total) by mouth daily with breakfast.   cyclobenzaprine (FLEXERIL) 5 MG tablet TAKE ONE TABLET BY MOUTH THREE TIMES A DAY AS NEEDED FOR MUSCLE SPASMS   diclofenac Sodium (VOLTAREN) 1 % GEL Apply topically as needed.   levothyroxine (SYNTHROID) 75 MCG tablet TAKE ONE TABLET BY MOUTH DAILY BEFORE BREAKFAST   losartan (COZAAR) 100 MG tablet Take 1 tablet (100 mg total) by mouth daily.   lubiprostone (AMITIZA) 24 MCG capsule Take 1 capsule (24 mcg total) by mouth 2 (two) times daily with a meal.   metoprolol tartrate (LOPRESSOR) 25  MG tablet Take 1 tablet (25 mg total) by mouth 2 (two) times daily.   Multiple Vitamins-Minerals (MULTIVITAMIN PO) Take by mouth daily.   rosuvastatin (CRESTOR) 10 MG tablet Take 1 tablet (10 mg total) by mouth daily.   traZODone (DESYREL) 50 MG tablet Take 0.5-1 tablets (25-50 mg total) by mouth at bedtime as needed for sleep.   Vitamin D, Ergocalciferol, (DRISDOL) 1.25 MG (50000 UNIT) CAPS capsule TAKE ONE CAPSULE BY MOUTH EVERY 14 DAYS   ezetimibe  (ZETIA) 10 MG tablet Take 1 tablet (10 mg total) by mouth daily.   [DISCONTINUED] COVID-19 mRNA bivalent vaccine, Pfizer, (PFIZER COVID-19 VAC BIVALENT) injection Inject into the muscle. (Patient not taking: Reported on 06/20/2021)   [DISCONTINUED] influenza vaccine adjuvanted (FLUAD QUADRIVALENT) 0.5 ML injection Inject into the muscle. (Patient not taking: Reported on 06/20/2021)   No facility-administered medications prior to visit.    Review of Systems  Constitutional:  Positive for fatigue. Negative for appetite change and unexpected weight change.  Respiratory:  Negative for chest tightness and shortness of breath.   Cardiovascular:  Negative for chest pain, palpitations and leg swelling.  Gastrointestinal:  Negative for constipation and diarrhea.  Endocrine: Negative for cold intolerance and heat intolerance.       Objective    BP 118/78 (BP Location: Left Arm, Patient Position: Sitting, Cuff Size: Large)   Pulse (!) 54   Temp 98.4 F (36.9 C) (Oral)   Resp 16   Wt 218 lb (98.9 kg)   LMP 01/28/2007 (Approximate)   BMI 39.87 kg/m  BP Readings from Last 3 Encounters:  06/20/21 118/78  12/31/20 (!) 120/57  12/26/20 140/64   Wt Readings from Last 3 Encounters:  06/20/21 218 lb (98.9 kg)  12/31/20 220 lb 6.3 oz (100 kg)  12/26/20 220 lb 6.4 oz (100 kg)      Physical Exam Vitals reviewed.  Constitutional:      General: She is not in acute distress.    Appearance: Normal appearance. She is well-developed. She is not diaphoretic.  HENT:     Head: Normocephalic and atraumatic.  Eyes:     General: No scleral icterus.    Conjunctiva/sclera: Conjunctivae normal.  Neck:     Thyroid: No thyromegaly.  Cardiovascular:     Rate and Rhythm: Normal rate and regular rhythm.     Pulses: Normal pulses.     Heart sounds: Normal heart sounds. No murmur heard. Pulmonary:     Effort: Pulmonary effort is normal. No respiratory distress.     Breath sounds: Normal breath sounds. No  wheezing, rhonchi or rales.  Musculoskeletal:     Cervical back: Neck supple.     Right lower leg: No edema.     Left lower leg: No edema.  Lymphadenopathy:     Cervical: No cervical adenopathy.  Skin:    General: Skin is warm and dry.  Neurological:     Mental Status: She is alert and oriented to person, place, and time. Mental status is at baseline.  Psychiatric:        Mood and Affect: Mood normal.        Behavior: Behavior normal.      No results found for any visits on 06/20/21.  Assessment & Plan     Problem List Items Addressed This Visit       Cardiovascular and Mediastinum   Essential hypertension - Primary    Well controlled Continue current medications Recheck metabolic panel F/u in 6 months  Relevant Orders   Basic Metabolic Panel (BMET)   Aortic atherosclerosis ([place])    Continue RF management F/b Cardiology         Endocrine   Acquired hypothyroidism    Previously well controlled Continue Synthroid at current dose  Recheck TSH and adjust Synthroid as indicated        Relevant Orders   TSH     Other   Hyperlipidemia    Previously well controlled  Continue statin, zetia Repeat FLP and CMP annually  Goal LDL < 70       Morbid obesity ([place])    BMI 39 and assoc with HTN, HLD Discussed importance of healthy weight management Discussed diet and exercise       Vitamin D deficiency    continue high dose weekly supplement (qow) Recheck level       Relevant Orders   VITAMIN D 25 Hydroxy (Vit-D Deficiency, Fractures)   Other Visit Diagnoses     Long-term use of high-risk medication       Relevant Orders   CBC        Return in about 6 months (around 12/21/2021) for CPE, AWV, as scheduled.      I, [name], MD, have reviewed all documentation for this visit. The documentation on 06/20/21 for the exam, diagnosis, procedures, and orders are all accurate and complete.   Aljean Horiuchi, Dionne Bucy, MD, MPH [facility]

## 2021-06-20 NOTE — Assessment & Plan Note (Signed)
Continue RF management F/b Cardiology

## 2021-06-20 NOTE — Assessment & Plan Note (Signed)
Well controlled Continue current medications Recheck metabolic panel F/u in 6 months  

## 2021-06-20 NOTE — Assessment & Plan Note (Signed)
Previously well controlled Continue Synthroid at current dose  Recheck TSH and adjust Synthroid as indicated   

## 2021-06-20 NOTE — Assessment & Plan Note (Signed)
BMI 39 and assoc with HTN, HLD Discussed importance of healthy weight management Discussed diet and exercise  

## 2021-06-20 NOTE — Assessment & Plan Note (Signed)
continue high dose weekly supplement (qow) Recheck level

## 2021-06-20 NOTE — Assessment & Plan Note (Signed)
Previously well controlled  Continue statin, zetia Repeat FLP and CMP annually  Goal LDL < 70

## 2021-06-21 LAB — CBC
Hematocrit: 40.6 % (ref 34.0–46.6)
Hemoglobin: 14.4 g/dL (ref 11.1–15.9)
MCH: 32.7 pg (ref 26.6–33.0)
MCHC: 35.5 g/dL (ref 31.5–35.7)
MCV: 92 fL (ref 79–97)
Platelets: 235 10*3/uL (ref 150–450)
RBC: 4.4 x10E6/uL (ref 3.77–5.28)
RDW: 11.7 % (ref 11.7–15.4)
WBC: 6 10*3/uL (ref 3.4–10.8)

## 2021-06-21 LAB — BASIC METABOLIC PANEL
BUN/Creatinine Ratio: 17 (ref 12–28)
BUN: 14 mg/dL (ref 8–27)
CO2: 24 mmol/L (ref 20–29)
Calcium: 9.3 mg/dL (ref 8.7–10.3)
Chloride: 101 mmol/L (ref 96–106)
Creatinine, Ser: 0.81 mg/dL (ref 0.57–1.00)
Glucose: 100 mg/dL — ABNORMAL HIGH (ref 70–99)
Potassium: 4 mmol/L (ref 3.5–5.2)
Sodium: 145 mmol/L — ABNORMAL HIGH (ref 134–144)
eGFR: 79 mL/min/{1.73_m2} (ref >59)

## 2021-06-21 LAB — VITAMIN D 25 HYDROXY (VIT D DEFICIENCY, FRACTURES): Vit D, 25-Hydroxy: 47.4 ng/mL (ref 30.0–100.0)

## 2021-06-21 LAB — TSH: TSH: 1.23 u[IU]/mL (ref 0.450–4.500)

## 2021-06-27 DIAGNOSIS — D1801 Hemangioma of skin and subcutaneous tissue: Secondary | ICD-10-CM | POA: Diagnosis not present

## 2021-06-27 DIAGNOSIS — L570 Actinic keratosis: Secondary | ICD-10-CM | POA: Diagnosis not present

## 2021-06-27 DIAGNOSIS — D2261 Melanocytic nevi of right upper limb, including shoulder: Secondary | ICD-10-CM | POA: Diagnosis not present

## 2021-06-27 DIAGNOSIS — D225 Melanocytic nevi of trunk: Secondary | ICD-10-CM | POA: Diagnosis not present

## 2021-06-27 DIAGNOSIS — D2262 Melanocytic nevi of left upper limb, including shoulder: Secondary | ICD-10-CM | POA: Diagnosis not present

## 2021-06-27 DIAGNOSIS — Z85828 Personal history of other malignant neoplasm of skin: Secondary | ICD-10-CM | POA: Diagnosis not present

## 2021-06-27 DIAGNOSIS — D485 Neoplasm of uncertain behavior of skin: Secondary | ICD-10-CM | POA: Diagnosis not present

## 2021-07-10 ENCOUNTER — Other Ambulatory Visit: Payer: Self-pay | Admitting: Family Medicine

## 2021-07-10 NOTE — Telephone Encounter (Signed)
Requested medication (s) are due for refill today: expired medication  Requested medication (s) are on the active medication list: yes   Last refill:  07/08/20 #90 3 refills  Future visit scheduled: no  Notes to clinic:  expired medication. Do you want to renew Rx?     Requested Prescriptions  Pending Prescriptions Disp Refills   levothyroxine (SYNTHROID) 75 MCG tablet [Pharmacy Med Name: LEVOTHYROXINE 75 MCG TABLET] 90 tablet 3    Sig: TAKE ONE TABLET BY MOUTH DAILY BEFORE BREAKFAST     Endocrinology:  Hypothyroid Agents Passed - 07/10/2021  6:22 AM      Passed - TSH in normal range and within 360 days    TSH  Date Value Ref Range Status  06/20/2021 1.230 0.450 - 4.500 uIU/mL Final         Passed - Valid encounter within last 12 months    Recent Outpatient Visits           2 weeks ago Essential hypertension   [facility] [place], Dionne Bucy, MD   1 year ago Essential hypertension   [facility] [place], Dionne Bucy, MD   1 year ago Essential hypertension   [place], Dionne Bucy, MD   1 year ago Encounter for Medicare annual wellness exam   [facility] [place], [place], LPN   1 year ago [place], PA-C       Future Appointments             In 3 months [place] Anon, Mertie Clause, MD [facility] [place]

## 2021-08-28 DIAGNOSIS — H31091 Other chorioretinal scars, right eye: Secondary | ICD-10-CM | POA: Diagnosis not present

## 2021-08-28 DIAGNOSIS — H35372 Puckering of macula, left eye: Secondary | ICD-10-CM | POA: Diagnosis not present

## 2021-08-28 DIAGNOSIS — H43392 Other vitreous opacities, left eye: Secondary | ICD-10-CM | POA: Diagnosis not present

## 2021-08-28 DIAGNOSIS — H43812 Vitreous degeneration, left eye: Secondary | ICD-10-CM | POA: Diagnosis not present

## 2021-10-09 ENCOUNTER — Other Ambulatory Visit: Payer: Self-pay | Admitting: Cardiovascular Disease

## 2021-10-09 ENCOUNTER — Telehealth: Payer: Self-pay | Admitting: Family Medicine

## 2021-10-09 NOTE — Telephone Encounter (Signed)
Pt states she dropped off paperwork showing vaccine update for Tetanus and her MyChart is still showing delinquent.

## 2021-10-09 NOTE — Telephone Encounter (Signed)
Patient advised vaccine record has been updated.

## 2021-10-17 ENCOUNTER — Other Ambulatory Visit: Payer: Self-pay | Admitting: Cardiovascular Disease

## 2021-10-27 ENCOUNTER — Other Ambulatory Visit: Payer: Self-pay | Admitting: Cardiovascular Disease

## 2021-10-31 ENCOUNTER — Ambulatory Visit: Payer: Medicare Other | Attending: Cardiovascular Disease | Admitting: Cardiovascular Disease

## 2021-10-31 ENCOUNTER — Encounter: Payer: Self-pay | Admitting: Cardiovascular Disease

## 2021-10-31 VITALS — BP 120/70 | HR 67 | Ht 62.0 in | Wt 214.2 lb

## 2021-10-31 DIAGNOSIS — E785 Hyperlipidemia, unspecified: Secondary | ICD-10-CM | POA: Diagnosis not present

## 2021-10-31 DIAGNOSIS — I2510 Atherosclerotic heart disease of native coronary artery without angina pectoris: Secondary | ICD-10-CM | POA: Diagnosis not present

## 2021-10-31 DIAGNOSIS — I10 Essential (primary) hypertension: Secondary | ICD-10-CM | POA: Insufficient documentation

## 2021-10-31 NOTE — Patient Instructions (Signed)
Medication Instructions:  Your physician recommends that you continue on your current medications as directed. Please refer to the Current Medication list given to you today.  *If you need a refill on your cardiac medications before your next appointment, please call your pharmacy*   Lab Work: None ordered If you have labs (blood work) drawn today and your tests are completely normal, you will receive your results only by: MyChart Message (if you have MyChart) OR A paper copy in the mail If you have any lab test that is abnormal or we need to change your treatment, we will call you to review the results.   Testing/Procedures: None ordered   Follow-Up: At [facility], you and your health needs are our priority.  As part of our continuing mission to provide you with exceptional heart care, we have created designated Provider Care Teams.  These Care Teams include your primary Cardiologist (physician) and Advanced Practice Providers (APPs -  Physician Assistants and Nurse Practitioners) who all work together to provide you with the care you need, when you need it.  We recommend signing up for the patient portal called "MyChart".  Sign up information is provided on this After Visit Summary.  MyChart is used to connect with patients for Virtual Visits (Telemedicine).  Patients are able to view lab/test results, encounter notes, upcoming appointments, etc.  Non-urgent messages can be sent to your provider as well.   To learn more about what you can do with MyChart, go to [email or web address].    Your next appointment:   Your physician wants you to follow-up in: 1 year You will receive a reminder letter in the mail two months in advance. If you don't receive a letter, please call our office to schedule the follow-up appointment.   The format for your next appointment:   In Person  Provider:   You may see Muhammad Arida, MD or one of the following Advanced Practice Providers on  your designated Care Team:   Christopher Berge, NP Ryan Dunn, PA-C Cadence Furth, PA-C Sheri Hammock, NP    Other Instructions N/A  Important Information About Sugar       

## 2021-10-31 NOTE — Progress Notes (Signed)
Cardiology Office Note   Date:  10/31/2021   ID:  Olivia Dougherty, DOB December 15, 1950, MRN [id number]  PCP:  Virginia Crews, MD  Cardiologist:   Kathlyn Sacramento, MD   Chief Complaint  Patient presents with   Other    12 month f/u no complaints today. Meds reviewed verbally with pt.      History of Present Illness: Olivia Dougherty is a 71 y.o. female who is here today for follow-up visit regarding coronary artery disease.  She underwent LAD PCI and Cypher drug-eluting stent placement with balloon angioplasty of diagonal in 2005 by Dr. Rollene Fare.  No cardiac events since then.  Cardiac catheterization in 2010 showed patent stent with minimal restenosis. She has known history of hyperlipidemia with intolerance to most statins due to myalgia.  She had CTA of the coronary arteries in 2017 which showed minimal plaque with no obstructive disease and patent LAD stent. Echocardiogram in September 2022 showed normal LV systolic function with no significant valvular abnormalities.  She has been doing very well overall with no recent chest pain or worsening dyspnea.  No palpitations or dizziness.  She is overall sedentary and does not exercise on a regular basis.   Past Medical History:  Diagnosis Date   Anxiety    Arthritis    lower back   Basal cell carcinoma 11/2011   face, left leg-using chemo cream on temple area   Coronary artery disease 2005   LAD PCI with Cypher drug-eluting stent placement 3.0 x 13 mm with balloon angioplasty of diagonal.  Cardiac catheterization in 2010 showed patent stent with minimal restenosis.   Diverticulitis 12/11/2020   GERD (gastroesophageal reflux disease)    Heart disease    Heart problem    98% blockage   Hypercholesteremia    Hypertension    Hypothyroidism    Kidney stone    Osteoarthritis    PONV (postoperative nausea and vomiting)    after 1 procedure approx 10 yrs ago   Sleep apnea    mild, could not tol cpap   Squamous  cell carcinoma of skin    leg    Past Surgical History:  Procedure Laterality Date   ANGIOPLASTY  2005   CAD   98% blockage   CARDIAC CATHETERIZATION  02/2008   CATARACT EXTRACTION Right    CATARACT EXTRACTION W/PHACO Right 08/31/2019   Procedure: CATARACT EXTRACTION PHACO AND INTRAOCULAR LENS PLACEMENT ([place]) RIGHT;  Surgeon: Eulogio Bear, MD;  Location: [facility];  Service: Ophthalmology;  Laterality: Right;  4.41 0:30.7   CATARACT EXTRACTION W/PHACO Left 09/26/2019   Procedure: CATARACT EXTRACTION PHACO AND INTRAOCULAR LENS PLACEMENT ([facility]) LEFT 1.60 00:20.3;  Surgeon: Eulogio Bear, MD;  Location: [facility];  Service: Ophthalmology;  Laterality: Left;   COLONOSCOPY WITH PROPOFOL N/A 12/29/2014   Procedure: COLONOSCOPY WITH PROPOFOL;  Surgeon: Lucilla Lame, MD;  Location: [facility];  Service: Endoscopy;  Laterality: N/A;   COLONOSCOPY WITH PROPOFOL N/A 12/31/2017   Procedure: COLONOSCOPY WITH PROPOFOL;  Surgeon: Lucilla Lame, MD;  Location: [place];  Service: Endoscopy;  Laterality: N/A;  sleep apnea   COLONOSCOPY WITH PROPOFOL N/A 12/31/2020   Procedure: COLONOSCOPY WITH PROPOFOL;  Surgeon: Lucilla Lame, MD;  Location: [facility];  Service: Endoscopy;  Laterality: N/A;   CORONARY ANGIOPLASTY  2005   LAD   EYE SURGERY Right    HYSTEROSCOPY  11/2001   resection of benign leiomyoma   KNEE ARTHROSCOPY Right 08/16/2014  Procedure: ARTHROSCOPY KNEE, partial lateral menisectomy and chondroplasty;  Surgeon: Dereck Leep, MD;  Location: [facility];  Service: Orthopedics;  Laterality: Right;   POLYPECTOMY  12/29/2014   Procedure: POLYPECTOMY;  Surgeon: Lucilla Lame, MD;  Location: [facility];  Service: Endoscopy;;   SKIN CANCER EXCISION Right 02/2018   Right leg - Squamous    THYROID CYST EXCISION Left 10/2002   left lobe thyroid excision   THYROIDECTOMY, PARTIAL Left    TUBAL LIGATION  0947   BTL   UMBILICAL HERNIA REPAIR   1980   vulvar cancer  1993   laser     Current Outpatient Medications  Medication Sig Dispense Refill   aspirin 81 MG tablet Take 81 mg by mouth daily.     augmented betamethasone dipropionate (DIPROLENE-AF) 0.05 % cream Apply 1 application topically as needed. For red bumps     clopidogrel (PLAVIX) 75 MG tablet Take 1 tablet (75 mg total) by mouth daily with breakfast. Please keep upcoming appointment for future refills. Thank you. 30 tablet 0   cyclobenzaprine (FLEXERIL) 5 MG tablet TAKE ONE TABLET BY MOUTH THREE TIMES A DAY AS NEEDED FOR MUSCLE SPASMS 30 tablet 0   diclofenac Sodium (VOLTAREN) 1 % GEL Apply topically as needed.     ezetimibe (ZETIA) 10 MG tablet TAKE ONE TABLET BY MOUTH DAILY 90 tablet 0   levothyroxine (SYNTHROID) 75 MCG tablet TAKE ONE TABLET BY MOUTH DAILY BEFORE BREAKFAST 90 tablet 3   losartan (COZAAR) 100 MG tablet TAKE ONE TABLET BY MOUTH DAILY 90 tablet 0   metoprolol tartrate (LOPRESSOR) 25 MG tablet Take 1 tablet (25 mg total) by mouth 2 (two) times daily. 180 tablet 3   Multiple Vitamins-Minerals (MULTIVITAMIN PO) Take by mouth daily.     rosuvastatin (CRESTOR) 10 MG tablet Take 1 tablet (10 mg total) by mouth daily. Please keep upcoming appointment for future refills. Thank you. 30 tablet 0   Vitamin D, Ergocalciferol, (DRISDOL) 1.25 MG (50000 UNIT) CAPS capsule TAKE ONE CAPSULE BY MOUTH EVERY 14 DAYS 6 capsule 3   No current facility-administered medications for this visit.    Allergies:   Atorvastatin, Codeine, Ace inhibitors, Enalapril, Penicillins, Sulfa antibiotics, and Adhesive [tape]    Social History:  The patient  reports that she has never smoked. She has never used smokeless tobacco. She reports current alcohol use of about 2.0 standard drinks of alcohol per week. She reports that she does not use drugs.   Family History:  The patient's family history includes Cervical cancer in her sister; Diabetes in her father; Heart disease in her father;  Hypertension in her father and mother; Stroke in her sister.    ROS:  Please see the history of present illness.   Otherwise, review of systems are positive for none.   All other systems are reviewed and negative.    PHYSICAL EXAM: VS:  BP 120/70 (BP Location: Left Arm, Patient Position: Sitting, Cuff Size: Normal)   Pulse 67   Ht '5\' 2"'$  (1.575 m)   Wt 214 lb 4 oz (97.2 kg)   LMP 01/28/2007 (Approximate)   SpO2 99%   BMI 39.19 kg/m  , BMI Body mass index is 39.19 kg/m. GEN: Well nourished, well developed, in no acute distress  HEENT: normal  Neck: no JVD, carotid bruits, or masses Cardiac: RRR; no  rubs, or gallops,no edema .  1/6 systolic ejection murmur in the aortic area Respiratory:  clear to auscultation bilaterally, normal work of breathing  GI: soft, nontender, nondistended, + BS MS: no deformity or atrophy  Skin: warm and dry, no rash Neuro:  Strength and sensation are intact Psych: euthymic mood, full affect   EKG:  EKG is ordered today. The ekg ordered today demonstrates normal sinus rhythm with sinus arrhythmia.   Recent Labs: 12/18/2020: ALT 14 06/20/2021: BUN 14; Creatinine, Ser 0.81; Hemoglobin 14.4; Platelets 235; Potassium 4.0; Sodium 145; TSH 1.230    Lipid Panel    Component Value Date/Time   CHOL 159 12/18/2020 0857   TRIG 131 12/18/2020 0857   HDL 67 12/18/2020 0857   CHOLHDL 2.4 12/18/2020 0857   CHOLHDL 2.8 05/21/2017 0957   VLDL 22 05/21/2017 0957   LDLCALC 69 12/18/2020 0857      Wt Readings from Last 3 Encounters:  10/31/21 214 lb 4 oz (97.2 kg)  06/20/21 218 lb (98.9 kg)  12/31/20 220 lb 6.3 oz (100 kg)           No data to display            ASSESSMENT AND PLAN:  1.  Coronary artery disease involving native coronary arteries without angina: Given that she had first generation drug-eluting stent to the LAD, I favor continued lifelong dual antiplatelet therapy as tolerated.  She has no chest pain at the present time but she  does report worsening exertional dyspnea.  Thus, I requested an echocardiogram for evaluation.  2.  Hyperlipidemia with intolerance to statins.  She is already small dose rosuvastatin and Zetia.  I reviewed most recent lipid profile done in November which showed an LDL of 69.  3.  Essential hypertension: Blood pressure is well controlled on current medications.   Disposition:   FU with me in 12 months  Signed,  Kathlyn Sacramento, MD  10/31/2021 11:55 AM    [place]

## 2021-11-01 ENCOUNTER — Other Ambulatory Visit: Payer: Self-pay | Admitting: Family Medicine

## 2021-11-01 DIAGNOSIS — E559 Vitamin D deficiency, unspecified: Secondary | ICD-10-CM

## 2021-11-23 ENCOUNTER — Other Ambulatory Visit: Payer: Self-pay | Admitting: Cardiovascular Disease

## 2021-11-25 ENCOUNTER — Other Ambulatory Visit: Payer: Self-pay | Admitting: Cardiovascular Disease

## 2021-12-10 DIAGNOSIS — Z23 Encounter for immunization: Secondary | ICD-10-CM | POA: Diagnosis not present

## 2021-12-18 ENCOUNTER — Ambulatory Visit (INDEPENDENT_AMBULATORY_CARE_PROVIDER_SITE_OTHER): Payer: Medicare Other

## 2021-12-18 VITALS — BP 136/64 | Ht 62.0 in | Wt 219.7 lb

## 2021-12-18 DIAGNOSIS — Z0000 Encounter for general adult medical examination without abnormal findings: Secondary | ICD-10-CM

## 2021-12-18 NOTE — Patient Instructions (Signed)
Ms. Olivia Dougherty , Thank you for taking time to come for your Medicare Wellness Visit. I appreciate your ongoing commitment to your health goals. Please review the following plan we discussed and let me know if I can assist you in the future.   Screening recommendations/referrals: Colonoscopy: 12/31/20 Mammogram: 02/04/21 Bone Density: 10/15/17 Recommended yearly ophthalmology/optometry visit for glaucoma screening and checkup Recommended yearly dental visit for hygiene and checkup  Vaccinations: Influenza vaccine: 12/10/21 Pneumococcal vaccine: 12/10/18 Tdap vaccine: 03/01/21 Shingles vaccine: Shingrix 08/19/17, 12/09/17   Covid-19:03/31/19, 04/21/19, 11/03/19, 10/30/20  Advanced directives: no  Conditions/risks identified: none  Next appointment: Follow up in one year for your annual wellness visit 12/22/22 @ 10:45 in person   Preventive Care 65 Years and Older, Female Preventive care refers to lifestyle choices and visits with your health care provider that can promote health and wellness. What does preventive care include? A yearly physical exam. This is also called an annual well check. Dental exams once or twice a year. Routine eye exams. Ask your health care provider how often you should have your eyes checked. Personal lifestyle choices, including: Daily care of your teeth and gums. Regular physical activity. Eating a healthy diet. Avoiding tobacco and drug use. Limiting alcohol use. Practicing safe sex. Taking low-dose aspirin every day. Taking vitamin and mineral supplements as recommended by your health care provider. What happens during an annual well check? The services and screenings done by your health care provider during your annual well check will depend on your age, overall health, lifestyle risk factors, and family history of disease. Counseling  Your health care provider may ask you questions about your: Alcohol use. Tobacco use. Drug use. Emotional well-being. Home  and relationship well-being. Sexual activity. Eating habits. History of falls. Memory and ability to understand (cognition). Work and work Statistician. Reproductive health. Screening  You may have the following tests or measurements: Height, weight, and BMI. Blood pressure. Lipid and cholesterol levels. These may be checked every 5 years, or more frequently if you are over 32 years old. Skin check. Lung cancer screening. You may have this screening every year starting at age 31 if you have a 30-pack-year history of smoking and currently smoke or have quit within the past 15 years. Fecal occult blood test (FOBT) of the stool. You may have this test every year starting at age 21. Flexible sigmoidoscopy or colonoscopy. You may have a sigmoidoscopy every 5 years or a colonoscopy every 10 years starting at age 60. Hepatitis C blood test. Hepatitis B blood test. Sexually transmitted disease (STD) testing. Diabetes screening. This is done by checking your blood sugar (glucose) after you have not eaten for a while (fasting). You may have this done every 1-3 years. Bone density scan. This is done to screen for osteoporosis. You may have this done starting at age 57. Mammogram. This may be done every 1-2 years. Talk to your health care provider about how often you should have regular mammograms. Talk with your health care provider about your test results, treatment options, and if necessary, the need for more tests. Vaccines  Your health care provider may recommend certain vaccines, such as: Influenza vaccine. This is recommended every year. Tetanus, diphtheria, and acellular pertussis (Tdap, Td) vaccine. You may need a Td booster every 10 years. Zoster vaccine. You may need this after age 15. Pneumococcal 13-valent conjugate (PCV13) vaccine. One dose is recommended after age 47. Pneumococcal polysaccharide (PPSV23) vaccine. One dose is recommended after age 38. Talk to your  health care provider  about which screenings and vaccines you need and how often you need them. This information is not intended to replace advice given to you by your health care provider. Make sure you discuss any questions you have with your health care provider. Document Released: 02/09/2015 Document Revised: 10/03/2015 Document Reviewed: 11/14/2014 Elsevier Interactive Patient Education  2017 [place] Prevention in the Home Falls can cause injuries. They can happen to people of all ages. There are many things you can do to make your home safe and to help prevent falls. What can I do on the outside of my home? Regularly fix the edges of walkways and driveways and fix any cracks. Remove anything that might make you trip as you walk through a door, such as a raised step or threshold. Trim any bushes or trees on the path to your home. Use bright outdoor lighting. Clear any walking paths of anything that might make someone trip, such as rocks or tools. Regularly check to see if handrails are loose or broken. Make sure that both sides of any steps have handrails. Any raised decks and porches should have guardrails on the edges. Have any leaves, snow, or ice cleared regularly. Use sand or salt on walking paths during winter. Clean up any spills in your garage right away. This includes oil or grease spills. What can I do in the bathroom? Use night lights. Install grab bars by the toilet and in the tub and shower. Do not use towel bars as grab bars. Use non-skid mats or decals in the tub or shower. If you need to sit down in the shower, use a plastic, non-slip stool. Keep the floor dry. Clean up any water that spills on the floor as soon as it happens. Remove soap buildup in the tub or shower regularly. Attach bath mats securely with double-sided non-slip rug tape. Do not have throw rugs and other things on the floor that can make you trip. What can I do in the bedroom? Use night lights. Make sure  that you have a light by your bed that is easy to reach. Do not use any sheets or blankets that are too big for your bed. They should not hang down onto the floor. Have a firm chair that has side arms. You can use this for support while you get dressed. Do not have throw rugs and other things on the floor that can make you trip. What can I do in the kitchen? Clean up any spills right away. Avoid walking on wet floors. Keep items that you use a lot in easy-to-reach places. If you need to reach something above you, use a strong step stool that has a grab bar. Keep electrical cords out of the way. Do not use floor polish or wax that makes floors slippery. If you must use wax, use non-skid floor wax. Do not have throw rugs and other things on the floor that can make you trip. What can I do with my stairs? Do not leave any items on the stairs. Make sure that there are handrails on both sides of the stairs and use them. Fix handrails that are broken or loose. Make sure that handrails are as long as the stairways. Check any carpeting to make sure that it is firmly attached to the stairs. Fix any carpet that is loose or worn. Avoid having throw rugs at the top or bottom of the stairs. If you do have throw rugs, attach them  to the floor with carpet tape. Make sure that you have a light switch at the top of the stairs and the bottom of the stairs. If you do not have them, ask someone to add them for you. What else can I do to help prevent falls? Wear shoes that: Do not have high heels. Have rubber bottoms. Are comfortable and fit you well. Are closed at the toe. Do not wear sandals. If you use a stepladder: Make sure that it is fully opened. Do not climb a closed stepladder. Make sure that both sides of the stepladder are locked into place. Ask someone to hold it for you, if possible. Clearly mark and make sure that you can see: Any grab bars or handrails. First and last steps. Where the edge of  each step is. Use tools that help you move around (mobility aids) if they are needed. These include: Canes. Walkers. Scooters. Crutches. Turn on the lights when you go into a dark area. Replace any light bulbs as soon as they burn out. Set up your furniture so you have a clear path. Avoid moving your furniture around. If any of your floors are uneven, fix them. If there are any pets around you, be aware of where they are. Review your medicines with your doctor. Some medicines can make you feel dizzy. This can increase your chance of falling. Ask your doctor what other things that you can do to help prevent falls. This information is not intended to replace advice given to you by your health care provider. Make sure you discuss any questions you have with your health care provider. Document Released: 11/09/2008 Document Revised: 06/21/2015 Document Reviewed: 02/17/2014 Elsevier Interactive Patient Education  2017 [employer].

## 2021-12-18 NOTE — Progress Notes (Signed)
Subjective:   Olivia Dougherty is a 71 y.o. female who presents for Medicare Annual (Subsequent) preventive examination.  Review of Systems     Cardiac Risk Factors include: advanced age (>92mn, >>64women);hypertension     Objective:    Today's Vitals   12/18/21 1032  BP: 136/64  Weight: 219 lb 11.2 oz (99.7 kg)  Height: '5\' 2"'$  (1.575 m)   Body mass index is 40.18 kg/m.     12/18/2021   10:45 AM 12/31/2020    9:29 AM 12/17/2020   10:34 AM 11/03/2020   11:29 AM 06/01/2020    9:50 PM 12/12/2019   10:24 AM 09/26/2019    7:36 AM  Advanced Directives  Does Patient Have a Medical Advance Directive? No No No No No Yes Yes  Type of ATeacher, early years/preLiving will [place]  [place]?      No - copy requested No - copy requested  Would patient like information on creating a medical advance directive? No - Patient declined No - Patient declined No - Patient declined        Current Medications (verified) Outpatient Encounter Medications as of 12/18/2021  Medication Sig   aspirin 81 MG tablet Take 81 mg by mouth daily.   augmented betamethasone dipropionate (DIPROLENE-AF) 0.05 % cream Apply 1 application topically as needed. For red bumps   clopidogrel (PLAVIX) 75 MG tablet TAKE 1 TABLET BY MOUTH DAILY WITH BREAKFAST   cyclobenzaprine (FLEXERIL) 5 MG tablet TAKE ONE TABLET BY MOUTH THREE TIMES A DAY AS NEEDED FOR MUSCLE SPASMS   ezetimibe (ZETIA) 10 MG tablet TAKE ONE TABLET BY MOUTH DAILY   levothyroxine (SYNTHROID) 75 MCG tablet TAKE ONE TABLET BY MOUTH DAILY BEFORE BREAKFAST   losartan (COZAAR) 100 MG tablet TAKE ONE TABLET BY MOUTH DAILY   metoprolol tartrate (LOPRESSOR) 25 MG tablet TAKE ONE TABLET BY MOUTH TWICE A DAY   Multiple Vitamins-Minerals (MULTIVITAMIN PO) Take by mouth daily.   rosuvastatin (CRESTOR) 10 MG tablet TAKE 1 TABLET BY MOUTH DAILY   Vitamin D,  Ergocalciferol, (DRISDOL) 1.25 MG (50000 UNIT) CAPS capsule TAKE 1 CAPSULE BY MOUTH EVERY 14 DAYS   diclofenac Sodium (VOLTAREN) 1 % GEL Apply topically as needed. (Patient not taking: Reported on 12/18/2021)   No facility-administered encounter medications on file as of 12/18/2021.    Allergies (verified) Atorvastatin, Codeine, Ace inhibitors, Enalapril, Penicillins, Sulfa antibiotics, and Adhesive [tape]   History: Past Medical History:  Diagnosis Date   Anxiety    Arthritis    lower back   Basal cell carcinoma 11/2011   face, left leg-using chemo cream on temple area   Coronary artery disease 2005   LAD PCI with Cypher drug-eluting stent placement 3.0 x 13 mm with balloon angioplasty of diagonal.  Cardiac catheterization in 2010 showed patent stent with minimal restenosis.   Diverticulitis 12/11/2020   GERD (gastroesophageal reflux disease)    Heart disease    Heart problem    98% blockage   Hypercholesteremia    Hypertension    Hypothyroidism    Kidney stone    Osteoarthritis    PONV (postoperative nausea and vomiting)    after 1 procedure approx 10 yrs ago   Sleep apnea    mild, could not tol cpap   Squamous cell carcinoma of skin    leg   Past Surgical History:  Procedure Laterality Date  ANGIOPLASTY  2005   CAD   98% blockage   CARDIAC CATHETERIZATION  02/2008   CATARACT EXTRACTION Right    CATARACT EXTRACTION W/PHACO Right 08/31/2019   Procedure: CATARACT EXTRACTION PHACO AND INTRAOCULAR LENS PLACEMENT ([place]) RIGHT;  Surgeon: Eulogio Bear, MD;  Location: [place];  Service: Ophthalmology;  Laterality: Right;  4.41 0:30.7   CATARACT EXTRACTION W/PHACO Left 09/26/2019   Procedure: CATARACT EXTRACTION PHACO AND INTRAOCULAR LENS PLACEMENT ([facility]) LEFT 1.60 00:20.3;  Surgeon: Eulogio Bear, MD;  Location: [place];  Service: Ophthalmology;  Laterality: Left;   COLONOSCOPY WITH PROPOFOL N/A 12/29/2014   Procedure: COLONOSCOPY WITH PROPOFOL;   Surgeon: Lucilla Lame, MD;  Location: [place];  Service: Endoscopy;  Laterality: N/A;   COLONOSCOPY WITH PROPOFOL N/A 12/31/2017   Procedure: COLONOSCOPY WITH PROPOFOL;  Surgeon: Lucilla Lame, MD;  Location: [place];  Service: Endoscopy;  Laterality: N/A;  sleep apnea   COLONOSCOPY WITH PROPOFOL N/A 12/31/2020   Procedure: COLONOSCOPY WITH PROPOFOL;  Surgeon: Lucilla Lame, MD;  Location: [place];  Service: Endoscopy;  Laterality: N/A;   CORONARY ANGIOPLASTY  2005   LAD   EYE SURGERY Right    HYSTEROSCOPY  11/2001   resection of benign leiomyoma   KNEE ARTHROSCOPY Right 08/16/2014   Procedure: ARTHROSCOPY KNEE, partial lateral menisectomy and chondroplasty;  Surgeon: Dereck Leep, MD;  Location: [facility];  Service: Orthopedics;  Laterality: Right;   POLYPECTOMY  12/29/2014   Procedure: POLYPECTOMY;  Surgeon: Lucilla Lame, MD;  Location: [place];  Service: Endoscopy;;   SKIN CANCER EXCISION Right 02/2018   Right leg - Squamous    THYROID CYST EXCISION Left 10/2002   left lobe thyroid excision   THYROIDECTOMY, PARTIAL Left    TUBAL LIGATION  0814   BTL   UMBILICAL HERNIA REPAIR  1980   vulvar cancer  1993   laser   Family History  Problem Relation Age of Onset   Hypertension Mother    Hypertension Father    Diabetes Father    Heart disease Father    Cervical cancer Sister    Stroke Sister    Colon cancer Neg Hx    Breast cancer Neg Hx    Ovarian cancer Neg Hx    Social History   Socioeconomic History   Marital status: Married    Spouse name: Ronnie   Number of children: 3   Years of education: 12   Highest education level: High school graduate  Occupational History   Occupation: retired  Tobacco Use   Smoking status: Never   Smokeless tobacco: Never  Vaping Use   Vaping Use: Never used  Substance and Sexual Activity   Alcohol use: Yes    Alcohol/week: 2.0 standard drinks of alcohol    Types: 2 Glasses of wine per week     Comment: weekends   Drug use: No   Sexual activity: Yes    Partners: Male    Birth control/protection: Surgical  Other Topics Concern   Not on file  Social History Narrative   2 living children; one son deceased from overdose.   Social Determinants of Health   Financial Resource Strain: Low Risk  (12/18/2021)   Overall Financial Resource Strain (CARDIA)    Difficulty of Paying Living Expenses: Not hard at all  Food Insecurity: No Food Insecurity (12/18/2021)   Hunger Vital Sign    Worried About Running Out of Food in the Last Year: Never true  Ran Out of Food in the Last Year: Never true  Transportation Needs: No Transportation Needs (12/18/2021)   PRAPARE - Hydrologist (Medical): No    Lack of Transportation (Non-Medical): No  Physical Activity: Insufficiently Active (12/18/2021)   Exercise Vital Sign    Days of Exercise per Week: 3 days    Minutes of Exercise per Session: 30 min  Stress: No Stress Concern Present (12/18/2021)   [place]    Feeling of Stress : Not at all  Social Connections: Moderately Integrated (12/18/2021)   Social Connection and Isolation Panel [NHANES]    Frequency of Communication with Friends and Family: Three times a week    Frequency of Social Gatherings with Friends and Family: Once a week    Attends Religious Services: More than 4 times per year    Active Member of [employer] or Organizations: No    Attends Music therapist: Never    Marital Status: Married    Tobacco Counseling Counseling given: Not Answered   Clinical Intake:  Pre-visit preparation completed: Yes  Pain : No/denies pain     Nutritional Risks: None Diabetes: No  How often do you need to have someone help you when you read instructions, pamphlets, or other written materials from your doctor or pharmacy?: 1 - Never  Diabetic?no  Interpreter Needed?:  No  Information entered by :: Kirke Shaggy, LPN   Activities of Daily Living    12/18/2021   10:46 AM 12/14/2021    9:11 PM  In your present state of health, do you have any difficulty performing the following activities:  Hearing? 0 0  Vision? 0 0  Difficulty concentrating or making decisions? 0 0  Walking or climbing stairs? 0 0  Dressing or bathing? 0 0  Doing errands, shopping? 0 0  Preparing Food and eating ? N N  Using the Toilet? N N  In the past six months, have you accidently leaked urine? N N  Do you have problems with loss of bowel control? N N  Managing your Medications? N N  Managing your Finances? N N  Housekeeping or managing your Housekeeping? N N    Patient Care Team: Virginia Crews, MD as PCP - General (Family Medicine) Wellington Hampshire, MD as PCP - Cardiology (Cardiology) Lucilla Lame, MD as Consulting Physician (Gastroenterology) Oneta Rack, MD (Dermatology) Eulogio Bear, MD as Consulting Physician (Ophthalmology) Sherlynn Stalls, MD as Consulting Physician (Ophthalmology) Clyde Canterbury, MD as Referring Physician (Otolaryngology)  Indicate any recent Medical Services you may have received from other than [facility] providers in the past year (date may be approximate).     Assessment:   This is a routine wellness examination for Bevan.  Hearing/Vision screen Hearing Screening - Comments:: No aids Vision Screening - Comments:: Wears glasses- [facility]  Dietary issues and exercise activities discussed: Current Exercise Habits: Home exercise routine, Type of exercise: walking, Time (Minutes): 30, Frequency (Times/Week): 3, Weekly Exercise (Minutes/Week): 90, Intensity: Mild   Goals Addressed               This Visit's Progress     Weight (lb) < 200 lb (90.7 kg) (pt-stated)   219 lb 11.2 oz (99.7 kg)     Lose 50 lbs in a year       Depression Screen    12/18/2021   10:43 AM 06/20/2021   10:25 AM 12/26/2020  2:54 PM  12/17/2020   11:18 AM 12/17/2020   10:36 AM 12/17/2020   10:29 AM 12/12/2019   10:21 AM  PHQ 2/9 Scores  PHQ - 2 Score 0 0 0 0 0 0 0  PHQ- 9 Score 0 2  1       Fall Risk    12/18/2021   10:46 AM 12/14/2021    9:11 PM 06/20/2021   10:24 AM 12/17/2020   11:18 AM 12/17/2020   10:35 AM  Fall Risk   Falls in the past year? 0 0 0 0 0  Number falls in past yr: 0 0 0 0 0  Injury with Fall? 0 0 0 0 0  Risk for fall due to : No Fall Risks  No Fall Risks No Fall Risks No Fall Risks  Follow up Falls prevention discussed;Falls evaluation completed  Falls evaluation completed  Falls prevention discussed    FALL RISK PREVENTION PERTAINING TO THE HOME:  Any stairs in or around the home? Yes  If so, are there any without handrails? No  Home free of loose throw rugs in walkways, pet beds, electrical cords, etc? Yes  Adequate lighting in your home to reduce risk of falls? Yes   ASSISTIVE DEVICES UTILIZED TO PREVENT FALLS:  Life alert? No  Use of a cane, walker or w/c? No  Grab bars in the bathroom? Yes  Shower chair or bench in shower? No  Elevated toilet seat or a handicapped toilet? No   TIMED UP AND GO:  Was the test performed? Yes .  Length of time to ambulate 10 feet: 4 sec.   Gait steady and fast without use of assistive device  Cognitive Function:        12/18/2021   10:53 AM  6CIT Screen  What Year? 0 points  What month? 0 points  What time? 0 points  Count back from 20 0 points  Months in reverse 0 points  Repeat phrase 0 points  Total Score 0 points    Immunizations Immunization History  Administered Date(s) Administered   Fluad Quad(high Dose 65+) 10/06/2018, 11/02/2019, 11/21/2020   Influenza, High Dose Seasonal PF 10/21/2017   Influenza-Unspecified 12/27/2014, 10/28/2015, 12/10/2021   PFIZER Comirnaty(Gray Top)Covid-19 Tri-Sucrose Vaccine 05/02/2020   PFIZER(Purple Top)SARS-COV-2 Vaccination 03/31/2019, 04/21/2019, 11/03/2019   Pfizer Covid-19 Vaccine  Bivalent Booster 30yr & up 10/30/2020   Pneumococcal Conjugate-13 08/10/2017   Pneumococcal Polysaccharide-23 12/10/2018   Tdap 10/28/2010, 03/01/2021   Zoster Recombinat (Shingrix) 08/19/2017, 12/09/2017    TDAP status: Up to date  Flu Vaccine status: Up to date  Pneumococcal vaccine status: Up to date  Covid-19 vaccine status: Completed vaccines  Qualifies for Shingles Vaccine? Yes   Zostavax completed No   Shingrix Completed?: Yes  Screening Tests Health Maintenance  Topic Date Due   COVID-19 Vaccine (6 - 2023-24 season) 09/27/2021   MAMMOGRAM  12/28/2021   Medicare Annual Wellness ([place]  12/19/2022   COLONOSCOPY (Pts 45-421yrInsurance coverage will need to be confirmed)  01/01/2028   Pneumonia Vaccine 6572Years old  Completed   INFLUENZA VACCINE  Completed   DEXA SCAN  Completed   Hepatitis C Screening  Completed   Zoster Vaccines- Shingrix  Completed   HPV VACCINES  Aged Out    Health Maintenance  Health Maintenance Due  Topic Date Due   COVID-19 Vaccine (6 - 2023-24 season) 09/27/2021    Colorectal cancer screening: Type of screening: Colonoscopy. Completed 12/31/20. Repeat every 7 years  Mammogram status:  Completed 02/04/21. Repeat every year  Bone Density status: Completed 10/15/17. Results reflect: Bone density results: NORMAL. Repeat every 5 years.  Lung Cancer Screening: (Low Dose CT Chest recommended if Age 75-80 years, 30 pack-year currently smoking OR have quit w/in 15years.) does not qualify.   Additional Screening:  Hepatitis C Screening: does qualify; Completed 01/17/15  Vision Screening: Recommended annual ophthalmology exams for early detection of glaucoma and other disorders of the eye. Is the patient up to date with their annual eye exam?  Yes  Who is the provider or what is the name of the office in which the patient attends annual eye exams? [facility] If pt is not established with a provider, would they like to be referred to a provider  to establish care? No .   Dental Screening: Recommended annual dental exams for proper oral hygiene  Community Resource Referral / Chronic Care Management: CRR required this visit?  No   CCM required this visit?  No      Plan:     I have personally reviewed and noted the following in the patient's chart:   Medical and social history Use of alcohol, tobacco or illicit drugs  Current medications and supplements including opioid prescriptions. Patient is not currently taking opioid prescriptions. Functional ability and status Nutritional status Physical activity Advanced directives List of other physicians Hospitalizations, surgeries, and ER visits in previous 12 months Vitals Screenings to include cognitive, depression, and falls Referrals and appointments  In addition, I have reviewed and discussed with patient certain preventive protocols, quality metrics, and best practice recommendations. A written personalized care plan for preventive services as well as general preventive health recommendations were provided to patient.     Dionisio David, LPN   [id number]/44/[id number]   Nurse Notes: none

## 2021-12-23 ENCOUNTER — Telehealth: Payer: Self-pay

## 2021-12-23 ENCOUNTER — Ambulatory Visit (INDEPENDENT_AMBULATORY_CARE_PROVIDER_SITE_OTHER): Payer: Medicare Other | Admitting: Family Medicine

## 2021-12-23 ENCOUNTER — Encounter: Payer: Self-pay | Admitting: Family Medicine

## 2021-12-23 VITALS — BP 125/57 | HR 52 | Temp 98.2°F | Resp 16 | Ht 62.0 in | Wt 216.3 lb

## 2021-12-23 DIAGNOSIS — I10 Essential (primary) hypertension: Secondary | ICD-10-CM | POA: Diagnosis not present

## 2021-12-23 DIAGNOSIS — E559 Vitamin D deficiency, unspecified: Secondary | ICD-10-CM | POA: Diagnosis not present

## 2021-12-23 DIAGNOSIS — E7800 Pure hypercholesterolemia, unspecified: Secondary | ICD-10-CM | POA: Diagnosis not present

## 2021-12-23 DIAGNOSIS — E039 Hypothyroidism, unspecified: Secondary | ICD-10-CM

## 2021-12-23 DIAGNOSIS — Z2911 Encounter for prophylactic immunotherapy for respiratory syncytial virus (RSV): Secondary | ICD-10-CM

## 2021-12-23 DIAGNOSIS — F411 Generalized anxiety disorder: Secondary | ICD-10-CM

## 2021-12-23 NOTE — Assessment & Plan Note (Signed)
BMI 39 and assoc with HTN and HLD Discussed importance of healthy weight management Discussed diet and exercise

## 2021-12-23 NOTE — Assessment & Plan Note (Signed)
Previously well controlled Continue statin, zetia Repeat FLP and CMP Goal LDL < 70

## 2021-12-23 NOTE — Assessment & Plan Note (Signed)
Chronic and stable Not on any medications Could consider SSRI in the future Avoid chronic benzos

## 2021-12-23 NOTE — Progress Notes (Signed)
I,[name],acting as a scribe for Olivia Paganini, MD.,have documented all relevant documentation on the behalf of Olivia Paganini, MD,as directed by  Olivia Paganini, MD while in the presence of Olivia Paganini, MD.   Established patient visit   Patient: Olivia Dougherty   DOB: November 28, 1950   71 y.o. Female  MRN: [id number] Visit Date: 12/23/2021  Today's healthcare provider: [name], MD   Chief Complaint  Patient presents with   Follow-up   Subjective    HPI Patient had AWV done 12/18/2021. Hypertension, follow-up  BP Readings from Last 3 Encounters:  12/23/21 (!) 125/57  12/18/21 136/64  10/31/21 120/70   Wt Readings from Last 3 Encounters:  12/23/21 216 lb 4.8 oz (98.1 kg)  12/18/21 219 lb 11.2 oz (99.7 kg)  10/31/21 214 lb 4 oz (97.2 kg)     She was last seen for hypertension 6 months ago.  BP at that visit was 118/78. Management since that visit includes Continue current medications .  She reports excellent compliance with treatment. She is not having side effects.  She does not smoke.  Outside blood pressures are 127/68.  Symptoms: No chest pain No chest pressure  No palpitations No syncope  No dyspnea No orthopnea  No paroxysmal nocturnal dyspnea No lower extremity edema   Pertinent labs Lab Results  Component Value Date   CHOL 159 12/18/2020   HDL 67 12/18/2020   LDLCALC 69 12/18/2020   TRIG 131 12/18/2020   CHOLHDL 2.4 12/18/2020   Lab Results  Component Value Date   NA 145 (H) 06/20/2021   K 4.0 06/20/2021   CREATININE 0.81 06/20/2021   EGFR 79 06/20/2021   GLUCOSE 100 (H) 06/20/2021   TSH 1.230 06/20/2021     The 10-year ASCVD risk score (Arnett DK, et al., 2019) is: 9.6%  ---------------------------------------------------------------------------------------------------   Medications: Outpatient Medications Prior to Visit  Medication Sig   aspirin 81 MG tablet Take 81 mg by mouth daily.   augmented  betamethasone dipropionate (DIPROLENE-AF) 0.05 % cream Apply 1 application topically as needed. For red bumps   clopidogrel (PLAVIX) 75 MG tablet TAKE 1 TABLET BY MOUTH DAILY WITH BREAKFAST   cyclobenzaprine (FLEXERIL) 5 MG tablet TAKE ONE TABLET BY MOUTH THREE TIMES A DAY AS NEEDED FOR MUSCLE SPASMS   diclofenac Sodium (VOLTAREN) 1 % GEL Apply topically as needed.   ezetimibe (ZETIA) 10 MG tablet TAKE ONE TABLET BY MOUTH DAILY   levothyroxine (SYNTHROID) 75 MCG tablet TAKE ONE TABLET BY MOUTH DAILY BEFORE BREAKFAST   losartan (COZAAR) 100 MG tablet TAKE ONE TABLET BY MOUTH DAILY   metoprolol tartrate (LOPRESSOR) 25 MG tablet TAKE ONE TABLET BY MOUTH TWICE A DAY   Multiple Vitamins-Minerals (MULTIVITAMIN PO) Take by mouth daily.   rosuvastatin (CRESTOR) 10 MG tablet TAKE 1 TABLET BY MOUTH DAILY   Vitamin D, Ergocalciferol, (DRISDOL) 1.25 MG (50000 UNIT) CAPS capsule TAKE 1 CAPSULE BY MOUTH EVERY 14 DAYS   No facility-administered medications prior to visit.    Review of Systems  Musculoskeletal:  Positive for arthralgias, back pain and myalgias.  All other systems reviewed and are negative.      Objective    BP (!) 125/57 (BP Location: Right Arm, Patient Position: Sitting, Cuff Size: Large)   Pulse (!) 52   Temp 98.2 F (36.8 C) (Oral)   Resp 16   Ht _0  (1.575 m)   Wt 216 lb 4.8 oz (98.1 kg)   LMP 01/28/2007 (Approximate)  BMI 39.56 kg/m    Physical Exam Vitals reviewed.  Constitutional:      General: She is not in acute distress.    Appearance: Normal appearance. She is well-developed. She is not diaphoretic.  HENT:     Head: Normocephalic and atraumatic.     Right Ear: Tympanic membrane, ear canal and external ear normal.     Left Ear: Tympanic membrane, ear canal and external ear normal.     Nose: Nose normal.     Mouth/Throat:     Mouth: Mucous membranes are moist.     Pharynx: Oropharynx is clear. No oropharyngeal exudate.  Eyes:     General: No scleral  icterus.    Conjunctiva/sclera: Conjunctivae normal.     Pupils: Pupils are equal, round, and reactive to light.  Neck:     Thyroid: No thyromegaly.  Cardiovascular:     Rate and Rhythm: Normal rate and regular rhythm.     Pulses: Normal pulses.     Heart sounds: Normal heart sounds. No murmur heard. Pulmonary:     Effort: Pulmonary effort is normal. No respiratory distress.     Breath sounds: Normal breath sounds. No wheezing or rales.  Abdominal:     General: There is no distension.     Palpations: Abdomen is soft.     Tenderness: There is no abdominal tenderness.  Musculoskeletal:        General: No deformity.     Cervical back: Neck supple.     Right lower leg: No edema.     Left lower leg: No edema.  Lymphadenopathy:     Cervical: No cervical adenopathy.  Skin:    General: Skin is warm and dry.     Findings: No rash.  Neurological:     Mental Status: She is alert and oriented to person, place, and time. Mental status is at baseline.     Sensory: No sensory deficit.     Motor: No weakness.     Gait: Gait normal.  Psychiatric:        Mood and Affect: Mood normal.        Behavior: Behavior normal.        Thought Content: Thought content normal.       No results found for any visits on 12/23/21.  Assessment & Plan     Problem List Items Addressed This Visit       Cardiovascular and Mediastinum   Essential hypertension - Primary    Well controlled Continue current medications Recheck metabolic panel F/u in 6 months       Relevant Orders   Comprehensive metabolic panel     Endocrine   Acquired hypothyroidism    Previously well controlled Continue Synthroid at current dose  Recheck TSH and adjust Synthroid as indicated        Relevant Orders   TSH     Other   Hyperlipidemia    Previously well controlled Continue statin, zetia Repeat FLP and CMP Goal LDL < 70       Relevant Orders   Lipid Panel With LDL/HDL Ratio   GAD (generalized anxiety  disorder)    Chronic and stable Not on any medications Could consider SSRI in the future Avoid chronic benzos      Morbid obesity ([place])    BMI 39 and assoc with HTN and HLD Discussed importance of healthy weight management Discussed diet and exercise       Vitamin D deficiency    Continue  supplement Recheck level       Relevant Orders   VITAMIN D 25 Hydroxy (Vit-D Deficiency, Fractures)     Return in about 6 months (around 06/23/2022) for chronic disease f/u.      I, Olivia Paganini, MD, have reviewed all documentation for this visit. The documentation on 12/23/21 for the exam, diagnosis, procedures, and orders are all accurate and complete.   Grisell Bissette, Dionne Bucy, MD, MPH [facility]

## 2021-12-23 NOTE — Telephone Encounter (Signed)
Ok to fax order for COVID vaccine and RSV (Arexvy) vaccine

## 2021-12-23 NOTE — Telephone Encounter (Signed)
Copied from [place]. Topic: General - Other >> Dec 23, 2021  3:07 PM Ludger Nutting wrote: Patient states she called [facility] pharmacy to get scheduled for a covid booster and rsv vaccine. Patient wast told she would need an order from pcp. Please advise.

## 2021-12-23 NOTE — Assessment & Plan Note (Signed)
Continue supplement Recheck level 

## 2021-12-23 NOTE — Assessment & Plan Note (Signed)
Previously well controlled Continue Synthroid at current dose  Recheck TSH and adjust Synthroid as indicated   

## 2021-12-23 NOTE — Assessment & Plan Note (Signed)
Well controlled Continue current medications Recheck metabolic panel F/u in 6 months  

## 2021-12-24 ENCOUNTER — Other Ambulatory Visit: Payer: Self-pay

## 2021-12-24 LAB — COMPREHENSIVE METABOLIC PANEL
ALT: 18 IU/L (ref 0–32)
AST: 21 IU/L (ref 0–40)
Albumin/Globulin Ratio: 1.8 (ref 1.2–2.2)
Albumin: 4.4 g/dL (ref 3.9–4.9)
Alkaline Phosphatase: 84 IU/L (ref 44–121)
BUN/Creatinine Ratio: 16 (ref 12–28)
BUN: 12 mg/dL (ref 8–27)
Bilirubin Total: 0.6 mg/dL (ref 0.0–1.2)
CO2: 24 mmol/L (ref 20–29)
Calcium: 9.7 mg/dL (ref 8.7–10.3)
Chloride: 104 mmol/L (ref 96–106)
Creatinine, Ser: 0.74 mg/dL (ref 0.57–1.00)
Globulin, Total: 2.4 g/dL (ref 1.5–4.5)
Glucose: 90 mg/dL (ref 70–99)
Potassium: 4.6 mmol/L (ref 3.5–5.2)
Sodium: 141 mmol/L (ref 134–144)
Total Protein: 6.8 g/dL (ref 6.0–8.5)
eGFR: 88 mL/min/{1.73_m2} (ref >59)

## 2021-12-24 LAB — LIPID PANEL WITH LDL/HDL RATIO
Cholesterol, Total: 161 mg/dL (ref 100–199)
HDL: 65 mg/dL (ref >39)
LDL Chol Calc (NIH): 75 mg/dL (ref 0–99)
LDL/HDL Ratio: 1.2 ratio (ref 0.0–3.2)
Triglycerides: 119 mg/dL (ref 0–149)
VLDL Cholesterol Cal: 21 mg/dL (ref 5–40)

## 2021-12-24 LAB — TSH: TSH: 0.933 u[IU]/mL (ref 0.450–4.500)

## 2021-12-24 LAB — VITAMIN D 25 HYDROXY (VIT D DEFICIENCY, FRACTURES): Vit D, 25-Hydroxy: 45.5 ng/mL (ref 30.0–100.0)

## 2021-12-24 NOTE — Addendum Note (Signed)
Addended by: Shawna Orleans on: 12/24/2021 01:22 PM   Modules accepted: Orders

## 2022-01-01 ENCOUNTER — Other Ambulatory Visit: Payer: Self-pay

## 2022-01-02 DIAGNOSIS — D485 Neoplasm of uncertain behavior of skin: Secondary | ICD-10-CM | POA: Diagnosis not present

## 2022-01-02 DIAGNOSIS — L820 Inflamed seborrheic keratosis: Secondary | ICD-10-CM | POA: Diagnosis not present

## 2022-01-04 ENCOUNTER — Other Ambulatory Visit: Payer: Self-pay | Admitting: Cardiovascular Disease

## 2022-01-06 ENCOUNTER — Other Ambulatory Visit: Payer: Self-pay | Admitting: Obstetrics & Gynecology

## 2022-01-06 DIAGNOSIS — Z1231 Encounter for screening mammogram for malignant neoplasm of breast: Secondary | ICD-10-CM

## 2022-01-11 ENCOUNTER — Other Ambulatory Visit: Payer: Self-pay | Admitting: Cardiovascular Disease

## 2022-01-14 ENCOUNTER — Ambulatory Visit (INDEPENDENT_AMBULATORY_CARE_PROVIDER_SITE_OTHER): Payer: Medicare Other | Admitting: Physician Assistant

## 2022-01-14 ENCOUNTER — Encounter: Payer: Self-pay | Admitting: Physician Assistant

## 2022-01-14 VITALS — BP 138/55 | HR 74 | Temp 98.4°F | Resp 16 | Wt 220.7 lb

## 2022-01-14 DIAGNOSIS — J329 Chronic sinusitis, unspecified: Secondary | ICD-10-CM

## 2022-01-14 DIAGNOSIS — J301 Allergic rhinitis due to pollen: Secondary | ICD-10-CM

## 2022-01-14 NOTE — Progress Notes (Unsigned)
I,Olivia Dougherty,acting as a Education administrator for [employer], PA-C.,have documented all relevant documentation on the behalf of Olivia Speak, PA-C,as directed by  [employer], PA-C while in the presence of [employer], PA-C.    Established patient visit   Patient: Olivia Dougherty   DOB: 1950/07/12   71 y.o. Female  MRN: [id number] Visit Date: 01/14/2022  Today's healthcare provider: [name], PA-C   No chief complaint on file.  Subjective    URI  This is a new problem. The current episode started in the past 7 days (On Sunday). The problem has been gradually worsening. There has been no fever. Associated symptoms include congestion, coughing, ear pain (off and on), headaches, a plugged ear sensation, rhinorrhea, sinus pain and sneezing. Pertinent negatives include no chest pain, diarrhea, nausea, sore throat, vomiting or wheezing. She has tried acetaminophen and increased fluids for the symptoms.    Took Covid test yesterday It was negative  Medications: Outpatient Medications Prior to Visit  Medication Sig   aspirin 81 MG tablet Take 81 mg by mouth daily.   augmented betamethasone dipropionate (DIPROLENE-AF) 0.05 % cream Apply 1 application topically as needed. For red bumps   clopidogrel (PLAVIX) 75 MG tablet TAKE 1 TABLET BY MOUTH DAILY WITH BREAKFAST   cyclobenzaprine (FLEXERIL) 5 MG tablet TAKE ONE TABLET BY MOUTH THREE TIMES A DAY AS NEEDED FOR MUSCLE SPASMS   diclofenac Sodium (VOLTAREN) 1 % GEL Apply topically as needed.   ezetimibe (ZETIA) 10 MG tablet TAKE 1 TABLET BY MOUTH DAILY   levothyroxine (SYNTHROID) 75 MCG tablet TAKE ONE TABLET BY MOUTH DAILY BEFORE BREAKFAST   losartan (COZAAR) 100 MG tablet TAKE 1 TABLET BY MOUTH DAILY   metoprolol tartrate (LOPRESSOR) 25 MG tablet TAKE ONE TABLET BY MOUTH TWICE A DAY   Multiple Vitamins-Minerals (MULTIVITAMIN PO) Take by mouth daily.   rosuvastatin (CRESTOR) 10 MG tablet TAKE 1 TABLET BY MOUTH DAILY   Vitamin D,  Ergocalciferol, (DRISDOL) 1.25 MG (50000 UNIT) CAPS capsule TAKE 1 CAPSULE BY MOUTH EVERY 14 DAYS   No facility-administered medications prior to visit.    Review of Systems  Constitutional:  Negative for fever.  HENT:  Positive for congestion, ear pain (off and on), postnasal drip, rhinorrhea, sinus pressure, sinus pain and sneezing. Negative for sore throat.   Respiratory:  Positive for cough. Negative for chest tightness, shortness of breath and wheezing.   Cardiovascular:  Negative for chest pain.  Gastrointestinal:  Negative for diarrhea, nausea and vomiting.  Neurological:  Positive for headaches.    {Labs  Heme  Chem  Endocrine  Serology  Results Review (optional):23779}   Objective    LMP 01/28/2007 (Approximate)  {Show previous vital signs (optional):23777}  Physical Exam Vitals reviewed.  Constitutional:      General: She is not in acute distress.    Appearance: Normal appearance. She is well-developed. She is not diaphoretic.  HENT:     Head: Normocephalic and atraumatic.     Right Ear: Ear canal and external ear normal.     Left Ear: Ear canal and external ear normal.     Ears:     Comments: Fluids behind TMs    Nose: Congestion and rhinorrhea present.  Eyes:     General: No scleral icterus.       Right eye: Discharge (watery) present.        Left eye: Discharge (watery) present.    Extraocular Movements: Extraocular movements intact.  Conjunctiva/sclera: Conjunctivae normal.     Pupils: Pupils are equal, round, and reactive to light.  Neck:     Thyroid: No thyromegaly.  Cardiovascular:     Rate and Rhythm: Normal rate and regular rhythm.     Pulses: Normal pulses.     Heart sounds: Normal heart sounds.  Pulmonary:     Effort: Pulmonary effort is normal. No respiratory distress.     Breath sounds: Normal breath sounds. No stridor. No wheezing, rhonchi or rales.  Chest:     Chest wall: No tenderness.  Musculoskeletal:     Cervical back: Neck  supple.     Right lower leg: No edema.     Left lower leg: No edema.  Lymphadenopathy:     Cervical: No cervical adenopathy.  Skin:    General: Skin is warm and dry.     Findings: No rash.  Neurological:     Mental Status: She is alert and oriented to person, place, and time. Mental status is at baseline.  Psychiatric:        Behavior: Behavior normal.        Thought Content: Thought content normal.        Judgment: Judgment normal.     ***  No results found for any visits on 01/14/22.  Assessment & Plan     1. Seasonal allergic rhinitis due to pollen 2. Rhinosinusitis Could be due to allergy, less likely due to viral or bacterial etiology Advised symptomatic treatment The patient was advised to call back or seek an in-person evaluation if the symptoms worsen or if the condition fails to improve as anticipated.  I discussed the assessment and treatment plan with the patient. The patient was provided an opportunity to ask questions and all were answered. The patient agreed with the plan and demonstrated an understanding of the instructions.  The entirety of the information documented in the History of Present Illness, Review of Systems and Physical Exam were personally obtained by me. Portions of this information were initially documented by the CMA and reviewed by me for thoroughness and accuracy.  Olivia Dougherty, [facility], [place] [phone] (phone) [phone] (fax)

## 2022-03-04 ENCOUNTER — Ambulatory Visit
Admission: RE | Admit: 2022-03-04 | Discharge: 2022-03-04 | Disposition: A | Discharge status: Home / Self Care | Payer: Medicare Other | Source: Ambulatory Visit | Attending: Obstetrics & Gynecology | Admitting: Obstetrics & Gynecology

## 2022-03-04 DIAGNOSIS — Z1231 Encounter for screening mammogram for malignant neoplasm of breast: Secondary | ICD-10-CM | POA: Diagnosis not present

## 2022-04-17 ENCOUNTER — Ambulatory Visit (INDEPENDENT_AMBULATORY_CARE_PROVIDER_SITE_OTHER): Payer: Medicare Other | Admitting: Podiatry

## 2022-04-17 ENCOUNTER — Encounter: Payer: Self-pay | Admitting: Podiatry

## 2022-04-17 DIAGNOSIS — L600 Ingrowing nail: Secondary | ICD-10-CM

## 2022-04-17 NOTE — Patient Instructions (Signed)

## 2022-04-17 NOTE — Progress Notes (Signed)
Subjective:   Patient ID: Olivia Dougherty, female   DOB: 72 y.o.   MRN: [id number]   HPI Patient states left big toe and second toe there has been some ingrown spicules that are bothersome like to have those removed other than that everything doing well   ROS      Objective:  Physical Exam  Neurovascular status intact incurvation of the left hallux second nail medial borders with spicule formation irritative upon pressure     Assessment:  Ingrown toenail deformity left hallux second toe spicule formation painful     Plan:  Recommended removal patient wants this done and allowed her to read then signed consent form understanding risk.  Infiltrated 120 mg like Marcaine mixture sterile prep done using sterile instrumentation remove the spicules exposed matrix applied phenol to each toe 3 applications followed by alcohol lavage sterile dressing.  Reappoint to recheck as needed

## 2022-05-09 DIAGNOSIS — H33001 Unspecified retinal detachment with retinal break, right eye: Secondary | ICD-10-CM | POA: Diagnosis not present

## 2022-05-09 DIAGNOSIS — H26493 Other secondary cataract, bilateral: Secondary | ICD-10-CM | POA: Diagnosis not present

## 2022-05-09 DIAGNOSIS — Z961 Presence of intraocular lens: Secondary | ICD-10-CM | POA: Diagnosis not present

## 2022-05-09 DIAGNOSIS — H04123 Dry eye syndrome of bilateral lacrimal glands: Secondary | ICD-10-CM | POA: Diagnosis not present

## 2022-05-13 ENCOUNTER — Ambulatory Visit (INDEPENDENT_AMBULATORY_CARE_PROVIDER_SITE_OTHER): Payer: Medicare Other | Admitting: Family Medicine

## 2022-05-13 VITALS — BP 136/79 | HR 69 | Temp 97.6°F | Resp 12 | Wt 220.0 lb

## 2022-05-13 DIAGNOSIS — M2550 Pain in unspecified joint: Secondary | ICD-10-CM | POA: Diagnosis not present

## 2022-05-13 NOTE — Assessment & Plan Note (Signed)
Longstanding issue that is worsening Was originally in her knees hips and back which seem to work consistent with OA, but now it has progressed to her smaller joints of her hands and elbows No family history of autoimmune disease, but concern for possible rheumatologic condition Will get some rheumatology labs today to start the workup and refer to rheumatology for further evaluation and management Okay to continue Tylenol Okay to use Voltaren gel as needed If there is elevation of her ESR/CRP or other labs, may consider prednisone taper while awaiting rheumatology appointment

## 2022-05-13 NOTE — Progress Notes (Signed)
I,Sulibeya S Dimas,acting as a scribe for Shirlee Latch, MD.,have documented all relevant documentation on the behalf of Shirlee Latch, MD,as directed by  Shirlee Latch, MD while in the presence of Shirlee Latch, MD.    Established patient visit   Patient: Olivia Dougherty   DOB: 11-23-1950   72 y.o. Female  MRN: [id number] Visit Date: 05/13/2022  Today's healthcare provider: Shirlee Latch, MD   No chief complaint on file.  Subjective    HPI  Patient C/O worsening body ache and joint pain x several years. She reports pain is present all the time day and night. She reports hip pain and lower back wakes her up at night. She denies swelling in her joint. She reports some swelling in her knees. Patient reports taking Tylenol extra strength 4 tablets daily. She reports mild pain control.    Used to mostly be knees, hips, and back - now in all joints Doesn't feel like previous statin induced myalgias Has been on combo of zetia and crestor for a few years without recurrence of myalgias  Affecting ADLs - difficult to go grocery shopping or cook  No fam hx of RA, PsA, or lupus or other autoimmune diseases Son is being worked up for [employer] disease currently  Has some good days and some bad  Medications: Outpatient Medications Prior to Visit  Medication Sig   aspirin 81 MG tablet Take 81 mg by mouth daily.   augmented betamethasone dipropionate (DIPROLENE-AF) 0.05 % cream Apply 1 application topically as needed. For red bumps   clopidogrel (PLAVIX) 75 MG tablet TAKE 1 TABLET BY MOUTH DAILY WITH BREAKFAST   cyclobenzaprine (FLEXERIL) 5 MG tablet TAKE ONE TABLET BY MOUTH THREE TIMES A DAY AS NEEDED FOR MUSCLE SPASMS   diclofenac Sodium (VOLTAREN) 1 % GEL Apply topically as needed.   ezetimibe (ZETIA) 10 MG tablet TAKE 1 TABLET BY MOUTH DAILY   levothyroxine (SYNTHROID) 75 MCG tablet TAKE ONE TABLET BY MOUTH DAILY BEFORE BREAKFAST   losartan (COZAAR) 100 MG  tablet TAKE 1 TABLET BY MOUTH DAILY   metoprolol tartrate (LOPRESSOR) 25 MG tablet TAKE ONE TABLET BY MOUTH TWICE A DAY   Multiple Vitamins-Minerals (MULTIVITAMIN PO) Take by mouth daily.   rosuvastatin (CRESTOR) 10 MG tablet TAKE 1 TABLET BY MOUTH DAILY   Vitamin D, Ergocalciferol, (DRISDOL) 1.25 MG (50000 UNIT) CAPS capsule TAKE 1 CAPSULE BY MOUTH EVERY 14 DAYS   No facility-administered medications prior to visit.    Review of Systems  Constitutional:  Positive for fatigue.  Respiratory:  Negative for cough, chest tightness and shortness of breath.   Cardiovascular:  Negative for chest pain, palpitations and leg swelling.  Gastrointestinal:  Negative for abdominal pain, nausea and vomiting.  Musculoskeletal:  Positive for arthralgias, back pain, myalgias, neck pain and neck stiffness. Negative for joint swelling.       Objective    BP 136/79 (BP Location: Left Arm, Patient Position: Sitting, Cuff Size: Large)   Pulse 69   Temp 97.6 F (36.4 C) (Temporal)   Resp 12   Wt 220 lb (99.8 kg)   LMP 01/28/2007 (Approximate)   BMI 40.24 kg/m    Physical Exam Vitals reviewed.  Constitutional:      General: She is not in acute distress.    Appearance: Normal appearance. She is well-developed. She is not diaphoretic.  HENT:     Head: Normocephalic and atraumatic.  Eyes:     General: No scleral icterus.    Conjunctiva/sclera: Conjunctivae  normal.  Neck:     Thyroid: No thyromegaly.  Cardiovascular:     Rate and Rhythm: Normal rate and regular rhythm.     Pulses: Normal pulses.     Heart sounds: Normal heart sounds. No murmur heard. Pulmonary:     Effort: Pulmonary effort is normal. No respiratory distress.     Breath sounds: Normal breath sounds. No wheezing, rhonchi or rales.  Musculoskeletal:     Cervical back: Neck supple.     Right lower leg: No edema.     Left lower leg: No edema.     Comments: TTP around hips, low back, shoulders, elbows, wrists, knees, and hands.  Hand/finger ROM intact, enlargement at DIP and PIP joints b/l No effusions, erythema  Lymphadenopathy:     Cervical: No cervical adenopathy.  Skin:    General: Skin is warm and dry.     Findings: No rash.  Neurological:     Mental Status: She is alert and oriented to person, place, and time. Mental status is at baseline.  Psychiatric:        Mood and Affect: Mood normal.        Behavior: Behavior normal.       No results found for any visits on 05/13/22.  Assessment & Plan     Problem List Items Addressed This Visit       Other   Polyarthralgia - Primary    Longstanding issue that is worsening Was originally in her knees hips and back which seem to work consistent with OA, but now it has progressed to her smaller joints of her hands and elbows No family history of autoimmune disease, but concern for possible rheumatologic condition Will get some rheumatology labs today to start the workup and refer to rheumatology for further evaluation and management Okay to continue Tylenol Okay to use Voltaren gel as needed If there is elevation of her ESR/CRP or other labs, may consider prednisone taper while awaiting rheumatology appointment      Relevant Orders   Rheumatoid factor   CYCLIC CITRUL PEPTIDE ANTIBODY, IGG/IGA   Sedimentation rate   C-reactive protein   ANA w/Reflex if Positive   Ambulatory referral to Rheumatology     Return if symptoms worsen or fail to improve.      I, Shirlee Latch, MD, have reviewed all documentation for this visit. The documentation on 05/13/22 for the exam, diagnosis, procedures, and orders are all accurate and complete.   Corie Allis, Marzella Schlein, MD, MPH [facility]

## 2022-05-15 ENCOUNTER — Telehealth: Payer: Self-pay | Admitting: Family Medicine

## 2022-05-15 LAB — ANA W/REFLEX IF POSITIVE: Anti Nuclear Antibody (ANA): NEGATIVE

## 2022-05-15 LAB — SEDIMENTATION RATE: Sed Rate: 2 mm/hr (ref 0–40)

## 2022-05-15 LAB — RHEUMATOID FACTOR: Rheumatoid fact SerPl-aCnc: <10 IU/mL (ref <14.0)

## 2022-05-15 LAB — C-REACTIVE PROTEIN: CRP: 1 mg/L (ref 0–10)

## 2022-05-15 LAB — CYCLIC CITRUL PEPTIDE ANTIBODY, IGG/IGA: Cyclic Citrullin Peptide Ab: 4 units (ref 0–19)

## 2022-05-15 NOTE — Telephone Encounter (Signed)
Pt. Given lab results, verbalizes understanding. °

## 2022-06-12 DIAGNOSIS — M47816 Spondylosis without myelopathy or radiculopathy, lumbar region: Secondary | ICD-10-CM | POA: Diagnosis not present

## 2022-06-12 DIAGNOSIS — M5136 Other intervertebral disc degeneration, lumbar region: Secondary | ICD-10-CM | POA: Diagnosis not present

## 2022-06-12 DIAGNOSIS — M4802 Spinal stenosis, cervical region: Secondary | ICD-10-CM | POA: Diagnosis not present

## 2022-06-12 DIAGNOSIS — M6389 Disorders of muscle in diseases classified elsewhere, multiple sites: Secondary | ICD-10-CM | POA: Diagnosis not present

## 2022-06-12 DIAGNOSIS — M159 Polyosteoarthritis, unspecified: Secondary | ICD-10-CM | POA: Diagnosis not present

## 2022-06-24 ENCOUNTER — Encounter: Payer: Self-pay | Admitting: Family Medicine

## 2022-06-24 ENCOUNTER — Ambulatory Visit (INDEPENDENT_AMBULATORY_CARE_PROVIDER_SITE_OTHER): Payer: Medicare Other | Admitting: Family Medicine

## 2022-06-24 VITALS — BP 110/57 | HR 58 | Temp 98.0°F | Resp 16 | Ht 62.0 in | Wt 214.6 lb

## 2022-06-24 DIAGNOSIS — E039 Hypothyroidism, unspecified: Secondary | ICD-10-CM | POA: Diagnosis not present

## 2022-06-24 DIAGNOSIS — E7800 Pure hypercholesterolemia, unspecified: Secondary | ICD-10-CM | POA: Diagnosis not present

## 2022-06-24 DIAGNOSIS — I10 Essential (primary) hypertension: Secondary | ICD-10-CM | POA: Diagnosis not present

## 2022-06-24 DIAGNOSIS — I700 Atherosclerosis of aorta: Secondary | ICD-10-CM

## 2022-06-24 NOTE — Progress Notes (Signed)
I,[name],acting as a scribe for Shirlee Latch, MD.,have documented all relevant documentation on the behalf of Shirlee Latch, MD,as directed by  Shirlee Latch, MD while in the presence of Shirlee Latch, MD.   Established patient visit   Patient: Olivia Dougherty   DOB: [birth year]   71 y.o. Female  MRN: [id number] Visit Date: 06/24/2022  Today's healthcare provider: Shirlee Latch, MD   Chief Complaint  Patient presents with   Follow-up   Subjective    HPI  Hypertension, follow-up  BP Readings from Last 3 Encounters:  06/24/22 (!) 110/57  05/13/22 136/79  01/14/22 (!) 138/55   Wt Readings from Last 3 Encounters:  06/24/22 214 lb 9.6 oz (97.3 kg)  05/13/22 220 lb (99.8 kg)  01/14/22 220 lb 11.2 oz (100.1 kg)     She was last seen for hypertension 6 months ago.  BP at that visit was 125/57. Management since that visit includes continue current medications. She reports excellent compliance with treatment.  Outside blood pressures are checked twice a month.Usually 127/78  She does not smoke. --------------------------------------------------------------------------------------------------- Lipid/Cholesterol, follow-up  Last Lipid Panel: Lab Results  Component Value Date   CHOL 161 12/23/2021   LDLCALC 75 12/23/2021   HDL 65 12/23/2021   TRIG 119 12/23/2021    She was last seen for this 6 months ago.  Management since that visit includes Continue statin, zetia. Patient reports that she stopped Zetia for a month. Reports that Rheumatology prescribed cymbalta 30 mg for 45 days one a day and after the 45 days she is going to take it BID. Report this has helped her joint pain. She is planning on going back on Zetia.  She reports excellent compliance with treatment.  Symptoms: No appetite changes No foot ulcerations  No chest pain No chest pressure/discomfort  No dyspnea No orthopnea  No fatigue No lower extremity edema  No  palpitations No paroxysmal nocturnal dyspnea  No nausea No numbness or tingling of extremity  No polydipsia No polyuria  No speech difficulty No syncope   Last metabolic panel Lab Results  Component Value Date   GLUCOSE 90 12/23/2021   NA 141 12/23/2021   K 4.6 12/23/2021   BUN 12 12/23/2021   CREATININE 0.74 12/23/2021   EGFR 88 12/23/2021   GFRNONAA >60 06/01/2020   CALCIUM 9.7 12/23/2021   AST 21 12/23/2021   ALT 18 12/23/2021   The 10-year ASCVD risk score (Arnett DK, et al., 2019) is: 7.6%  ---------------------------------------------------------------------------------------------------  Hypothyroid, follow-up  Lab Results  Component Value Date   TSH 0.933 12/23/2021   TSH 1.230 06/20/2021   TSH 1.790 12/18/2020   FREET4 1.23 12/18/2020    Wt Readings from Last 3 Encounters:  06/24/22 214 lb 9.6 oz (97.3 kg)  05/13/22 220 lb (99.8 kg)  01/14/22 220 lb 11.2 oz (100.1 kg)    She was last seen for hypothyroid 6 months ago.  Management since that visit includes continue current medications.  Symptoms: Yes change in energy level Yes constipation-sometimes  No diarrhea No heat / cold intolerance  No nervousness No palpitations  No weight changes    -----------------------------------------------------------------------------------------   Medications: Outpatient Medications Prior to Visit  Medication Sig   aspirin 81 MG tablet Take 81 mg by mouth daily.   augmented betamethasone dipropionate (DIPROLENE-AF) 0.05 % cream Apply 1 application topically as needed. For red bumps   clopidogrel (PLAVIX) 75 MG tablet TAKE 1 TABLET BY MOUTH DAILY WITH BREAKFAST  cyclobenzaprine (FLEXERIL) 5 MG tablet TAKE ONE TABLET BY MOUTH THREE TIMES A DAY AS NEEDED FOR MUSCLE SPASMS   diclofenac Sodium (VOLTAREN) 1 % GEL Apply topically as needed.   DULoxetine (CYMBALTA) 30 MG capsule Take 30 mg by mouth daily.   ezetimibe (ZETIA) 10 MG tablet TAKE 1 TABLET BY MOUTH DAILY    levothyroxine (SYNTHROID) 75 MCG tablet TAKE ONE TABLET BY MOUTH DAILY BEFORE BREAKFAST   losartan (COZAAR) 100 MG tablet TAKE 1 TABLET BY MOUTH DAILY   metoprolol tartrate (LOPRESSOR) 25 MG tablet TAKE ONE TABLET BY MOUTH TWICE A DAY   Multiple Vitamins-Minerals (MULTIVITAMIN PO) Take by mouth daily.   rosuvastatin (CRESTOR) 10 MG tablet TAKE 1 TABLET BY MOUTH DAILY   Vitamin D, Ergocalciferol, (DRISDOL) 1.25 MG (50000 UNIT) CAPS capsule TAKE 1 CAPSULE BY MOUTH EVERY 14 DAYS   No facility-administered medications prior to visit.    Review of Systems per HPI     Objective    BP (!) 110/57 (BP Location: Left Arm, Patient Position: Sitting, Cuff Size: Normal)   Pulse (!) 58   Temp 98 F (36.7 C) (Oral)   Resp 16   Ht 5\' 2"  (1.575 m)   Wt 214 lb 9.6 oz (97.3 kg)   LMP 01/28/2007 (Approximate)   BMI 39.25 kg/m    Physical Exam Vitals reviewed.  Constitutional:      General: She is not in acute distress.    Appearance: Normal appearance. She is well-developed. She is not diaphoretic.  HENT:     Head: Normocephalic and atraumatic.  Eyes:     General: No scleral icterus.    Conjunctiva/sclera: Conjunctivae normal.  Neck:     Thyroid: No thyromegaly.  Cardiovascular:     Rate and Rhythm: Normal rate and regular rhythm.     Heart sounds: Normal heart sounds. No murmur heard. Pulmonary:     Effort: Pulmonary effort is normal. No respiratory distress.     Breath sounds: Normal breath sounds. No wheezing, rhonchi or rales.  Musculoskeletal:     Cervical back: Neck supple.     Right lower leg: No edema.     Left lower leg: No edema.  Lymphadenopathy:     Cervical: No cervical adenopathy.  Skin:    General: Skin is warm and dry.     Findings: No rash.  Neurological:     Mental Status: She is alert and oriented to person, place, and time. Mental status is at baseline.  Psychiatric:        Mood and Affect: Mood normal.        Behavior: Behavior normal.       No results  found for any visits on 06/24/22.  Assessment & Plan     Problem List Items Addressed This Visit       Cardiovascular and Mediastinum   Essential hypertension - Primary    Well controlled Continue current medications Recheck metabolic panel F/u in 6 months       Relevant Orders   Basic Metabolic Panel (BMET)   Aortic atherosclerosis (HCC)    Continue RF management F/b Cardiology        Endocrine   Acquired hypothyroidism    Previously well controlled Continue Synthroid at current dose  Recheck TSH and adjust Synthroid as indicated        Relevant Orders   TSH     Other   Hyperlipidemia    Previously well controlled Continue statin, zetia (resuming next month) Repeat  FLP and CMP at next visit Goal LDL < 70       Morbid obesity (HCC)    BMI 39 and assoc with HTN and HLD Discussed importance of healthy weight management Discussed diet and exercise         Return in about 6 months (around 12/25/2022) for CPE.      I, Shirlee Latch, MD, have reviewed all documentation for this visit. The documentation on 06/24/22 for the exam, diagnosis, procedures, and orders are all accurate and complete.   Lynore Coscia, Marzella Schlein, MD, MPH [facility]

## 2022-06-24 NOTE — Assessment & Plan Note (Signed)
Previously well controlled Continue statin, zetia (resuming next month) Repeat FLP and CMP at next visit Goal LDL < 70

## 2022-06-24 NOTE — Assessment & Plan Note (Signed)
BMI 39 and assoc with HTN and HLD Discussed importance of healthy weight management Discussed diet and exercise  

## 2022-06-24 NOTE — Assessment & Plan Note (Signed)
Well controlled Continue current medications Recheck metabolic panel F/u in 6 months  

## 2022-06-24 NOTE — Assessment & Plan Note (Signed)
Continue RF management F/b Cardiology 

## 2022-06-24 NOTE — Assessment & Plan Note (Signed)
Previously well controlled Continue Synthroid at current dose  Recheck TSH and adjust Synthroid as indicated   

## 2022-06-25 LAB — TSH: TSH: 1.13 u[IU]/mL (ref 0.450–4.500)

## 2022-06-25 LAB — BASIC METABOLIC PANEL
BUN/Creatinine Ratio: 21 (ref 12–28)
BUN: 16 mg/dL (ref 8–27)
CO2: 23 mmol/L (ref 20–29)
Calcium: 9.6 mg/dL (ref 8.7–10.3)
Chloride: 104 mmol/L (ref 96–106)
Creatinine, Ser: 0.78 mg/dL (ref 0.57–1.00)
Glucose: 92 mg/dL (ref 70–99)
Potassium: 4.4 mmol/L (ref 3.5–5.2)
Sodium: 142 mmol/L (ref 134–144)
eGFR: 82 mL/min/{1.73_m2} (ref >59)

## 2022-07-02 DIAGNOSIS — L821 Other seborrheic keratosis: Secondary | ICD-10-CM | POA: Diagnosis not present

## 2022-07-02 DIAGNOSIS — D2262 Melanocytic nevi of left upper limb, including shoulder: Secondary | ICD-10-CM | POA: Diagnosis not present

## 2022-07-02 DIAGNOSIS — D2261 Melanocytic nevi of right upper limb, including shoulder: Secondary | ICD-10-CM | POA: Diagnosis not present

## 2022-07-02 DIAGNOSIS — D225 Melanocytic nevi of trunk: Secondary | ICD-10-CM | POA: Diagnosis not present

## 2022-07-02 DIAGNOSIS — Z85828 Personal history of other malignant neoplasm of skin: Secondary | ICD-10-CM | POA: Diagnosis not present

## 2022-07-02 DIAGNOSIS — Z08 Encounter for follow-up examination after completed treatment for malignant neoplasm: Secondary | ICD-10-CM | POA: Diagnosis not present

## 2022-07-02 DIAGNOSIS — L570 Actinic keratosis: Secondary | ICD-10-CM | POA: Diagnosis not present

## 2022-07-05 ENCOUNTER — Other Ambulatory Visit: Payer: Self-pay | Admitting: Cardiovascular Disease

## 2022-07-06 ENCOUNTER — Other Ambulatory Visit: Payer: Self-pay | Admitting: Family Medicine

## 2022-07-17 DIAGNOSIS — M47816 Spondylosis without myelopathy or radiculopathy, lumbar region: Secondary | ICD-10-CM | POA: Diagnosis not present

## 2022-07-24 DIAGNOSIS — M47816 Spondylosis without myelopathy or radiculopathy, lumbar region: Secondary | ICD-10-CM | POA: Diagnosis not present

## 2022-07-30 DIAGNOSIS — M47816 Spondylosis without myelopathy or radiculopathy, lumbar region: Secondary | ICD-10-CM | POA: Diagnosis not present

## 2022-08-07 ENCOUNTER — Telehealth: Payer: Self-pay | Admitting: Cardiovascular Disease

## 2022-08-07 DIAGNOSIS — M47816 Spondylosis without myelopathy or radiculopathy, lumbar region: Secondary | ICD-10-CM | POA: Diagnosis not present

## 2022-08-07 NOTE — Telephone Encounter (Signed)
Left message on patient's voicemail informing her that "No problems with the methocarbamol"

## 2022-08-07 NOTE — Telephone Encounter (Signed)
No problems with the methocarbamol

## 2022-08-07 NOTE — Telephone Encounter (Signed)
Pt c/o medication issue:  1. Name of Medication: Redoxon, 500 mg  2. How are you currently taking this medication (dosage and times per day)?   Only to take 1 tablet at bedtime  3. Are you having a reaction (difficulty breathing--STAT)?   4. What is your medication issue?   Patient stated she was prescribed this medication for back pain and wants a call back to discuss if she can take this medication.  Patient stated she has not started this medication as yet.

## 2022-08-20 ENCOUNTER — Other Ambulatory Visit: Payer: Self-pay | Admitting: Cardiovascular Disease

## 2022-08-21 DIAGNOSIS — M47816 Spondylosis without myelopathy or radiculopathy, lumbar region: Secondary | ICD-10-CM | POA: Diagnosis not present

## 2022-09-01 ENCOUNTER — Encounter: Payer: Self-pay | Admitting: Family Medicine

## 2022-09-01 DIAGNOSIS — M159 Polyosteoarthritis, unspecified: Secondary | ICD-10-CM

## 2022-09-01 NOTE — Telephone Encounter (Signed)
  Ok to send referral.  Use OA diagnosis from problem list.  He is at [facility].

## 2022-09-16 ENCOUNTER — Other Ambulatory Visit: Payer: Self-pay | Admitting: Anesthesiology

## 2022-09-16 ENCOUNTER — Ambulatory Visit
Admission: RE | Admit: 2022-09-16 | Discharge: 2022-09-16 | Disposition: A | Discharge status: Home / Self Care | Payer: Medicare Other | Source: Ambulatory Visit | Attending: Anesthesiology | Admitting: Anesthesiology

## 2022-09-16 DIAGNOSIS — M79641 Pain in right hand: Secondary | ICD-10-CM

## 2022-09-16 DIAGNOSIS — G894 Chronic pain syndrome: Secondary | ICD-10-CM | POA: Diagnosis not present

## 2022-09-16 DIAGNOSIS — M7551 Bursitis of right shoulder: Secondary | ICD-10-CM | POA: Diagnosis not present

## 2022-09-16 DIAGNOSIS — M79642 Pain in left hand: Secondary | ICD-10-CM

## 2022-09-16 DIAGNOSIS — M19041 Primary osteoarthritis, right hand: Secondary | ICD-10-CM | POA: Diagnosis not present

## 2022-09-16 DIAGNOSIS — M19042 Primary osteoarthritis, left hand: Secondary | ICD-10-CM | POA: Diagnosis not present

## 2022-09-16 DIAGNOSIS — M150 Primary generalized (osteo)arthritis: Secondary | ICD-10-CM | POA: Diagnosis not present

## 2022-09-16 DIAGNOSIS — M21611 Bunion of right foot: Secondary | ICD-10-CM | POA: Diagnosis not present

## 2022-09-25 DIAGNOSIS — M19011 Primary osteoarthritis, right shoulder: Secondary | ICD-10-CM | POA: Diagnosis not present

## 2022-09-25 DIAGNOSIS — M25511 Pain in right shoulder: Secondary | ICD-10-CM | POA: Diagnosis not present

## 2022-10-01 ENCOUNTER — Other Ambulatory Visit: Payer: Self-pay | Admitting: Cardiovascular Disease

## 2022-10-14 DIAGNOSIS — G894 Chronic pain syndrome: Secondary | ICD-10-CM | POA: Diagnosis not present

## 2022-10-14 DIAGNOSIS — M19011 Primary osteoarthritis, right shoulder: Secondary | ICD-10-CM | POA: Diagnosis not present

## 2022-10-14 DIAGNOSIS — M25511 Pain in right shoulder: Secondary | ICD-10-CM | POA: Diagnosis not present

## 2022-10-14 DIAGNOSIS — M150 Primary generalized (osteo)arthritis: Secondary | ICD-10-CM | POA: Diagnosis not present

## 2022-10-15 ENCOUNTER — Other Ambulatory Visit: Payer: Self-pay | Admitting: Anesthesiology

## 2022-10-15 DIAGNOSIS — M25511 Pain in right shoulder: Secondary | ICD-10-CM

## 2022-10-26 ENCOUNTER — Other Ambulatory Visit: Payer: Self-pay | Admitting: Family Medicine

## 2022-10-26 DIAGNOSIS — E559 Vitamin D deficiency, unspecified: Secondary | ICD-10-CM

## 2022-10-28 NOTE — Telephone Encounter (Signed)
LMTCB-Ok for [facility] Nurse to advise 

## 2022-10-28 NOTE — Telephone Encounter (Signed)
Message read to pt, verbalizes understanding. States she will take OTC Vit. D 2000u. States has appt with Dr. Beryle Flock Dec 2nd and can f/u then.  Pt also states Dr. Venia Carbon at [facility] has not received requested hard copies of pt's labs and imaging.

## 2022-11-04 ENCOUNTER — Ambulatory Visit: Payer: Medicare Other | Attending: Cardiovascular Disease | Admitting: Cardiovascular Disease

## 2022-11-04 ENCOUNTER — Encounter: Payer: Self-pay | Admitting: Cardiovascular Disease

## 2022-11-04 VITALS — BP 134/72 | HR 73 | Ht 62.0 in | Wt 214.1 lb

## 2022-11-04 DIAGNOSIS — E785 Hyperlipidemia, unspecified: Secondary | ICD-10-CM | POA: Diagnosis not present

## 2022-11-04 DIAGNOSIS — I2510 Atherosclerotic heart disease of native coronary artery without angina pectoris: Secondary | ICD-10-CM | POA: Insufficient documentation

## 2022-11-04 DIAGNOSIS — I10 Essential (primary) hypertension: Secondary | ICD-10-CM | POA: Insufficient documentation

## 2022-11-04 NOTE — Progress Notes (Addendum)
Cardiology Office Note   Date:  11/04/2022   ID:  Olivia Dougherty, DOB 1950/02/19, MRN [id number]  PCP:  Erasmo Downer, MD  Cardiologist:   Lorine Bears, MD   Chief Complaint  Patient presents with   Follow-up    12 month f/u no complaints today. Meds reviewed verbally with pt.      History of Present Illness: Olivia Dougherty is a 72 y.o. female who is here today for follow-up visit regarding coronary artery disease.  She underwent LAD PCI and Cypher drug-eluting stent placement with balloon angioplasty of diagonal in 2005 by Dr. Alanda Amass.  No cardiac events since then.  Cardiac catheterization in 2010 showed patent stent with minimal restenosis. She has known history of hyperlipidemia with intolerance to most statins due to myalgia.  She had CTA of the coronary arteries in 2017 which showed minimal plaque with no obstructive disease and patent LAD stent. Echocardiogram in September 2022 showed normal LV systolic function with no significant valvular abnormalities.  She has been doing very well overall with no recent chest pain or worsening dyspnea.  No palpitations or dizziness.  She is mostly bothered by generalized arthritis.   Past Medical History:  Diagnosis Date   Anxiety    Arthritis    lower back   Basal cell carcinoma 11/2011   face, left leg-using chemo cream on temple area   Coronary artery disease 2005   LAD PCI with Cypher drug-eluting stent placement 3.0 x 13 mm with balloon angioplasty of diagonal.  Cardiac catheterization in 2010 showed patent stent with minimal restenosis.   Diverticulitis 12/11/2020   GERD (gastroesophageal reflux disease)    Heart disease    Heart problem    98% blockage   Hypercholesteremia    Hypertension    Hypothyroidism    Kidney stone    Osteoarthritis    PONV (postoperative nausea and vomiting)    after 1 procedure approx 10 yrs ago   Sleep apnea    mild, could not tol cpap   Squamous cell carcinoma  of skin    leg    Past Surgical History:  Procedure Laterality Date   ANGIOPLASTY  2005   CAD   98% blockage   CARDIAC CATHETERIZATION  02/2008   CATARACT EXTRACTION Right    CATARACT EXTRACTION W/PHACO Right 08/31/2019   Procedure: CATARACT EXTRACTION PHACO AND INTRAOCULAR LENS PLACEMENT (IOC) RIGHT;  Surgeon: Nevada Crane, MD;  Location: [facility];  Service: Ophthalmology;  Laterality: Right;  4.41 0:30.7   CATARACT EXTRACTION W/PHACO Left 09/26/2019   Procedure: CATARACT EXTRACTION PHACO AND INTRAOCULAR LENS PLACEMENT (IOC) LEFT 1.60 00:20.3;  Surgeon: Nevada Crane, MD;  Location: [facility];  Service: Ophthalmology;  Laterality: Left;   COLONOSCOPY WITH PROPOFOL N/A 12/29/2014   Procedure: COLONOSCOPY WITH PROPOFOL;  Surgeon: Midge Minium, MD;  Location: [facility];  Service: Endoscopy;  Laterality: N/A;   COLONOSCOPY WITH PROPOFOL N/A 12/31/2017   Procedure: COLONOSCOPY WITH PROPOFOL;  Surgeon: Midge Minium, MD;  Location: [facility];  Service: Endoscopy;  Laterality: N/A;  sleep apnea   COLONOSCOPY WITH PROPOFOL N/A 12/31/2020   Procedure: COLONOSCOPY WITH PROPOFOL;  Surgeon: Midge Minium, MD;  Location: [facility];  Service: Endoscopy;  Laterality: N/A;   CORONARY ANGIOPLASTY  2005   LAD   EYE SURGERY Right    HYSTEROSCOPY  11/2001   resection of benign leiomyoma   KNEE ARTHROSCOPY Right 08/16/2014   Procedure: ARTHROSCOPY KNEE,  partial lateral menisectomy and chondroplasty;  Surgeon: Donato Heinz, MD;  Location: [facility];  Service: Orthopedics;  Laterality: Right;   POLYPECTOMY  12/29/2014   Procedure: POLYPECTOMY;  Surgeon: Midge Minium, MD;  Location: [facility];  Service: Endoscopy;;   SKIN CANCER EXCISION Right 02/2018   Right leg - Squamous    THYROID CYST EXCISION Left 10/2002   left lobe thyroid excision   THYROIDECTOMY, PARTIAL Left    TUBAL LIGATION  1980   BTL   UMBILICAL HERNIA REPAIR  1980   vulvar  cancer  1993   laser     Current Outpatient Medications  Medication Sig Dispense Refill   aspirin 81 MG tablet Take 81 mg by mouth daily.     augmented betamethasone dipropionate (DIPROLENE-AF) 0.05 % cream Apply 1 application topically as needed. For red bumps     Cholecalciferol (VITAMIN D3) 50 MCG (2000 UT) CAPS Take by mouth daily.     clopidogrel (PLAVIX) 75 MG tablet TAKE 1 TABLET BY MOUTH DAILY WITH BREAKFAST 90 tablet 0   ezetimibe (ZETIA) 10 MG tablet TAKE 1 TABLET BY MOUTH DAILY 90 tablet 3   levothyroxine (SYNTHROID) 75 MCG tablet TAKE 1 TABLET BY MOUTH DAILY BEFORE BREAKFAST 90 tablet 3   losartan (COZAAR) 100 MG tablet TAKE 1 TABLET BY MOUTH DAILY 90 tablet 0   metoprolol tartrate (LOPRESSOR) 25 MG tablet TAKE ONE TABLET BY MOUTH TWICE A DAY 180 tablet 3   Multiple Vitamins-Minerals (MULTIVITAMIN PO) Take by mouth daily.     rosuvastatin (CRESTOR) 10 MG tablet TAKE 1 TABLET BY MOUTH DAILY 90 tablet 0   No current facility-administered medications for this visit.    Allergies:   Atorvastatin, Codeine, Ace inhibitors, Enalapril, Penicillins, Sulfa antibiotics, and Adhesive [tape]    Social History:  The patient  reports that she has never smoked. She has never used smokeless tobacco. She reports current alcohol use of about 2.0 standard drinks of alcohol per week. She reports that she does not use drugs.   Family History:  The patient's family history includes Cervical cancer in her sister; Diabetes in her father; Heart disease in her father; Hypertension in her father and mother; Stroke in her sister.    ROS:  Please see the history of present illness.   Otherwise, review of systems are positive for none.   All other systems are reviewed and negative.    PHYSICAL EXAM: VS:  BP 134/72 (BP Location: Left Arm, Patient Position: Sitting, Cuff Size: Normal)   Pulse 73   Ht 5\' 2"  (1.575 m)   Wt 214 lb 2 oz (97.1 kg)   LMP 01/28/2007 (Approximate)   SpO2 98%   BMI 39.16  kg/m  , BMI Body mass index is 39.16 kg/m. GEN: Well nourished, well developed, in no acute distress  HEENT: normal  Neck: no JVD, carotid bruits, or masses Cardiac: RRR; no  rubs, or gallops,no edema .  1/6 systolic ejection murmur in the aortic area Respiratory:  clear to auscultation bilaterally, normal work of breathing GI: soft, nontender, nondistended, + BS MS: no deformity or atrophy  Skin: warm and dry, no rash Neuro:  Strength and sensation are intact Psych: euthymic mood, full affect   EKG:  EKG is ordered today. The ekg ordered today demonstrates: Normal sinus rhythm Normal ECG When compared with ECG of 30-Dec-2003 06:37, No significant change was found    Recent Labs: 12/23/2021: ALT 18 06/24/2022: BUN 16; Creatinine, Ser 0.78; Potassium 4.4;  Sodium 142; TSH 1.130    Lipid Panel    Component Value Date/Time   CHOL 161 12/23/2021 1302   TRIG 119 12/23/2021 1302   HDL 65 12/23/2021 1302   CHOLHDL 2.4 12/18/2020 0857   CHOLHDL 2.8 05/21/2017 0957   VLDL 22 05/21/2017 0957   LDLCALC 75 12/23/2021 1302      Wt Readings from Last 3 Encounters:  11/04/22 214 lb 2 oz (97.1 kg)  06/24/22 214 lb 9.6 oz (97.3 kg)  05/13/22 220 lb (99.8 kg)           No data to display            ASSESSMENT AND PLAN:  1.  Coronary artery disease involving native coronary arteries without angina: Given that she had first generation drug-eluting stent to the LAD, I favor continued lifelong dual antiplatelet therapy as tolerated.    2.  Hyperlipidemia with intolerance to statins.  She tolerates small dose rosuvastatin and ezetimibe.  Most recent lipid profile showed an LDL of 75 which is close to target.  3.  Essential hypertension: Blood pressure is well controlled on current medications.   Disposition:   FU with me in 12 months  Signed,  Lorine Bears, MD  11/04/2022 2:57 PM    [facility]

## 2022-11-04 NOTE — Patient Instructions (Signed)
Medication Instructions:  No changes *If you need a refill on your cardiac medications before your next appointment, please call your pharmacy*   Lab Work: None ordered If you have labs (blood work) drawn today and your tests are completely normal, you will receive your results only by: MyChart Message (if you have MyChart) OR A paper copy in the mail If you have any lab test that is abnormal or we need to change your treatment, we will call you to review the results.   Testing/Procedures: None ordered   Follow-Up: At [facility], you and your health needs are our priority.  As part of our continuing mission to provide you with exceptional heart care, we have created designated Provider Care Teams.  These Care Teams include your primary Cardiologist (physician) and Advanced Practice Providers (APPs -  Physician Assistants and Nurse Practitioners) who all work together to provide you with the care you need, when you need it.  We recommend signing up for the patient portal called "MyChart".  Sign up information is provided on this After Visit Summary.  MyChart is used to connect with patients for Virtual Visits (Telemedicine).  Patients are able to view lab/test results, encounter notes, upcoming appointments, etc.  Non-urgent messages can be sent to your provider as well.   To learn more about what you can do with MyChart, go to [email or web address].    Your next appointment:   12 month(s)  Provider:   You may see Muhammad Arida, MD or one of the following Advanced Practice Providers on your designated Care Team:   Christopher Berge, NP Ryan Dunn, PA-C Cadence Furth, PA-C Sheri Hammock, NP    

## 2022-11-08 ENCOUNTER — Ambulatory Visit
Admission: RE | Admit: 2022-11-08 | Discharge: 2022-11-08 | Disposition: A | Discharge status: Home / Self Care | Payer: Medicare Other | Source: Ambulatory Visit | Attending: Anesthesiology | Admitting: Anesthesiology

## 2022-11-08 DIAGNOSIS — M25511 Pain in right shoulder: Secondary | ICD-10-CM

## 2022-11-08 DIAGNOSIS — M7581 Other shoulder lesions, right shoulder: Secondary | ICD-10-CM | POA: Diagnosis not present

## 2022-11-08 DIAGNOSIS — M7551 Bursitis of right shoulder: Secondary | ICD-10-CM | POA: Diagnosis not present

## 2022-11-11 DIAGNOSIS — Z23 Encounter for immunization: Secondary | ICD-10-CM | POA: Diagnosis not present

## 2022-11-12 ENCOUNTER — Telehealth: Payer: Self-pay | Admitting: Family Medicine

## 2022-11-12 NOTE — Telephone Encounter (Signed)
Patient states that she sees Dr Thyra Breed at the pain clinic in [place] and Dr B was supposed to send over her lab work she had done in May and also a copy of XRAY's she had done as well to their office. Patient states the pain clinic has not received this information and patient was following up on this.   Patients callback # [phone]  Pain Clinic: Dr Thyra Breed Address: [address] [place], [place] [address] Phone: [phone]

## 2022-11-13 ENCOUNTER — Telehealth: Payer: Self-pay | Admitting: Family Medicine

## 2022-11-13 NOTE — Telephone Encounter (Signed)
Do we need her to sign an ROI?  Ok to send per protocol.

## 2022-11-13 NOTE — Telephone Encounter (Signed)
Spoke with patient to verify what imaging she would like sent. She reports having imaging completed in May at [facility] that she needs Dr.B to send over. Advised I do not see any XR Imaging completed in May so we would not be able to fax that over but she should contact [facility] and ask for them to fax over that information. Patient verbalized understanding. Advised I would fax over her labs from May.  Labs printed to be faxed.

## 2022-11-13 NOTE — Telephone Encounter (Signed)
Pt is calling in returning a call from the office. Pt says she believes it's regarding her Vitamin D. Please follow up with pt.

## 2022-11-13 NOTE — Telephone Encounter (Signed)
LVMTCB. CRM created. Ok for [facility] to advise/clarify.

## 2022-11-13 NOTE — Telephone Encounter (Signed)
Contacted patient. Please see telephone encounter from 11/11/22

## 2022-11-15 ENCOUNTER — Other Ambulatory Visit: Payer: Self-pay | Admitting: Cardiovascular Disease

## 2022-11-21 DIAGNOSIS — M75121 Complete rotator cuff tear or rupture of right shoulder, not specified as traumatic: Secondary | ICD-10-CM | POA: Diagnosis not present

## 2022-11-21 DIAGNOSIS — M19011 Primary osteoarthritis, right shoulder: Secondary | ICD-10-CM | POA: Diagnosis not present

## 2022-11-21 DIAGNOSIS — M25511 Pain in right shoulder: Secondary | ICD-10-CM | POA: Diagnosis not present

## 2022-11-21 DIAGNOSIS — G894 Chronic pain syndrome: Secondary | ICD-10-CM | POA: Diagnosis not present

## 2022-12-09 ENCOUNTER — Ambulatory Visit (INDEPENDENT_AMBULATORY_CARE_PROVIDER_SITE_OTHER): Payer: Medicare Other | Admitting: Family Medicine

## 2022-12-09 ENCOUNTER — Other Ambulatory Visit: Payer: Self-pay | Admitting: Family Medicine

## 2022-12-09 ENCOUNTER — Telehealth: Payer: Self-pay | Admitting: Family Medicine

## 2022-12-09 VITALS — BP 130/66 | HR 76 | Temp 97.5°F | Resp 18 | Ht 62.0 in | Wt 215.0 lb

## 2022-12-09 DIAGNOSIS — H9202 Otalgia, left ear: Secondary | ICD-10-CM

## 2022-12-09 DIAGNOSIS — L03211 Cellulitis of face: Secondary | ICD-10-CM

## 2022-12-09 MED ORDER — DOXYCYCLINE HYCLATE 100 MG PO TBEC: 100.0000 mg | DELAYED_RELEASE_TABLET | Freq: Two times a day (BID) | ORAL | 0 refills | Status: DC

## 2022-12-09 MED ORDER — DOXYCYCLINE HYCLATE 100 MG PO TABS: 100.0000 mg | ORAL_TABLET | Freq: Two times a day (BID) | ORAL | 0 refills | Status: AC

## 2022-12-09 NOTE — Progress Notes (Signed)
Established patient visit   Patient: Olivia Dougherty   DOB: 07/24/1950   72 y.o. Female  MRN: [id number] Visit Date: 12/09/2022  Today's healthcare provider: Ronnald Ramp, MD   Chief Complaint  Patient presents with   Ear Pain    Left, started Saturday   Subjective     HPI     Ear Pain    Additional comments: Left, started Saturday      Last edited by Ashok Cordia, CMA on 12/09/2022  2:02 PM.       Discussed the use of AI scribe software for clinical note transcription with the patient, who gave verbal consent to proceed.  History of Present Illness   The patient, with a history of penicillin and sulfa antibiotic allergies, presents with a four-day history of left ear pain. The pain is localized to the mid-canal and extends to the top of the ear, with no associated sore throat or pain behind the ear. The patient has been managing the pain with Tylenol and heat application, with the latter providing more relief. The patient also reports a history of itchiness in the ear canal a couple of weeks prior to the onset of the current symptoms. The patient denies any deep ear pain, recent bug bites, or other significant changes in health. The patient has a history of sinus infection treated by a PA in the previous year.         Past Medical History:  Diagnosis Date   Anxiety    Arthritis    lower back   Basal cell carcinoma 11/2011   face, left leg-using chemo cream on temple area   Coronary artery disease 2005   LAD PCI with Cypher drug-eluting stent placement 3.0 x 13 mm with balloon angioplasty of diagonal.  Cardiac catheterization in 2010 showed patent stent with minimal restenosis.   Diverticulitis 12/11/2020   GERD (gastroesophageal reflux disease)    Heart disease    Heart problem    98% blockage   Hypercholesteremia    Hypertension    Hypothyroidism    Kidney stone    Osteoarthritis    PONV (postoperative nausea and vomiting)     after 1 procedure approx 10 yrs ago   Sleep apnea    mild, could not tol cpap   Squamous cell carcinoma of skin    leg    Medications: Outpatient Medications Prior to Visit  Medication Sig   aspirin 81 MG tablet Take 81 mg by mouth daily.   augmented betamethasone dipropionate (DIPROLENE-AF) 0.05 % cream Apply 1 application topically as needed. For red bumps   Cholecalciferol (VITAMIN D3) 50 MCG (2000 UT) CAPS Take by mouth daily.   clopidogrel (PLAVIX) 75 MG tablet TAKE 1 TABLET BY MOUTH DAILY WITH BREAKFAST   ezetimibe (ZETIA) 10 MG tablet TAKE 1 TABLET BY MOUTH DAILY   gabapentin (NEURONTIN) 100 MG capsule Take 100 mg by mouth 2 (two) times daily.   levothyroxine (SYNTHROID) 75 MCG tablet TAKE 1 TABLET BY MOUTH DAILY BEFORE BREAKFAST   losartan (COZAAR) 100 MG tablet TAKE 1 TABLET BY MOUTH DAILY   metoprolol tartrate (LOPRESSOR) 25 MG tablet TAKE 1 TABLET BY MOUTH TWICE A DAY   Multiple Vitamins-Minerals (MULTIVITAMIN PO) Take by mouth daily.   rosuvastatin (CRESTOR) 10 MG tablet TAKE 1 TABLET BY MOUTH DAILY   No facility-administered medications prior to visit.    Review of Systems      Objective    BP 130/66  Pulse 76   Temp (!) 97.5 F (36.4 C)   Resp 18   Ht 5\' 2"  (1.575 m)   Wt 215 lb (97.5 kg)   LMP 01/28/2007 (Approximate)   SpO2 98%   BMI 39.32 kg/m        Physical Exam  Physical Exam   HEENT: Left ear canal appears irritated and slightly erythematous with mild swelling and tenderness anteriorly. Tympanic membrane not red or bulging, small amount of fluid behind it suggesting mild effusion. A small, tender lymph node palpable anterior to the left ear. No maxillary or frontal sinus tenderness.       No results found for any visits on 12/09/22.  Assessment & Plan     Problem List Items Addressed This Visit   None Visit Diagnoses     Acute otalgia, left    -  Primary   Relevant Medications   doxycycline (DORYX) 100 MG EC tablet   Facial  cellulitis       Relevant Medications   doxycycline (DORYX) 100 MG EC tablet         Otitis Externa with Mild Facial Cellulitis Presents with a 4-day history of left ear pain, extending to the top of the ear, not alleviated by acetaminophen, associated with fullness and tenderness anterior to the left ear. Physical exam reveals slight swelling and tenderness anterior to the left ear, with minor fluctuation. Tympanic membranes not erythematous or bulging, fluid behind left eardrum indicating congestion. Allergic to penicillin and sulfa antibiotics. Differential includes otitis externa and mild facial cellulitis. Decision to treat with doxycycline due to antibiotic allergies and need to address both conditions. Risks and benefits of doxycycline discussed, including nausea and stomach ache. Agreed to treatment plan. - Prescribe doxycycline 100 mg BID for 10 days - Follow up in 10 days if symptoms persist     Return in about 10 days (around 12/19/2022), or if symptoms worsen or fail to improve.         Ronnald Ramp, MD  [facility] [phone] (phone) [phone] (fax)  [facility]

## 2022-12-09 NOTE — Telephone Encounter (Signed)
Olivia Dougherty from [facility] pharmacy called in states the med, the form of doxycycline that Dr Kathaleen Maser prescribed is not generally available at pharmacies and is too expensive. He is asking fro the regular kind to be prescribed.

## 2022-12-09 NOTE — Telephone Encounter (Signed)
Will forward to MD that ordered the medication

## 2022-12-09 NOTE — Telephone Encounter (Signed)
Replacement ordered

## 2022-12-09 NOTE — Patient Instructions (Signed)
VISIT SUMMARY:  You came in today with a four-day history of left ear pain, which extends to the top of your ear. You have been using Tylenol and heat to manage the pain, with heat providing more relief. You also mentioned experiencing itchiness in the ear canal a couple of weeks ago. There is no sore throat or pain behind the ear, and you have no recent bug bites or other significant health changes.  YOUR PLAN:  -OTITIS EXTERNA WITH MILD FACIAL CELLULITIS: Otitis externa, also known as swimmer's ear, is an infection of the outer ear canal, and facial cellulitis is a bacterial skin infection. You have been prescribed doxycycline 100 mg to be taken twice a day for 10 days due to your allergies to penicillin and sulfa antibiotics. This medication will help treat both the ear infection and the skin infection. Please be aware that doxycycline can cause nausea and stomach ache. If your symptoms persist, please follow up in 10 days.  INSTRUCTIONS:  Please follow up in 10 days if your symptoms persist. Additionally, your yearly physical is scheduled for December 29, 2022.

## 2022-12-18 ENCOUNTER — Ambulatory Visit: Payer: Medicare Other | Admitting: Family Medicine

## 2022-12-29 ENCOUNTER — Ambulatory Visit (INDEPENDENT_AMBULATORY_CARE_PROVIDER_SITE_OTHER): Payer: Medicare Other | Admitting: Family Medicine

## 2022-12-29 ENCOUNTER — Encounter: Payer: Self-pay | Admitting: Family Medicine

## 2022-12-29 ENCOUNTER — Other Ambulatory Visit (HOSPITAL_COMMUNITY)
Admission: RE | Admit: 2022-12-29 | Discharge: 2022-12-29 | Disposition: A | Discharge status: Home / Self Care | Payer: Medicare Other | Source: Ambulatory Visit | Attending: Obstetrics & Gynecology | Admitting: Obstetrics & Gynecology

## 2022-12-29 ENCOUNTER — Ambulatory Visit (HOSPITAL_BASED_OUTPATIENT_CLINIC_OR_DEPARTMENT_OTHER): Payer: Medicare Other | Admitting: Obstetrics & Gynecology

## 2022-12-29 ENCOUNTER — Encounter (HOSPITAL_BASED_OUTPATIENT_CLINIC_OR_DEPARTMENT_OTHER): Payer: Self-pay | Admitting: Obstetrics & Gynecology

## 2022-12-29 ENCOUNTER — Encounter: Payer: Medicare Other | Admitting: Family Medicine

## 2022-12-29 VITALS — BP 112/52 | HR 71 | Ht 62.0 in | Wt 215.4 lb

## 2022-12-29 VITALS — BP 130/75 | HR 60 | Resp 16 | Ht 62.0 in | Wt 216.5 lb

## 2022-12-29 DIAGNOSIS — Z124 Encounter for screening for malignant neoplasm of cervix: Secondary | ICD-10-CM | POA: Diagnosis not present

## 2022-12-29 DIAGNOSIS — I2510 Atherosclerotic heart disease of native coronary artery without angina pectoris: Secondary | ICD-10-CM

## 2022-12-29 DIAGNOSIS — E559 Vitamin D deficiency, unspecified: Secondary | ICD-10-CM | POA: Diagnosis not present

## 2022-12-29 DIAGNOSIS — I10 Essential (primary) hypertension: Secondary | ICD-10-CM

## 2022-12-29 DIAGNOSIS — N9089 Other specified noninflammatory disorders of vulva and perineum: Secondary | ICD-10-CM

## 2022-12-29 DIAGNOSIS — Z01419 Encounter for gynecological examination (general) (routine) without abnormal findings: Secondary | ICD-10-CM

## 2022-12-29 DIAGNOSIS — Z79899 Other long term (current) drug therapy: Secondary | ICD-10-CM | POA: Diagnosis not present

## 2022-12-29 DIAGNOSIS — Z6839 Body mass index (BMI) 39.0-39.9, adult: Secondary | ICD-10-CM | POA: Diagnosis not present

## 2022-12-29 DIAGNOSIS — F411 Generalized anxiety disorder: Secondary | ICD-10-CM | POA: Diagnosis not present

## 2022-12-29 DIAGNOSIS — E785 Hyperlipidemia, unspecified: Secondary | ICD-10-CM

## 2022-12-29 DIAGNOSIS — E039 Hypothyroidism, unspecified: Secondary | ICD-10-CM | POA: Diagnosis not present

## 2022-12-29 DIAGNOSIS — E2839 Other primary ovarian failure: Secondary | ICD-10-CM

## 2022-12-29 DIAGNOSIS — Z0000 Encounter for general adult medical examination without abnormal findings: Secondary | ICD-10-CM

## 2022-12-29 DIAGNOSIS — E7800 Pure hypercholesterolemia, unspecified: Secondary | ICD-10-CM

## 2022-12-29 DIAGNOSIS — Z780 Asymptomatic menopausal state: Secondary | ICD-10-CM

## 2022-12-29 DIAGNOSIS — Z1231 Encounter for screening mammogram for malignant neoplasm of breast: Secondary | ICD-10-CM

## 2022-12-29 NOTE — Assessment & Plan Note (Signed)
Well-managed on current medication. - Continue Synthroid (levothyroxine) 75 mcg daily

## 2022-12-29 NOTE — Assessment & Plan Note (Signed)
Blood pressure well-controlled on current medications. - Continue losartan 100 mg daily - Continue metoprolol 25 mg twice daily

## 2022-12-29 NOTE — Progress Notes (Signed)
Annual Wellness Visit     Patient: Olivia Dougherty, Female    DOB: [birth year], 72 y.o.   MRN: [id number]  Subjective  Chief Complaint  Patient presents with   Medicare Wellness   Annual Exam    Patient reports overall feeling well. She is exercising. She walks about 30 minutes 2 times a week. She is sleeping fairly well.    Olivia Dougherty is a 72 y.o. female who presents today for her Annual Wellness Visit. She reports consuming a general diet.  She generally feels well. She reports sleeping fairly well. She does have additional problems to discuss today.   HPI  Discussed the use of AI scribe software for clinical note transcription with the patient, who gave verbal consent to proceed.  History of Present Illness   The patient, with a history of hypertension and hyperlipidemia, presents with a nearly completely torn rotator cuff, causing significant pain and discomfort. The patient is scheduled to see a shoulder surgeon in the coming week. The patient also recently experienced an ear infection, which was successfully treated with a course of antibiotics. The patient reports no other significant health issues at this time. The patient's current medication regimen includes losartan, metoprolol, rosuvastatin, Zetia, Plavix, baby aspirin, levothyroxine, and recently added gabapentin for pain management. The patient reports minimal relief from the gabapentin and experiences dry mouth and occasional headaches as side effects. The patient also mentions a recent gynecologist appointment where a potential issue was identified, and a biopsy was scheduled for further investigation.            Medications: Outpatient Medications Prior to Visit  Medication Sig   aspirin 81 MG tablet Take 81 mg by mouth daily.   augmented betamethasone dipropionate (DIPROLENE-AF) 0.05 % cream Apply 1 application topically as needed. For red bumps   Cholecalciferol (VITAMIN D3) 50 MCG (2000 UT)  CAPS Take by mouth daily.   clopidogrel (PLAVIX) 75 MG tablet TAKE 1 TABLET BY MOUTH DAILY WITH BREAKFAST   ezetimibe (ZETIA) 10 MG tablet TAKE 1 TABLET BY MOUTH DAILY   gabapentin (NEURONTIN) 100 MG capsule Take 100 mg by mouth 2 (two) times daily.   levothyroxine (SYNTHROID) 75 MCG tablet TAKE 1 TABLET BY MOUTH DAILY BEFORE BREAKFAST   losartan (COZAAR) 100 MG tablet TAKE 1 TABLET BY MOUTH DAILY   metoprolol tartrate (LOPRESSOR) 25 MG tablet TAKE 1 TABLET BY MOUTH TWICE A DAY   Multiple Vitamins-Minerals (MULTIVITAMIN PO) Take by mouth daily.   rosuvastatin (CRESTOR) 10 MG tablet TAKE 1 TABLET BY MOUTH DAILY   No facility-administered medications prior to visit.    Allergies  Allergen Reactions   Atorvastatin Other (See Comments)    Other reaction(s): myalgia   Codeine Shortness Of Breath   Ace Inhibitors Cough        Enalapril Cough   Penicillins Hives        Sulfa Antibiotics Hives and Swelling        Adhesive [Tape] Rash         Patient Care Team: Erasmo Downer, MD as PCP - General (Family Medicine) Iran Ouch, MD as PCP - Cardiology (Cardiology) Midge Minium, MD as Consulting Physician (Gastroenterology) Debbrah Alar, MD (Dermatology) Nevada Crane, MD as Consulting Physician (Ophthalmology) Stephannie Li, MD as Consulting Physician (Ophthalmology) Geanie Logan, MD as Referring Physician (Otolaryngology) Thyra Breed, MD (Anesthesiology)  ROS      Objective  BP 130/75 (BP Location: Left Arm, Patient Position: Sitting, Cuff  Size: Large)   Pulse 60   Resp 16   Ht 5\' 2"  (1.575 m)   Wt 216 lb 8 oz (98.2 kg)   LMP 01/28/2007 (Approximate)   BMI 39.60 kg/m    Physical Exam Vitals reviewed.  Constitutional:      General: She is not in acute distress.    Appearance: Normal appearance. She is well-developed. She is not diaphoretic.  HENT:     Head: Normocephalic and atraumatic.     Right Ear: Tympanic membrane, ear canal and  external ear normal.     Left Ear: Tympanic membrane, ear canal and external ear normal.     Nose: Nose normal.     Mouth/Throat:     Mouth: Mucous membranes are moist.     Pharynx: Oropharynx is clear. No oropharyngeal exudate.  Eyes:     General: No scleral icterus.    Conjunctiva/sclera: Conjunctivae normal.     Pupils: Pupils are equal, round, and reactive to light.  Neck:     Thyroid: No thyromegaly.  Cardiovascular:     Rate and Rhythm: Normal rate and regular rhythm.     Heart sounds: Normal heart sounds. No murmur heard. Pulmonary:     Effort: Pulmonary effort is normal. No respiratory distress.     Breath sounds: Normal breath sounds. No wheezing, rhonchi or rales.  Abdominal:     General: There is no distension.     Palpations: Abdomen is soft.     Tenderness: There is no abdominal tenderness.  Musculoskeletal:        General: No deformity.     Cervical back: Neck supple.     Right lower leg: No edema.     Left lower leg: No edema.  Lymphadenopathy:     Cervical: No cervical adenopathy.  Skin:    General: Skin is warm and dry.     Findings: No rash.  Neurological:     Mental Status: She is alert and oriented to person, place, and time. Mental status is at baseline.     Gait: Gait normal.  Psychiatric:        Mood and Affect: Mood normal.        Behavior: Behavior normal.        Thought Content: Thought content normal.       Most recent functional status assessment:    12/29/2022    2:30 PM  In your present state of health, do you have any difficulty performing the following activities:  Hearing? 0  Vision? 0  Difficulty concentrating or making decisions? 0  Walking or climbing stairs? 0  Dressing or bathing? 0  Doing errands, shopping? 0   Most recent fall risk assessment:    12/29/2022    9:21 AM  Fall Risk   Falls in the past year? 0  Number falls in past yr: 0  Injury with Fall? 0    Most recent depression screenings:    12/29/2022    9:21  AM 12/09/2022    2:48 PM  PHQ 2/9 Scores  PHQ - 2 Score 0 0  PHQ- 9 Score  0   Most recent cognitive screening:    12/29/2022    2:30 PM  6CIT Screen  What Year? 0 points  What month? 0 points  What time? 0 points  Count back from 20 0 points  Months in reverse 0 points  Repeat phrase 0 points  Total Score 0 points   Most recent Audit-C alcohol use  screening    12/23/2022   10:55 AM  Alcohol Use Disorder Test (AUDIT)  1. How often do you have a drink containing alcohol? 1  2. How many drinks containing alcohol do you have on a typical day when you are drinking? 0  3. How often do you have six or more drinks on one occasion? 0  AUDIT-C Score 1   A score of 3 or more in women, and 4 or more in men indicates increased risk for alcohol abuse, EXCEPT if all of the points are from question 1   Vision/Hearing Screen: No results found.    No results found for any visits on 12/29/22.    Assessment & Plan   Annual wellness visit done today including the all of the following: Reviewed patient's Family Medical History Reviewed and updated list of patient's medical providers Assessment of cognitive impairment was done Assessed patient's functional ability Established a written schedule for health screening services Health Risk Assessent Completed and Reviewed  Exercise Activities and Dietary recommendations  Goals       DIET - DECREASE SODA OR JUICE INTAKE      Recommend to continue cutting back on soda in take to 1 or less a day.       DIET - EAT MORE FRUITS AND VEGETABLES      Exercise 3x per week (30 min per time)      Recommend to exercise for 3 days a week for at least 30 minutes at a time.       Weight (lb) < 200 lb (90.7 kg) (pt-stated)      Lose 50 lbs in a year        Immunization History  Administered Date(s) Administered   Fluad Quad(high Dose 65+) 10/06/2018, 11/02/2019, 11/21/2020   Influenza, High Dose Seasonal PF 10/21/2017    Influenza-Unspecified 12/27/2014, 10/28/2015, 12/10/2021, 11/11/2022   PFIZER Comirnaty(Gray Top)Covid-19 Tri-Sucrose Vaccine 05/02/2020   PFIZER(Purple Top)SARS-COV-2 Vaccination 03/31/2019, 04/21/2019, 11/03/2019   Pfizer Covid-19 Vaccine Bivalent Booster 41yrs & up 10/30/2020   Pneumococcal Conjugate-13 08/10/2017   Pneumococcal Polysaccharide-23 12/10/2018   Tdap 10/28/2010, 03/01/2021   Zoster Recombinant(Shingrix) 08/19/2017, 12/09/2017    Health Maintenance  Topic Date Due   COVID-19 Vaccine (6 - 2023-24 season) 01/14/2023 (Originally 09/28/2022)   MAMMOGRAM  03/05/2023   Medicare Annual Wellness (AWV)  12/29/2023   Colonoscopy  01/01/2028   DTaP/Tdap/Td (3 - Td or Tdap) 03/02/2031   Pneumonia Vaccine 45+ Years old  Completed   INFLUENZA VACCINE  Completed   DEXA SCAN  Completed   Hepatitis C Screening  Completed   Zoster Vaccines- Shingrix  Completed   HPV VACCINES  Aged Out     Discussed health benefits of physical activity, and encouraged her to engage in regular exercise appropriate for her age and condition.     Problem List Items Addressed This Visit       Cardiovascular and Mediastinum   Coronary atherosclerosis of native coronary artery    Well-managed on current medications. - Continue Plavix 75 mg daily - Continue baby aspirin      Relevant Orders   CBC w/Diff/Platelet   Essential hypertension    Blood pressure well-controlled on current medications. - Continue losartan 100 mg daily - Continue metoprolol 25 mg twice daily      Relevant Orders   Comprehensive metabolic panel     Endocrine   Acquired hypothyroidism    Well-managed on current medication. - Continue Synthroid (levothyroxine) 75 mcg daily  Relevant Orders   TSH     Other   Hyperlipidemia    Cholesterol levels managed with current medications. - Continue rosuvastatin 10 mg daily - Continue Zetia 10 mg daily      Relevant Orders   Lipid panel   Comprehensive metabolic  panel   GAD (generalized anxiety disorder)    Chronic and stable Not on any medications Could consider SSRI in the future Avoid chronic benzos      Morbid obesity (HCC)    BMI 39 and assoc with HTN and HLD Discussed importance of healthy weight management Discussed diet and exercise       Vitamin D deficiency    Continue supplement Recheck level       Relevant Orders   VITAMIN D 25 Hydroxy (Vit-D Deficiency, Fractures)   Other Visit Diagnoses     Encounter for annual wellness visit (AWV) in Medicare patient    -  Primary   Long-term use of high-risk medication       Relevant Orders   CBC w/Diff/Platelet          Wellness Visit Annual wellness visit conducted. No acute concerns. Blood pressure well-controlled. Medications reviewed. Discussed recent gynecologist visit and upcoming biopsy.   Chronic Pain Chronic pain managed with gabapentin. Patient reports minimal relief and side effects (dry mouth, headache). Follow-up with pain management specialist scheduled. Discussed potential dose adjustment to improve efficacy. - Continue gabapentin as prescribed - Follow up with pain management specialist on December 23rd to discuss potential dose adjustment  Rotator Cuff Tear Confirmed via MRI. Significant pain reported. Appointment with shoulder surgeon scheduled. Discussed potential surgery and importance of post-operative physical therapy to prevent shoulder freezing. - Follow up with shoulder surgeon on Wednesday  General Health Maintenance Routine screenings and vaccinations up to date. Mammogram and bone density scan ordered by gynecologist. Discussed potential delay of mammogram due to shoulder surgery. - Schedule mammogram and bone density scan - Return for lab work (kidney and liver function, blood sugar, cholesterol, vitamin D, thyroid, blood counts) - Continue routine vaccinations (shingles, pneumonia, flu, tetanus)  Follow-up - Return for lab work tomorrow or  Wednesday - Schedule six-month follow-up visit.        Return in about 6 months (around 06/29/2023) for chronic disease f/u.     Shirlee Latch, MD

## 2022-12-29 NOTE — Assessment & Plan Note (Signed)
BMI 39 and assoc with HTN and HLD Discussed importance of healthy weight management Discussed diet and exercise

## 2022-12-29 NOTE — Assessment & Plan Note (Signed)
Cholesterol levels managed with current medications. - Continue rosuvastatin 10 mg daily - Continue Zetia 10 mg daily

## 2022-12-29 NOTE — Assessment & Plan Note (Signed)
Continue supplement Recheck level 

## 2022-12-29 NOTE — Assessment & Plan Note (Signed)
Chronic and stable Not on any medications Could consider SSRI in the future Avoid chronic benzos

## 2022-12-29 NOTE — Progress Notes (Signed)
72 y.o. G38P3003 Married White or Caucasian female here for breast and pelvic exam.  I am also following her for PMP status.  Denies vaginal bleeding.  Does need order for dexa scan.  Was last done 2019.   Has a torn rotator cuff with her right shoulder.  She cannot elevate her arm.  Has been waiting on the appointment for two months.  She has appt later this week with Dr. Rennis Chris.  H/o vulvar dysplasia.  No current itching or other symptoms.    H/o ovarian cyst.  Last u/s was 2021.  Cyst was <2cm.  She had a CT in 2022 due to LLQ pain.  Ovarian cyst not noted but likely due to small size.    Patient's last menstrual period was 01/28/2007 (approximate).          Sexually active: Yes.    H/O STD:  no  Health Maintenance: PCP:  Dr. Beryle Flock.  Last wellness is scheduled for this afternoon.   Vaccines are up to date:  reviewed with pt today Colonoscopy:  12/31/2020, follow up 7 years MMG:  03/04/2022 Negative BMD:  069/19/2019 Normal Last pap smear:  12/26/2020 Negative.   H/o abnormal pap smear:  no    reports that she has never smoked. She has never used smokeless tobacco. She reports current alcohol use of about 2.0 standard drinks of alcohol per week. She reports that she does not use drugs.  Past Medical History:  Diagnosis Date   Anxiety    Arthritis    lower back   Basal cell carcinoma 11/2011   face, left leg-using chemo cream on temple area   Coronary artery disease 2005   LAD PCI with Cypher drug-eluting stent placement 3.0 x 13 mm with balloon angioplasty of diagonal.  Cardiac catheterization in 2010 showed patent stent with minimal restenosis.   Diverticulitis 12/11/2020   GERD (gastroesophageal reflux disease)    Heart disease    Heart problem    98% blockage   Hypercholesteremia    Hypertension    Hypothyroidism    Kidney stone    Osteoarthritis    PONV (postoperative nausea and vomiting)    after 1 procedure approx 10 yrs ago   Sleep apnea    mild, could not  tol cpap   Squamous cell carcinoma of skin    leg    Past Surgical History:  Procedure Laterality Date   ANGIOPLASTY  2005   CAD   98% blockage   CARDIAC CATHETERIZATION  02/2008   CATARACT EXTRACTION Right    CATARACT EXTRACTION W/PHACO Right 08/31/2019   Procedure: CATARACT EXTRACTION PHACO AND INTRAOCULAR LENS PLACEMENT (IOC) RIGHT;  Surgeon: Nevada Crane, MD;  Location: [facility];  Service: Ophthalmology;  Laterality: Right;  4.41 0:30.7   CATARACT EXTRACTION W/PHACO Left 09/26/2019   Procedure: CATARACT EXTRACTION PHACO AND INTRAOCULAR LENS PLACEMENT (IOC) LEFT 1.60 00:20.3;  Surgeon: Nevada Crane, MD;  Location: [facility];  Service: Ophthalmology;  Laterality: Left;   COLONOSCOPY WITH PROPOFOL N/A 12/29/2014   Procedure: COLONOSCOPY WITH PROPOFOL;  Surgeon: Midge Minium, MD;  Location: [facility];  Service: Endoscopy;  Laterality: N/A;   COLONOSCOPY WITH PROPOFOL N/A 12/31/2017   Procedure: COLONOSCOPY WITH PROPOFOL;  Surgeon: Midge Minium, MD;  Location: [facility];  Service: Endoscopy;  Laterality: N/A;  sleep apnea   COLONOSCOPY WITH PROPOFOL N/A 12/31/2020   Procedure: COLONOSCOPY WITH PROPOFOL;  Surgeon: Midge Minium, MD;  Location: [facility];  Service:  Endoscopy;  Laterality: N/A;   CORONARY ANGIOPLASTY  2005   LAD   EYE SURGERY Right    HYSTEROSCOPY  11/2001   resection of benign leiomyoma   KNEE ARTHROSCOPY Right 08/16/2014   Procedure: ARTHROSCOPY KNEE, partial lateral menisectomy and chondroplasty;  Surgeon: Donato Heinz, MD;  Location: [facility];  Service: Orthopedics;  Laterality: Right;   POLYPECTOMY  12/29/2014   Procedure: POLYPECTOMY;  Surgeon: Midge Minium, MD;  Location: [facility];  Service: Endoscopy;;   SKIN CANCER EXCISION Right 02/2018   Right leg - Squamous    THYROID CYST EXCISION Left 10/2002   left lobe thyroid excision   THYROIDECTOMY, PARTIAL Left    TUBAL LIGATION  1980   BTL    UMBILICAL HERNIA REPAIR  1980   vulvar cancer  1993   laser    Current Outpatient Medications  Medication Sig Dispense Refill   aspirin 81 MG tablet Take 81 mg by mouth daily.     augmented betamethasone dipropionate (DIPROLENE-AF) 0.05 % cream Apply 1 application topically as needed. For red bumps     Cholecalciferol (VITAMIN D3) 50 MCG (2000 UT) CAPS Take by mouth daily.     clopidogrel (PLAVIX) 75 MG tablet TAKE 1 TABLET BY MOUTH DAILY WITH BREAKFAST 90 tablet 2   ezetimibe (ZETIA) 10 MG tablet TAKE 1 TABLET BY MOUTH DAILY 90 tablet 3   gabapentin (NEURONTIN) 100 MG capsule Take 100 mg by mouth 2 (two) times daily.     levothyroxine (SYNTHROID) 75 MCG tablet TAKE 1 TABLET BY MOUTH DAILY BEFORE BREAKFAST 90 tablet 3   losartan (COZAAR) 100 MG tablet TAKE 1 TABLET BY MOUTH DAILY 90 tablet 0   metoprolol tartrate (LOPRESSOR) 25 MG tablet TAKE 1 TABLET BY MOUTH TWICE A DAY 180 tablet 2   Multiple Vitamins-Minerals (MULTIVITAMIN PO) Take by mouth daily.     rosuvastatin (CRESTOR) 10 MG tablet TAKE 1 TABLET BY MOUTH DAILY 90 tablet 2   No current facility-administered medications for this visit.    Family History  Problem Relation Age of Onset   Hypertension Mother    Hypertension Father    Diabetes Father    Heart disease Father    Cervical cancer Sister    Stroke Sister    Colon cancer Neg Hx    Breast cancer Neg Hx    Ovarian cancer Neg Hx     Review of Systems  Constitutional: Negative.   Genitourinary: Negative.     Exam:   BP (!) 147/51 (BP Location: Left Arm, Patient Position: Sitting, Cuff Size: Large)   Pulse 70   Ht 5\' 2"  (1.575 m)   Wt 215 lb 6.4 oz (97.7 kg)   LMP 01/28/2007 (Approximate)   BMI 39.40 kg/m   Height: 5\' 2"  (157.5 cm)  General appearance: alert, cooperative and appears stated age Breasts: normal appearance, no masses or tenderness Abdomen: soft, non-tender; bowel sounds normal; no masses,  no organomegaly Lymph nodes: Cervical,  supraclavicular, and axillary nodes normal.  No abnormal inguinal nodes palpated Neurologic: Grossly normal  Pelvic: External genitalia:  pigmented lesion right inner labia major with some color variation              Urethra:  normal appearing urethra with no masses, tenderness or lesions              Bartholins and Skenes: normal                 Vagina: normal appearing  vagina with atrophic changes and no discharge, no lesions              Cervix: no lesions              Pap taken: Yes.   Bimanual Exam:  Uterus:  normal size, contour, position, consistency, mobility, non-tender              Adnexa: no mass, fullness, tenderness               Rectovaginal: Confirms               Anus:  normal sphincter tone, no lesions  Chaperone, Ina Homes, CMA, was present for exam.  Assessment/Plan: 1. Encntr for gyn exam (general) (routine) w/o abn findings - Pap smear obtained today - Mammogram 02/2022 - Colonoscopy 2022, follow up 7 years - Bone mineral density ordered - lab work will be done with Dr. Beryle Flock - vaccines reviewed/updated  2. Hypoestrogenism - DG BONE DENSITY (DXA); Future  3. Encounter for screening mammogram for malignant neoplasm of breast - MM 3D SCREENING MAMMOGRAM BILATERAL BREAST; Future  4. Postmenopausal   5.  Vulvar lesion - discussed biopsy.  Pt will return for this.

## 2022-12-29 NOTE — Assessment & Plan Note (Signed)
Well-managed on current medications. - Continue Plavix 75 mg daily - Continue baby aspirin

## 2022-12-30 ENCOUNTER — Other Ambulatory Visit: Payer: Self-pay | Admitting: Cardiovascular Disease

## 2022-12-31 DIAGNOSIS — M25811 Other specified joint disorders, right shoulder: Secondary | ICD-10-CM | POA: Diagnosis not present

## 2022-12-31 LAB — CYTOLOGY - PAP: Diagnosis: NEGATIVE

## 2023-01-01 DIAGNOSIS — I2510 Atherosclerotic heart disease of native coronary artery without angina pectoris: Secondary | ICD-10-CM | POA: Diagnosis not present

## 2023-01-01 DIAGNOSIS — E039 Hypothyroidism, unspecified: Secondary | ICD-10-CM | POA: Diagnosis not present

## 2023-01-01 DIAGNOSIS — E7800 Pure hypercholesterolemia, unspecified: Secondary | ICD-10-CM | POA: Diagnosis not present

## 2023-01-01 DIAGNOSIS — I10 Essential (primary) hypertension: Secondary | ICD-10-CM | POA: Diagnosis not present

## 2023-01-01 DIAGNOSIS — E559 Vitamin D deficiency, unspecified: Secondary | ICD-10-CM | POA: Diagnosis not present

## 2023-01-01 DIAGNOSIS — Z79899 Other long term (current) drug therapy: Secondary | ICD-10-CM | POA: Diagnosis not present

## 2023-01-02 LAB — LIPID PANEL
Chol/HDL Ratio: 2.5 {ratio} (ref 0.0–4.4)
Cholesterol, Total: 162 mg/dL (ref 100–199)
HDL: 65 mg/dL (ref >39)
LDL Chol Calc (NIH): 76 mg/dL (ref 0–99)
Triglycerides: 117 mg/dL (ref 0–149)
VLDL Cholesterol Cal: 21 mg/dL (ref 5–40)

## 2023-01-02 LAB — COMPREHENSIVE METABOLIC PANEL
ALT: 18 [IU]/L (ref 0–32)
AST: 26 [IU]/L (ref 0–40)
Albumin: 4.4 g/dL (ref 3.9–4.9)
Alkaline Phosphatase: 97 [IU]/L (ref 44–121)
BUN/Creatinine Ratio: 18 (ref 12–28)
BUN: 12 mg/dL (ref 8–27)
Bilirubin Total: 0.6 mg/dL (ref 0.0–1.2)
CO2: 22 mmol/L (ref 20–29)
Calcium: 9.4 mg/dL (ref 8.7–10.3)
Chloride: 105 mmol/L (ref 96–106)
Creatinine, Ser: 0.67 mg/dL (ref 0.57–1.00)
Globulin, Total: 2 g/dL (ref 1.5–4.5)
Glucose: 95 mg/dL (ref 70–99)
Potassium: 4 mmol/L (ref 3.5–5.2)
Sodium: 145 mmol/L — ABNORMAL HIGH (ref 134–144)
Total Protein: 6.4 g/dL (ref 6.0–8.5)
eGFR: 94 mL/min/{1.73_m2} (ref >59)

## 2023-01-02 LAB — VITAMIN D 25 HYDROXY (VIT D DEFICIENCY, FRACTURES): Vit D, 25-Hydroxy: 37.8 ng/mL (ref 30.0–100.0)

## 2023-01-02 LAB — TSH: TSH: 1.6 u[IU]/mL (ref 0.450–4.500)

## 2023-01-02 LAB — CBC WITH DIFFERENTIAL/PLATELET
Basophils Absolute: 0 10*3/uL (ref 0.0–0.2)
Basos: 1 %
EOS (ABSOLUTE): 0.2 10*3/uL (ref 0.0–0.4)
Eos: 3 %
Hematocrit: 42.6 % (ref 34.0–46.6)
Hemoglobin: 13.9 g/dL (ref 11.1–15.9)
Immature Grans (Abs): 0 10*3/uL (ref 0.0–0.1)
Immature Granulocytes: 0 %
Lymphocytes Absolute: 1.9 10*3/uL (ref 0.7–3.1)
Lymphs: 36 %
MCH: 31.1 pg (ref 26.6–33.0)
MCHC: 32.6 g/dL (ref 31.5–35.7)
MCV: 95 fL (ref 79–97)
Monocytes Absolute: 0.5 10*3/uL (ref 0.1–0.9)
Monocytes: 9 %
Neutrophils Absolute: 2.7 10*3/uL (ref 1.4–7.0)
Neutrophils: 51 %
Platelets: 233 10*3/uL (ref 150–450)
RBC: 4.47 x10E6/uL (ref 3.77–5.28)
RDW: 11.4 % — ABNORMAL LOW (ref 11.7–15.4)
WBC: 5.3 10*3/uL (ref 3.4–10.8)

## 2023-01-05 ENCOUNTER — Ambulatory Visit (INDEPENDENT_AMBULATORY_CARE_PROVIDER_SITE_OTHER): Payer: Medicare Other | Admitting: Obstetrics & Gynecology

## 2023-01-05 ENCOUNTER — Other Ambulatory Visit (HOSPITAL_COMMUNITY)
Admission: RE | Admit: 2023-01-05 | Discharge: 2023-01-05 | Disposition: A | Discharge status: Home / Self Care | Payer: Medicare Other | Source: Ambulatory Visit | Attending: Obstetrics & Gynecology | Admitting: Obstetrics & Gynecology

## 2023-01-05 ENCOUNTER — Telehealth: Payer: Self-pay | Admitting: Cardiovascular Disease

## 2023-01-05 DIAGNOSIS — N9089 Other specified noninflammatory disorders of vulva and perineum: Secondary | ICD-10-CM | POA: Insufficient documentation

## 2023-01-05 NOTE — Telephone Encounter (Signed)
   Pre-operative Risk Assessment    Patient Name: Olivia Dougherty  DOB: 1950/03/02 MRN: [id number]      Request for Surgical Clearance    Procedure:  Rt Reverse Shoulder Arthroplasty  Date of Surgery:  Clearance TBD                                 Surgeon:  Dr. Francena Hanly Surgeon's Group or Practice Name:  [facility] Phone number:  [phone] Fax number:  [phone]   Type of Clearance Requested:   - Medical    Type of Anesthesia:  General    Additional requests/questions:    SignedNarda Amber   01/05/2023, 10:49 AM

## 2023-01-06 ENCOUNTER — Telehealth: Payer: Self-pay | Admitting: Family Medicine

## 2023-01-06 NOTE — Telephone Encounter (Signed)
Preoperative clearance form received through fax from [facility] and placed in Dr. Senaida Lange box.

## 2023-01-06 NOTE — Telephone Encounter (Signed)
Dr. Kirke Corin, you saw this patient on 11/04/22 and mentioned getting an echo for evaluation of dyspnea, however echo was not ordered. He is now pending right reverse shoulder arthroplasty.   Please send your response to p cv div preop?  Thank you, Marcelino Duster

## 2023-01-07 ENCOUNTER — Encounter: Payer: Self-pay | Admitting: Family Medicine

## 2023-01-07 ENCOUNTER — Ambulatory Visit: Payer: Medicare Other | Admitting: Family Medicine

## 2023-01-07 VITALS — BP 138/70 | HR 78 | Resp 18 | Ht 62.0 in | Wt 217.0 lb

## 2023-01-07 DIAGNOSIS — Z01818 Encounter for other preprocedural examination: Secondary | ICD-10-CM | POA: Diagnosis not present

## 2023-01-07 LAB — SURGICAL PATHOLOGY

## 2023-01-07 NOTE — Patient Instructions (Addendum)
VISIT SUMMARY:  You came in today for a preoperative assessment for your upcoming right rotator cuff repair surgery. We discussed your shoulder pain, which has been worsening since July, and your overall health, including your cardiovascular history and current medications. Your recent lab results were all within normal limits.  YOUR PLAN:  -PREOPERATIVE ASSESSMENT FOR RIGHT ROTATOR CUFF REPAIR: You have been experiencing shoulder pain due to arthritis and a torn rotator cuff, and a reverse shoulder replacement has been recommended. This surgery aims to relieve pain and improve shoulder function. We will proceed with the preoperative assessment, complete the necessary paperwork, and send it to your surgeon. Your blood pressure will also be checked.  -CORONARY ARTERY DISEASE (CAD): You have a history of coronary artery disease with a stent placed in 2005. Your recent cardiology evaluation was satisfactory. You should continue taking Plavix 75 mg, metoprolol 25 mg, losartan 100 mg, and aspirin 81 mg daily as advised by your cardiologist.  -HYPERTENSION: Your blood pressure is well-controlled with your current medications, losartan 100 mg and metoprolol 25 mg. Your blood pressure today was 138/70 mmHg. Please continue your current medications and monitor your blood pressure regularly.  -HYPERLIPIDEMIA: Your cholesterol levels are being managed with Crestor 10 mg daily, and your recent LDL level was 76 mg/dL. Please continue taking Crestor 10 mg daily.  -HYPOTHYROIDISM: Your hypothyroidism is well-controlled with Synthroid 75 mcg daily, and your recent thyroid levels were normal. Please continue taking Synthroid 75 mcg daily.  -SLEEP APNEA: You have mild sleep apnea but do not use a CPAP machine. Due to this, your surgery will be performed in a hospital setting to ensure your safety.  -GENERAL HEALTH MAINTENANCE: You are generally active and able to perform daily activities. Please continue engaging in  regular physical activity, such as 15-20 minutes of walking most days. Monitor for any new symptoms like chest pain or shortness of breath.  Medications on Day of Surgery  - continue Zetia and Crestor, Aspirin, Metoprolol, Synthroid  - Hold Losartan day of surgery, ok to resume after surgery  - Hold Plavix minimum of three days before surgery (5 days if possible) - Take Aspirin 81mg  day of surgery       INSTRUCTIONS:  Please follow up with your primary care physician after your surgery and have an annual follow-up with your cardiologist.

## 2023-01-07 NOTE — Telephone Encounter (Signed)
Is seeing Dr Roxan Hockey today for pre-op appt

## 2023-01-07 NOTE — Telephone Encounter (Signed)
She is at low risk from a cardiac standpoint.  No need for an echo or other cardiac testing.  I corrected my note.  Thanks.

## 2023-01-07 NOTE — Progress Notes (Signed)
Established patient visit   Patient: Olivia Dougherty   DOB: [birth year]   72 y.o. Female  MRN: [id number] Visit Date: 01/07/2023  Today's healthcare provider: Ronnald Ramp, MD   Chief Complaint  Patient presents with   Pre-op Exam    Rotator cuff repair right shoulder, date not yet set   Subjective     HPI     Pre-op Exam    Additional comments: Rotator cuff repair right shoulder, date not yet set      Last edited by Ashok Cordia, CMA on 01/07/2023  3:36 PM.       Discussed the use of AI scribe software for clinical note transcription with the patient, who gave verbal consent to proceed.  History of Present Illness   The patient, a 72 year old individual with a history of anxiety, arthritis, basal cell carcinoma, coronary artery disease with stent placement, hyperlipidemia, hypertension, hypothyroidism, kidney stones, and sleep apnea, presents for a preoperative assessment for a scheduled right rotator cuff repair. The patient reports experiencing shoulder pain since late July, which has gradually worsened over time. The pain is attributed to arthritis and a torn rotator cuff, for which a reverse shoulder replacement has been recommended.  The patient's cardiovascular history is significant, with a stent placement in the left anterior descending artery. The patient has been under the care of a cardiologist, with the most recent evaluation in mid-October showing no significant issues. The patient is on Plavix, which she has been advised to continue indefinitely due to the type of stent placed in 2005.  The patient reports being generally active, able to walk around for about 30 minutes, perform household chores, and carry groceries. However, she notes a decrease in her activity level due to arthritis in the knees and hips. The patient denies experiencing any chest pain or shortness of breath. The patient's most recent lab results, including complete  metabolic panel, lipid profile, complete blood count, and thyroid levels, were within normal limits.  The patient also reports a history of a torn retina, which was surgically repaired. She has been diagnosed with a mild case of sleep apnea but does not use a CPAP machine. The patient denies any history of smoking. The patient's medications include Plavix 75mg , Synthroid , losartan 100mg , metoprolol 25mg , Crestor 10mg , and aspirin 81mg .      Pt is a 72 y.o. female who is here for preoperative clearance for right shoulder arthroplasty   1) High Risk Cardiac Conditions  1) Recent MI - No.  2) Decompensated Heart Failure - No.  3) Unstable angina - No.  4) Symptomatic arrhythmia - No.  5) Sx Valvular Disease - No.  2) Intermediate Risk Factors - DM, CKD, CVA, CHF, CAD - Yes.    2) Functional Status - > 4 mets (Walk, run, climb stairs) Yes.    [facility] Activity Status Index: 42.7  3) Surgery Specific Risk - High  (Emergency, Vascular, Intra-abdominal, Extensive ops)          Intermediate (Carotid, Head and Neck, Orthopaedic )          Low (Endoscopic, Cataract, Breast )  4) Further Noninvasive evaluation -   1) EKG - No.   1) Hx of CVA, CAD, DM, CKD  2) Echo - No. Last completed in 2022, EF 60-65%, no valve disease determined    1) Worsening dyspnea   3) Stress Testing - Active Cardiac Disease - No.  5) Need for medical therapy -  Beta Blocker, Statins indicated ? Yes.       Past Medical History:  Diagnosis Date   Anxiety    Arthritis    lower back   Basal cell carcinoma 11/2011   face, left leg-using chemo cream on temple area   Coronary artery disease 2005   LAD PCI with Cypher drug-eluting stent placement 3.0 x 13 mm with balloon angioplasty of diagonal.  Cardiac catheterization in 2010 showed patent stent with minimal restenosis.   Diverticulitis 12/11/2020   GERD (gastroesophageal reflux disease)    Heart disease    Heart problem    98% blockage   Hypercholesteremia     Hypertension    Hypothyroidism    Kidney stone    Osteoarthritis    PONV (postoperative nausea and vomiting)    after 1 procedure approx 10 yrs ago   Sleep apnea    mild, could not tol cpap   Squamous cell carcinoma of skin    leg    Medications: Outpatient Medications Prior to Visit  Medication Sig   aspirin 81 MG tablet Take 81 mg by mouth daily.   augmented betamethasone dipropionate (DIPROLENE-AF) 0.05 % cream Apply 1 application topically as needed. For red bumps   Cholecalciferol (VITAMIN D3) 50 MCG (2000 UT) CAPS Take by mouth daily.   clopidogrel (PLAVIX) 75 MG tablet TAKE 1 TABLET BY MOUTH DAILY WITH BREAKFAST   ezetimibe (ZETIA) 10 MG tablet TAKE 1 TABLET BY MOUTH DAILY   gabapentin (NEURONTIN) 100 MG capsule Take 100 mg by mouth 2 (two) times daily.   levothyroxine (SYNTHROID) 75 MCG tablet TAKE 1 TABLET BY MOUTH DAILY BEFORE BREAKFAST   losartan (COZAAR) 100 MG tablet TAKE 1 TABLET BY MOUTH DAILY   metoprolol tartrate (LOPRESSOR) 25 MG tablet TAKE 1 TABLET BY MOUTH TWICE A DAY   Multiple Vitamins-Minerals (MULTIVITAMIN PO) Take by mouth daily.   rosuvastatin (CRESTOR) 10 MG tablet TAKE 1 TABLET BY MOUTH DAILY   No facility-administered medications prior to visit.    Review of Systems  Last CBC Lab Results  Component Value Date   WBC 5.3 01/01/2023   HGB 13.9 01/01/2023   HCT 42.6 01/01/2023   MCV 95 01/01/2023   MCH 31.1 01/01/2023   RDW 11.4 (L) 01/01/2023   PLT 233 01/01/2023   Last metabolic panel Lab Results  Component Value Date   GLUCOSE 95 01/01/2023   NA 145 (H) 01/01/2023   K 4.0 01/01/2023   CL 105 01/01/2023   CO2 22 01/01/2023   BUN 12 01/01/2023   CREATININE 0.67 01/01/2023   EGFR 94 01/01/2023   CALCIUM 9.4 01/01/2023   PROT 6.4 01/01/2023   ALBUMIN 4.4 01/01/2023   LABGLOB 2.0 01/01/2023   AGRATIO 1.8 12/23/2021   BILITOT 0.6 01/01/2023   ALKPHOS 97 01/01/2023   AST 26 01/01/2023   ALT 18 01/01/2023   ANIONGAP 9 06/01/2020    Last lipids Lab Results  Component Value Date   CHOL 162 01/01/2023   HDL 65 01/01/2023   LDLCALC 76 01/01/2023   TRIG 117 01/01/2023   CHOLHDL 2.5 01/01/2023   Last hemoglobin A1c No results found for: "HGBA1C" Last thyroid functions Lab Results  Component Value Date   TSH 1.600 01/01/2023        Objective    BP 138/70   Pulse 78   Resp 18   Ht 5\' 2"  (1.575 m)   Wt 217 lb (98.4 kg)   LMP 01/28/2007 (Approximate)   SpO2 96%  BMI 39.69 kg/m   BP Readings from Last 3 Encounters:  01/07/23 138/70  12/29/22 130/75  12/29/22 (!) 112/52   Wt Readings from Last 3 Encounters:  01/07/23 217 lb (98.4 kg)  12/29/22 216 lb 8 oz (98.2 kg)  12/29/22 215 lb 6.4 oz (97.7 kg)        Physical Exam  General: Alert, no acute distress Cardio: Normal S1 and S2, RRR, no r/m/g Pulm: CTAB, normal work of breathing Extremities: No peripheral edema.    No results found for any visits on 01/07/23.  Assessment & Plan     Problem List Items Addressed This Visit   None Visit Diagnoses     Encounter for preoperative assessment    -  Primary           Preoperative Assessment for Right Rotator Cuff Repair 72 year old presenting for preoperative assessment. History of shoulder pain since July, with gradual onset. Orthopedic surgeon recommended reverse shoulder replacement due to severity of tear and arthritis. Patient eager to proceed with surgery. Risks include low risk of serious complications (0.1% to 2.6%) and higher risk of discharge to a nursing or rehab facility. Benefits include pain relief and improved shoulder function. Alternatives include conservative management, which may not be effective due to severity of tear. Conditions increasing risk includes hx of OSA(unable to tolerate CPAP), CAD s/p re-stenosed stent on life long DAPT per cardiology note from 11/04/2022- personally reviewed external note from cardiology evaluation  NSQIP: 0.2% of cardiac event  2.2%  serious complication  2.6% of any complication - recommend proceeding to schedule arthroplasty under general anesthesia  - Complete necessary paperwork and send to the surgeon - Check blood pressure - recommended holding Plavix for min of 3 days, ideally 5 days prior to surgery  -recommended patient contact cardiology office to establish plan for updated echo   Coronary Artery Disease (CAD) CAD with stent placed in 2005 LAD. On Plavix 75 mg daily, advised to continue lifelong due to type of stent. Reviewed Recent cardiology evaluation note from November 04 2022 with recommendations for lifelong dual antiplatelet therapy as tolerated, continued crestor and zetia and continued blood pressure regimen  Last EKG normal.  - Continue Plavix 75 mg daily - Continue metoprolol 25 mg daily - Continue losartan 100 mg daily - Continue aspirin 81 mg daily  Hypertension Well-controlled hypertension. Current medications include losartan 100 mg and metoprolol 25 mg. Blood pressure today 138/70 mmHg. Patient monitors blood pressure regularly at home. - Continue current antihypertensive regimen - Monitor blood pressure regularly  Hyperlipidemia Managed with Crestor 10 mg. Recent LDL 76 mg/dL. - Continue Crestor 10 mg daily  Hypothyroidism Well-controlled hypothyroidism. Current medication Synthroid 75 mcg. Recent thyroid levels within normal ranges. - Continue Synthroid 75 mcg daily  Sleep Apnea Mild sleep apnea diagnosed over 20 years ago. Does not use CPAP machine. Surgery to be performed in hospital setting due to this history. - Ensure surgery is scheduled in a hospital setting  General Health Maintenance Generally active, able to perform daily activities, engages in moderate physical activity. No recent chest pain, shortness of breath, or heart failure symptoms. Last echocardiogram in 2022 showed normal heart function with EF of 60-65%. - Encourage regular physical activity (15-20 minutes of  walking most days) - Monitor for any new symptoms of chest pain or shortness of breath      I have independently evaluated patient.  Olivia Dougherty is a 72 y.o. female who is low risk for a intermediate  risk surgery.  There are no modifiable risk factors .   No follow-ups on file.         Ronnald Ramp, MD  [facility] [phone] (phone) [phone] (fax)  [facility]

## 2023-01-07 NOTE — Progress Notes (Signed)
GYNECOLOGY  VISIT  CC:   vulvar biopsy  HPI: 72 y.o. [id number] Married White or Caucasian female here for vulvar biopsy due to pigmentation on left inner labia majora that is darker in appearance with irregular boarders.  She is aware this could just be melanosis but I'm not completely sure so biopsy recommended.  She is here for this today.  Questions answered.  Consent obtained.  Post care reviewed.   ALLERGIES: Atorvastatin, Codeine, Ace inhibitors, Enalapril, Penicillins, Sulfa antibiotics, and Adhesive [tape]  SH:  married, non smoker  Review of Systems  Constitutional: Negative.     PHYSICAL EXAMINATION:    LMP 01/28/2007 (Approximate)     Physical Exam Constitutional:      Appearance: Normal appearance.  Genitourinary:   Neurological:     Mental Status: She is alert.    Procedure:  Area cleansed with Betadine.  Sterile technique used throughout procedure.  Skin anesthestized with Lidocaine 1% plain; 1mL.  4 punch biopsy used to obtain specimen.  Biopsy grasped with pick-ups and excised with scissors.  Adequate hemostasis obtained with silver nitrate sticks.  Dressing was not applied.  Pt tolerated procedure well   Chaperone, Raechel Ache, CMA, was present for exam.  Assessment/Plan: 1. Vulvar lesion - biopsy obtained today without difficulty.  Pathology results will be called to pt. - Surgical pathology( [place]/ [facility])

## 2023-01-08 ENCOUNTER — Other Ambulatory Visit: Payer: Self-pay | Admitting: Cardiovascular Disease

## 2023-01-08 NOTE — Telephone Encounter (Signed)
   Patient Name: Olivia Dougherty  DOB: [birth year] MRN: [id number]  Primary Cardiologist: Lorine Bears, MD  Chart reviewed as part of pre-operative protocol coverage. Given past medical history and time since last visit, based on ACC/AHA guidelines, Olivia Dougherty is at acceptable risk for the planned procedure without further cardiovascular testing. Per Dr. Kirke Corin "She is at low risk from a cardiac standpoint. No need for an echo or other cardiac testing "  The patient was advised that if she develops new symptoms prior to surgery to contact our office to arrange for a follow-up visit, and she verbalized understanding.  I will route this recommendation to the requesting party via Epic fax function and remove from pre-op pool.  Please call with questions.  Joni Reining, NP 01/08/2023, 7:29 AM

## 2023-01-08 NOTE — Progress Notes (Addendum)
Anesthesia Review:  PCP: Vanita Ingles [facility] preop eval on 01/07/23 clearance dated 01/12/23 in AMB correspondence.   Cardiologist : DR Kirke Corin LOV 11/04/22   Zonia Kief preop on 01/08/23 telephone visit  Cehst xray:  EKG : 11/04/22  Echo : 2022  Stress test: Cardiac Cath :  Activity level: can do a flight of stairs without difficutoy  Sleep Study/ CPAP : mild sleep apnea no cpap  Fasting Blood Sugar :      / Checks Blood Sugar -- times a day:   Blood Thinner/ Instructions /Last Dose: ASA / Instructions/ Last Dose :    Plavix- Pt has not yet received instructions in regards.  PT was given both written and verbal instructions to call DR Supple and/or Cardiology in regards to preop instrucitons for Plavix and to notify them today.  PT voiced understanding.     01/01/23- cbc/diff and cmp   NO orders in epic on am of preop of 01/13/23.  Called and requested orders.  No orders at preop appt.

## 2023-01-09 ENCOUNTER — Other Ambulatory Visit (HOSPITAL_BASED_OUTPATIENT_CLINIC_OR_DEPARTMENT_OTHER): Payer: Medicare Other

## 2023-01-09 ENCOUNTER — Inpatient Hospital Stay (HOSPITAL_BASED_OUTPATIENT_CLINIC_OR_DEPARTMENT_OTHER): Admission: RE | Admit: 2023-01-09 | Payer: Medicare Other | Source: Ambulatory Visit | Admitting: Radiology

## 2023-01-09 NOTE — Progress Notes (Signed)
Sent message, via epic in basket, requesting orders in epic from surgeon.  

## 2023-01-12 ENCOUNTER — Ambulatory Visit: Payer: Medicare Other | Admitting: Family Medicine

## 2023-01-12 NOTE — Patient Instructions (Addendum)
SURGICAL WAITING ROOM VISITATION  Patients having surgery or a procedure may have no more than 2 support people in the waiting area - these visitors may rotate.    Children under the age of 12 must have an adult with them who is not the patient.  Due to an increase in RSV and influenza rates and associated hospitalizations, children ages 44 and under may not visit patients in [facility] hospitals.  If the patient needs to stay at the hospital during part of their recovery, the visitor guidelines for inpatient rooms apply. Pre-op nurse will coordinate an appropriate time for 1 support person to accompany patient in pre-op.  This support person may not rotate.    Please refer to the [facility] website for the visitor guidelines for Inpatients (after your surgery is over and you are in a regular room).       Your procedure is scheduled on:  01/22/2023    Report to [facility] Main Entrance    Report to admitting at   1000AM   Call this number if you have problems the morning of surgery [phone]   Do not eat food :After Midnight.   After Midnight you may have the following liquids until __ 0930____ AM DAY OF SURGERY  Water Non-Citrus Juices (without pulp, NO RED-Apple, White grape, White cranberry) Black Coffee (NO MILK/CREAM OR CREAMERS, sugar ok)  Clear Tea (NO MILK/CREAM OR CREAMERS, sugar ok) regular and decaf                             Plain Jell-O (NO RED)                                           Fruit ices (not with fruit pulp, NO RED)                                     Popsicles (NO RED)                                                               Sports drinks like Gatorade (NO RED)                           If you have questions, please contact your surgeon's office.      Oral Hygiene is also important to reduce your risk of infection.                                    Remember - BRUSH YOUR TEETH THE MORNING OF SURGERY WITH YOUR REGULAR  TOOTHPASTE  DENTURES WILL BE REMOVED PRIOR TO SURGERY PLEASE DO NOT APPLY "Poly grip" OR ADHESIVES!!!   Do NOT smoke after Midnight   Stop all vitamins and herbal supplements 7 days before surgery.   Take these medicines the morning of surgery with A SIP OF WATER:   gabapentin,, synthroid, metoprolol   DO NOT TAKE ANY ORAL  DIABETIC MEDICATIONS DAY OF YOUR SURGERY  Bring CPAP mask and tubing day of surgery.                              You may not have any metal on your body including hair pins, jewelry, and body piercing             Do not wear make-up, lotions, powders, perfumes/cologne, or deodorant  Do not wear nail polish including gel and S&S, artificial/acrylic nails, or any other type of covering on natural nails including finger and toenails. If you have artificial nails, gel coating, etc. that needs to be removed by a nail salon please have this removed prior to surgery or surgery may need to be canceled/ delayed if the surgeon/ anesthesia feels like they are unable to be safely monitored.   Do not shave  48 hours prior to surgery.               Men may shave face and neck.   Do not bring valuables to the hospital. [facility] IS NOT             RESPONSIBLE   FOR VALUABLES.   Contacts, glasses, dentures or bridgework may not be worn into surgery.   Bring small overnight bag day of surgery.   DO NOT BRING YOUR HOME MEDICATIONS TO THE HOSPITAL. PHARMACY WILL DISPENSE MEDICATIONS LISTED ON YOUR MEDICATION LIST TO YOU DURING YOUR ADMISSION IN THE HOSPITAL!    Patients discharged on the day of surgery will not be allowed to drive home.  Someone NEEDS to stay with you for the first 24 hours after anesthesia.   Special Instructions: Bring a copy of your healthcare power of attorney and living will documents the day of surgery if you haven't scanned them before.              Please read over the following fact sheets you were given: IF YOU HAVE QUESTIONS ABOUT YOUR PRE-OP  INSTRUCTIONS PLEASE CALL [phone]   If you received a COVID test during your pre-op visit  it is requested that you wear a mask when out in public, stay away from anyone that may not be feeling well and notify your surgeon if you develop symptoms. If you test positive for Covid or have been in contact with anyone that has tested positive in the last 10 days please notify you surgeon.      Pre-operative 5 CHG Bath Instructions   You can play a key role in reducing the risk of infection after surgery. Your skin needs to be as free of germs as possible. You can reduce the number of germs on your skin by washing with CHG (chlorhexidine gluconate) soap before surgery. CHG is an antiseptic soap that kills germs and continues to kill germs even after washing.   DO NOT use if you have an allergy to chlorhexidine/CHG or antibacterial soaps. If your skin becomes reddened or irritated, stop using the CHG and notify one of our RNs at [phone].   Please shower with the CHG soap starting 4 days before surgery using the following schedule:     Please keep in mind the following:  DO NOT shave, including legs and underarms, starting the day of your first shower.   You may shave your face at any point before/day of surgery.  Place clean sheets on your bed the day you start using CHG soap. Use  a clean washcloth (not used since being washed) for each shower. DO NOT sleep with pets once you start using the CHG.   CHG Shower Instructions:  If you choose to wash your hair and private area, wash first with your normal shampoo/soap.  After you use shampoo/soap, rinse your hair and body thoroughly to remove shampoo/soap residue.  Turn the water OFF and apply about 3 tablespoons (45 ml) of CHG soap to a CLEAN washcloth.  Apply CHG soap ONLY FROM YOUR NECK DOWN TO YOUR TOES (washing for 3-5 minutes)  DO NOT use CHG soap on face, private areas, open wounds, or sores.  Pay special attention to the area where  your surgery is being performed.  If you are having back surgery, having someone wash your back for you may be helpful. Wait 2 minutes after CHG soap is applied, then you may rinse off the CHG soap.  Pat dry with a clean towel  Put on clean clothes/pajamas   If you choose to wear lotion, please use ONLY the CHG-compatible lotions on the back of this paper.     Additional instructions for the day of surgery: DO NOT APPLY any lotions, deodorants, cologne, or perfumes.   Put on clean/comfortable clothes.  Brush your teeth.  Ask your nurse before applying any prescription medications to the skin.      CHG Compatible Lotions   Aveeno Moisturizing lotion  Cetaphil Moisturizing Cream  Cetaphil Moisturizing Lotion  Clairol Herbal Essence Moisturizing Lotion, Dry Skin  Clairol Herbal Essence Moisturizing Lotion, Extra Dry Skin  Clairol Herbal Essence Moisturizing Lotion, Normal Skin  Curel Age Defying Therapeutic Moisturizing Lotion with Alpha Hydroxy  Curel Extreme Care Body Lotion  Curel Soothing Hands Moisturizing Hand Lotion  Curel Therapeutic Moisturizing Cream, Fragrance-Free  Curel Therapeutic Moisturizing Lotion, Fragrance-Free  Curel Therapeutic Moisturizing Lotion, Original Formula  Eucerin Daily Replenishing Lotion  Eucerin Dry Skin Therapy Plus Alpha Hydroxy Crme  Eucerin Dry Skin Therapy Plus Alpha Hydroxy Lotion  Eucerin Original Crme  Eucerin Original Lotion  Eucerin Plus Crme Eucerin Plus Lotion  Eucerin TriLipid Replenishing Lotion  Keri Anti-Bacterial Hand Lotion  Keri Deep Conditioning Original Lotion Dry Skin Formula Softly Scented  Keri Deep Conditioning Original Lotion, Fragrance Free Sensitive Skin Formula  Keri Lotion Fast Absorbing Fragrance Free Sensitive Skin Formula  Keri Lotion Fast Absorbing Softly Scented Dry Skin Formula  Keri Original Lotion  Keri Skin Renewal Lotion Keri Silky Smooth Lotion  Keri Silky Smooth Sensitive Skin Lotion  Nivea Body  Creamy Conditioning Oil  Nivea Body Extra Enriched Teacher, adult education Moisturizing Lotion Nivea Crme  Nivea Skin Firming Lotion  NutraDerm 30 Skin Lotion  NutraDerm Skin Lotion  NutraDerm Therapeutic Skin Cream  NutraDerm Therapeutic Skin Lotion  ProShield Protective Hand Cream  Provon moisturizing lotion    ==================================================================

## 2023-01-13 ENCOUNTER — Other Ambulatory Visit: Payer: Self-pay

## 2023-01-13 ENCOUNTER — Encounter (HOSPITAL_COMMUNITY)
Admission: RE | Admit: 2023-01-13 | Discharge: 2023-01-13 | Disposition: A | Discharge status: Home / Self Care | Payer: Medicare Other | Source: Ambulatory Visit | Attending: Orthopedic Surgery | Admitting: Orthopedic Surgery

## 2023-01-13 ENCOUNTER — Encounter (HOSPITAL_COMMUNITY): Payer: Self-pay

## 2023-01-13 VITALS — BP 153/75 | HR 67 | Temp 98.7°F | Resp 16 | Ht 62.0 in | Wt 215.0 lb

## 2023-01-13 DIAGNOSIS — M75101 Unspecified rotator cuff tear or rupture of right shoulder, not specified as traumatic: Secondary | ICD-10-CM | POA: Insufficient documentation

## 2023-01-13 DIAGNOSIS — I10 Essential (primary) hypertension: Secondary | ICD-10-CM | POA: Diagnosis not present

## 2023-01-13 DIAGNOSIS — Z01818 Encounter for other preprocedural examination: Secondary | ICD-10-CM

## 2023-01-13 DIAGNOSIS — Z01812 Encounter for preprocedural laboratory examination: Secondary | ICD-10-CM | POA: Diagnosis not present

## 2023-01-13 DIAGNOSIS — I2510 Atherosclerotic heart disease of native coronary artery without angina pectoris: Secondary | ICD-10-CM | POA: Diagnosis not present

## 2023-01-13 HISTORY — DX: Pneumonia, unspecified organism: J18.9

## 2023-01-13 LAB — SURGICAL PCR SCREEN
MRSA, PCR: NEGATIVE
Staphylococcus aureus: NEGATIVE

## 2023-01-14 NOTE — Progress Notes (Addendum)
Anesthesia Chart Review   Case: [id number] Date/Time: 01/22/23 1215   Procedure: REVERSE SHOULDER ARTHROPLASTY (Right: Shoulder) - 120 min   Anesthesia type: General   Pre-op diagnosis: Right Shoulder Rotator Cuff Tear Artropathy   Location: [facility] ROOM 06 / [facility]   Surgeons: Francena Hanly, MD       DISCUSSION:72 y.o. never smoker with h/o PONV, HTN, sleep apnea, CAD s/p stent, right shoulder rotator cuff tear scheduled for above procedure 01/22/2023 with Dr. Francena Hanly.   Per cardiology preoperative evaluation 01/08/2023, "Chart reviewed as part of pre-operative protocol coverage. Given past medical history and time since last visit, based on ACC/AHA guidelines, Olivia Dougherty is at acceptable risk for the planned procedure without further cardiovascular testing. Per Dr. Kirke Corin "She is at low risk from a cardiac standpoint. No need for an echo or other cardiac testing"  Per clearance pt advised to hold Plavix 5 days prior to surgery.  VS: BP (!) 153/75   Pulse 67   Temp 37.1 C (Oral)   Resp 16   Ht 5\' 2"  (1.575 m)   Wt 97.5 kg   LMP 01/28/2007 (Approximate)   SpO2 98%   BMI 39.32 kg/m   PROVIDERS: Erasmo Downer, MD is PCP   Primary Cardiologist: Lorine Bears, MD  LABS: Labs reviewed: Acceptable for surgery. (all labs ordered are listed, but only abnormal results are displayed)  Labs Reviewed  SURGICAL PCR SCREEN     IMAGES:   EKG:   CV: Echo 10/16/2020 1. Left ventricular ejection fraction, by estimation, is 60 to 65%. The  left ventricle has normal function. The left ventricle has no regional  wall motion abnormalities. Left ventricular diastolic parameters are  indeterminate.   2. Right ventricular systolic function is normal. The right ventricular  size is normal. Tricuspid regurgitation signal is inadequate for assessing  PA pressure.   3. The mitral valve is grossly normal. Trivial mitral valve  regurgitation. No evidence of mitral  stenosis.   4. The aortic valve was not well visualized. Aortic valve regurgitation  is not visualized. No aortic stenosis is present.   5. The inferior vena cava is normal in size with greater than 50%  respiratory variability, suggesting right atrial pressure of 3 mmHg.  Past Medical History:  Diagnosis Date   Anxiety    Arthritis    lower back   Basal cell carcinoma 11/2011   face, left leg-using chemo cream on temple area   Coronary artery disease 2005   LAD PCI with Cypher drug-eluting stent placement 3.0 x 13 mm with balloon angioplasty of diagonal.  Cardiac catheterization in 2010 showed patent stent with minimal restenosis.   Diverticulitis 12/11/2020   GERD (gastroesophageal reflux disease)    Heart disease    Heart problem    98% blockage   Hypercholesteremia    Hypertension    Hypothyroidism    Osteoarthritis    Pneumonia    as a infant   PONV (postoperative nausea and vomiting)    after 1 procedure approx 10 yrs ago   Sleep apnea    mild, could not tol cpap   Squamous cell carcinoma of skin    leg    Past Surgical History:  Procedure Laterality Date   ANGIOPLASTY  2005   CAD   98% blockage   CARDIAC CATHETERIZATION  02/2008   CATARACT EXTRACTION Right    CATARACT EXTRACTION W/PHACO Right 08/31/2019   Procedure: CATARACT EXTRACTION PHACO AND INTRAOCULAR LENS PLACEMENT (IOC)  RIGHT;  Surgeon: Nevada Crane, MD;  Location: [facility];  Service: Ophthalmology;  Laterality: Right;  4.41 0:30.7   CATARACT EXTRACTION W/PHACO Left 09/26/2019   Procedure: CATARACT EXTRACTION PHACO AND INTRAOCULAR LENS PLACEMENT (IOC) LEFT 1.60 00:20.3;  Surgeon: Nevada Crane, MD;  Location: [facility];  Service: Ophthalmology;  Laterality: Left;   COLONOSCOPY WITH PROPOFOL N/A 12/29/2014   Procedure: COLONOSCOPY WITH PROPOFOL;  Surgeon: Midge Minium, MD;  Location: [facility];  Service: Endoscopy;  Laterality: N/A;   COLONOSCOPY WITH PROPOFOL N/A  12/31/2017   Procedure: COLONOSCOPY WITH PROPOFOL;  Surgeon: Midge Minium, MD;  Location: [facility];  Service: Endoscopy;  Laterality: N/A;  sleep apnea   COLONOSCOPY WITH PROPOFOL N/A 12/31/2020   Procedure: COLONOSCOPY WITH PROPOFOL;  Surgeon: Midge Minium, MD;  Location: [facility];  Service: Endoscopy;  Laterality: N/A;   CORONARY ANGIOPLASTY  2005   LAD   EYE SURGERY Right    right eye torn retina surgery   HYSTEROSCOPY  11/2001   resection of benign leiomyoma   KNEE ARTHROSCOPY Right 08/16/2014   Procedure: ARTHROSCOPY KNEE, partial lateral menisectomy and chondroplasty;  Surgeon: Donato Heinz, MD;  Location: [facility];  Service: Orthopedics;  Laterality: Right;   POLYPECTOMY  12/29/2014   Procedure: POLYPECTOMY;  Surgeon: Midge Minium, MD;  Location: [facility];  Service: Endoscopy;;   SKIN CANCER EXCISION Right 02/2018   Right leg - Squamous    THYROID CYST EXCISION Left 10/2002   left lobe thyroid excision   THYROIDECTOMY, PARTIAL Left    TUBAL LIGATION  1980   BTL   UMBILICAL HERNIA REPAIR  1980   vulvar cancer  1993   laser    MEDICATIONS:  aspirin 81 MG tablet   augmented betamethasone dipropionate (DIPROLENE-AF) 0.05 % cream   Cholecalciferol (VITAMIN D3) 50 MCG (2000 UT) CAPS   clopidogrel (PLAVIX) 75 MG tablet   ezetimibe (ZETIA) 10 MG tablet   gabapentin (NEURONTIN) 100 MG capsule   levothyroxine (SYNTHROID) 75 MCG tablet   losartan (COZAAR) 100 MG tablet   metoprolol tartrate (LOPRESSOR) 25 MG tablet   Multiple Vitamins-Minerals (MULTIVITAMIN PO)   Propylene Glycol, PF, (SYSTANE COMPLETE PF) 0.6 % SOLN   rosuvastatin (CRESTOR) 10 MG tablet   No current facility-administered medications for this encounter.   Jodell Cipro Ward, PA-C [facility] [phone]

## 2023-01-22 ENCOUNTER — Ambulatory Visit (HOSPITAL_COMMUNITY): Payer: Medicare Other | Admitting: Anesthesiology

## 2023-01-22 ENCOUNTER — Encounter (HOSPITAL_COMMUNITY): Payer: Self-pay | Admitting: Orthopedic Surgery

## 2023-01-22 ENCOUNTER — Other Ambulatory Visit: Payer: Self-pay

## 2023-01-22 ENCOUNTER — Encounter (HOSPITAL_COMMUNITY)
Admission: RE | Disposition: A | Discharge status: Home / Self Care | Payer: Self-pay | Source: Ambulatory Visit | Attending: Orthopedic Surgery

## 2023-01-22 ENCOUNTER — Ambulatory Visit (HOSPITAL_COMMUNITY): Payer: Medicare Other | Admitting: Physician Assistant

## 2023-01-22 ENCOUNTER — Ambulatory Visit (HOSPITAL_COMMUNITY)
Admission: RE | Admit: 2023-01-22 | Discharge: 2023-01-22 | Disposition: A | Discharge status: Home / Self Care | Payer: Medicare Other | Source: Ambulatory Visit | Attending: Orthopedic Surgery | Admitting: Orthopedic Surgery

## 2023-01-22 DIAGNOSIS — M19011 Primary osteoarthritis, right shoulder: Secondary | ICD-10-CM

## 2023-01-22 DIAGNOSIS — I2510 Atherosclerotic heart disease of native coronary artery without angina pectoris: Secondary | ICD-10-CM | POA: Insufficient documentation

## 2023-01-22 DIAGNOSIS — E039 Hypothyroidism, unspecified: Secondary | ICD-10-CM

## 2023-01-22 DIAGNOSIS — I10 Essential (primary) hypertension: Secondary | ICD-10-CM | POA: Insufficient documentation

## 2023-01-22 DIAGNOSIS — M12811 Other specific arthropathies, not elsewhere classified, right shoulder: Secondary | ICD-10-CM | POA: Diagnosis not present

## 2023-01-22 DIAGNOSIS — Z955 Presence of coronary angioplasty implant and graft: Secondary | ICD-10-CM | POA: Diagnosis not present

## 2023-01-22 DIAGNOSIS — G8918 Other acute postprocedural pain: Secondary | ICD-10-CM | POA: Diagnosis not present

## 2023-01-22 DIAGNOSIS — M25711 Osteophyte, right shoulder: Secondary | ICD-10-CM | POA: Diagnosis not present

## 2023-01-22 DIAGNOSIS — M75101 Unspecified rotator cuff tear or rupture of right shoulder, not specified as traumatic: Secondary | ICD-10-CM | POA: Insufficient documentation

## 2023-01-22 DIAGNOSIS — Z01818 Encounter for other preprocedural examination: Secondary | ICD-10-CM

## 2023-01-22 HISTORY — PX: REVERSE SHOULDER ARTHROPLASTY: SHX5054

## 2023-01-22 SURGERY — ARTHROPLASTY, SHOULDER, TOTAL, REVERSE
Anesthesia: Regional | Site: Shoulder | Laterality: Right

## 2023-01-22 MED ORDER — FENTANYL CITRATE (PF) 100 MCG/2ML IJ SOLN
INTRAMUSCULAR | Status: AC
  Filled 2023-01-22: qty 2

## 2023-01-22 MED ORDER — PHENYLEPHRINE HCL (PRESSORS) 10 MG/ML IV SOLN
INTRAVENOUS | Status: DC | PRN
  Administered 2023-01-22 (×2): 120 ug via INTRAVENOUS

## 2023-01-22 MED ORDER — PHENYLEPHRINE HCL-NACL 20-0.9 MG/250ML-% IV SOLN
INTRAVENOUS | Status: DC | PRN
Start: 2023-01-22 — End: 2023-01-22
  Administered 2023-01-22: 30 ug/min via INTRAVENOUS

## 2023-01-22 MED ORDER — CHLORHEXIDINE GLUCONATE 0.12 % MT SOLN
15.0000 mL | Freq: Once | OROMUCOSAL | Status: AC
  Administered 2023-01-22: 15 mL via OROMUCOSAL

## 2023-01-22 MED ORDER — DEXAMETHASONE SODIUM PHOSPHATE 10 MG/ML IJ SOLN
INTRAMUSCULAR | Status: DC | PRN
  Administered 2023-01-22: 8 mg via INTRAVENOUS

## 2023-01-22 MED ORDER — FENTANYL CITRATE PF 50 MCG/ML IJ SOSY: 25.0000 ug | PREFILLED_SYRINGE | INTRAMUSCULAR | Status: DC | PRN

## 2023-01-22 MED ORDER — ROCURONIUM BROMIDE 10 MG/ML (PF) SYRINGE
PREFILLED_SYRINGE | INTRAVENOUS | Status: AC
  Filled 2023-01-22: qty 10

## 2023-01-22 MED ORDER — LIDOCAINE HCL (PF) 2 % IJ SOLN
INTRAMUSCULAR | Status: AC
  Filled 2023-01-22: qty 5

## 2023-01-22 MED ORDER — ROCURONIUM BROMIDE 100 MG/10ML IV SOLN
INTRAVENOUS | Status: DC | PRN
  Administered 2023-01-22: 60 mg via INTRAVENOUS

## 2023-01-22 MED ORDER — ONDANSETRON HCL 4 MG/2ML IJ SOLN
INTRAMUSCULAR | Status: DC | PRN
  Administered 2023-01-22: 4 mg via INTRAVENOUS

## 2023-01-22 MED ORDER — VANCOMYCIN HCL 1000 MG IV SOLR
INTRAVENOUS | Status: AC
  Filled 2023-01-22: qty 20

## 2023-01-22 MED ORDER — OXYCODONE-ACETAMINOPHEN 5-325 MG PO TABS: 1.0000 | ORAL_TABLET | ORAL | 0 refills | Status: DC | PRN

## 2023-01-22 MED ORDER — STERILE WATER FOR IRRIGATION IR SOLN
Status: DC | PRN
  Administered 2023-01-22: 1000 mL

## 2023-01-22 MED ORDER — ACETAMINOPHEN 10 MG/ML IV SOLN: 1000.0000 mg | Freq: Once | INTRAVENOUS | Status: DC | PRN

## 2023-01-22 MED ORDER — VANCOMYCIN HCL 1000 MG IV SOLR
INTRAVENOUS | Status: DC | PRN
  Administered 2023-01-22: 1000 mg via TOPICAL

## 2023-01-22 MED ORDER — EPHEDRINE SULFATE-NACL 50-0.9 MG/10ML-% IV SOSY
PREFILLED_SYRINGE | INTRAVENOUS | Status: DC | PRN
  Administered 2023-01-22: 5 mg via INTRAVENOUS

## 2023-01-22 MED ORDER — BUPIVACAINE HCL (PF) 0.5 % IJ SOLN
INTRAMUSCULAR | Status: DC | PRN
  Administered 2023-01-22: 10 mL via PERINEURAL

## 2023-01-22 MED ORDER — LACTATED RINGERS IV SOLN: INTRAVENOUS | Status: DC

## 2023-01-22 MED ORDER — CEFAZOLIN SODIUM-DEXTROSE 2-4 GM/100ML-% IV SOLN
2.0000 g | INTRAVENOUS | Status: AC
  Administered 2023-01-22: 2 g via INTRAVENOUS
  Filled 2023-01-22: qty 100

## 2023-01-22 MED ORDER — MIDAZOLAM HCL 2 MG/2ML IJ SOLN
2.0000 mg | INTRAMUSCULAR | Status: DC
  Administered 2023-01-22: 1 mg via INTRAVENOUS
  Filled 2023-01-22: qty 2

## 2023-01-22 MED ORDER — FENTANYL CITRATE (PF) 100 MCG/2ML IJ SOLN
INTRAMUSCULAR | Status: DC | PRN
  Administered 2023-01-22: 100 ug via INTRAVENOUS

## 2023-01-22 MED ORDER — ONDANSETRON HCL 4 MG PO TABS: 4.0000 mg | ORAL_TABLET | Freq: Three times a day (TID) | ORAL | 0 refills | Status: DC | PRN

## 2023-01-22 MED ORDER — PROPOFOL 10 MG/ML IV BOLUS
INTRAVENOUS | Status: AC
  Filled 2023-01-22: qty 20

## 2023-01-22 MED ORDER — BUPIVACAINE LIPOSOME 1.3 % IJ SUSP
INTRAMUSCULAR | Status: DC | PRN
  Administered 2023-01-22: 10 mL via PERINEURAL

## 2023-01-22 MED ORDER — ORAL CARE MOUTH RINSE
15.0000 mL | Freq: Once | OROMUCOSAL | Status: AC
Start: 2023-01-22 — End: 2023-01-22

## 2023-01-22 MED ORDER — FENTANYL CITRATE PF 50 MCG/ML IJ SOSY
100.0000 ug | PREFILLED_SYRINGE | INTRAMUSCULAR | Status: DC
  Administered 2023-01-22: 50 ug via INTRAVENOUS
  Filled 2023-01-22: qty 2

## 2023-01-22 MED ORDER — PROPOFOL 10 MG/ML IV BOLUS
INTRAVENOUS | Status: DC | PRN
  Administered 2023-01-22: 50 ug/kg/min via INTRAVENOUS
  Administered 2023-01-22: 130 mg via INTRAVENOUS

## 2023-01-22 MED ORDER — TRANEXAMIC ACID-NACL 1000-0.7 MG/100ML-% IV SOLN
1000.0000 mg | INTRAVENOUS | Status: AC
  Administered 2023-01-22: 1000 mg via INTRAVENOUS
  Filled 2023-01-22: qty 100

## 2023-01-22 MED ORDER — SUGAMMADEX SODIUM 200 MG/2ML IV SOLN
INTRAVENOUS | Status: DC | PRN
  Administered 2023-01-22 (×2): 200 mg via INTRAVENOUS

## 2023-01-22 MED ORDER — ALBUTEROL SULFATE HFA 108 (90 BASE) MCG/ACT IN AERS
INHALATION_SPRAY | RESPIRATORY_TRACT | Status: DC | PRN
Start: 2023-01-22 — End: 2023-01-22
  Administered 2023-01-22 (×3): 2 via RESPIRATORY_TRACT

## 2023-01-22 MED ORDER — 0.9 % SODIUM CHLORIDE (POUR BTL) OPTIME
TOPICAL | Status: DC | PRN
  Administered 2023-01-22: 1000 mL

## 2023-01-22 MED ORDER — CYCLOBENZAPRINE HCL 10 MG PO TABS: 10.0000 mg | ORAL_TABLET | Freq: Three times a day (TID) | ORAL | 1 refills | Status: DC | PRN

## 2023-01-22 SURGICAL SUPPLY — 60 items
BAG COUNTER SPONGE SURGICOUNT (BAG) IMPLANT
BAG ZIPLOCK 12X15 (MISCELLANEOUS) ×1 IMPLANT
BIT DRILL AR 3 NS (BIT) IMPLANT
BLADE SAW SGTL 83.5X18.5 (BLADE) ×1 IMPLANT
BNDG COHESIVE 4X5 TAN STRL LF (GAUZE/BANDAGES/DRESSINGS) ×1 IMPLANT
COOLER ICEMAN CLASSIC (MISCELLANEOUS) ×1 IMPLANT
COVER BACK TABLE 60X90IN (DRAPES) ×1 IMPLANT
COVER SURGICAL LIGHT HANDLE (MISCELLANEOUS) ×1 IMPLANT
CUP SUT UNIV REVERS 36 NEUTRAL (Cup) IMPLANT
DERMABOND ADVANCED .7 DNX12 (GAUZE/BANDAGES/DRESSINGS) ×1 IMPLANT
DRAPE SHEET LG 3/4 BI-LAMINATE (DRAPES) ×1 IMPLANT
DRAPE SURG 17X11 SM STRL (DRAPES) ×1 IMPLANT
DRAPE SURG ORHT 6 SPLT 77X108 (DRAPES) ×2 IMPLANT
DRAPE TOP 10253 STERILE (DRAPES) ×1 IMPLANT
DRAPE U-SHAPE 47X51 STRL (DRAPES) ×1 IMPLANT
DRESSING AQUACEL AG SP 3.5X6 (GAUZE/BANDAGES/DRESSINGS) ×1 IMPLANT
DRSG AQUACEL AG ADV 3.5X10 (GAUZE/BANDAGES/DRESSINGS) IMPLANT
DRSG AQUACEL AG SP 3.5X6 (GAUZE/BANDAGES/DRESSINGS) ×1 IMPLANT
DURAPREP 26ML APPLICATOR (WOUND CARE) ×1 IMPLANT
ELECT BLADE TIP CTD 4 INCH (ELECTRODE) ×1 IMPLANT
ELECT PENCIL ROCKER SW 15FT (MISCELLANEOUS) ×1 IMPLANT
ELECT REM PT RETURN 15FT ADLT (MISCELLANEOUS) ×1 IMPLANT
FACESHIELD WRAPAROUND (MASK) ×5 IMPLANT
FACESHIELD WRAPAROUND OR TEAM (MASK) ×5 IMPLANT
GLENOID UNI REV MOD 24 +2 LAT (Joint) IMPLANT
GLENOSPHERE 36 +4 LAT/24 (Joint) IMPLANT
GLOVE BIO SURGEON STRL SZ7.5 (GLOVE) ×1 IMPLANT
GLOVE BIO SURGEON STRL SZ8 (GLOVE) ×1 IMPLANT
GLOVE SS BIOGEL STRL SZ 7 (GLOVE) ×1 IMPLANT
GLOVE SS BIOGEL STRL SZ 7.5 (GLOVE) ×1 IMPLANT
GOWN STRL SURGICAL XL XLNG (GOWN DISPOSABLE) ×2 IMPLANT
INSERT HUMERAL UNI REVERS 36 3 (Insert) IMPLANT
KIT BASIN OR (CUSTOM PROCEDURE TRAY) ×1 IMPLANT
KIT TURNOVER KIT A (KITS) IMPLANT
MANIFOLD NEPTUNE II (INSTRUMENTS) ×1 IMPLANT
NDL TAPERED W/ NITINOL LOOP (MISCELLANEOUS) ×1 IMPLANT
NEEDLE TAPERED W/ NITINOL LOOP (MISCELLANEOUS) ×1 IMPLANT
NS IRRIG 1000ML POUR BTL (IV SOLUTION) ×1 IMPLANT
PACK SHOULDER (CUSTOM PROCEDURE TRAY) ×1 IMPLANT
PAD ARMBOARD 7.5X6 YLW CONV (MISCELLANEOUS) ×1 IMPLANT
PAD COLD SHLDR WRAP-ON (PAD) ×1 IMPLANT
PIN NITINOL TARGETER 2.8 (PIN) IMPLANT
PIN SET MODULAR GLENOID SYSTEM (PIN) IMPLANT
RESTRAINT HEAD UNIVERSAL NS (MISCELLANEOUS) ×1 IMPLANT
SCREW CENTRAL MODULAR 25 (Screw) IMPLANT
SCREW PERI LOCK 5.5X24 (Screw) IMPLANT
SCREW PERIPHERAL 5.5X20 LOCK (Screw) IMPLANT
SCREW PERIPHERAL 5.5X40 LOCK (Screw) IMPLANT
SLING ARM FOAM STRAP LRG (SOFTGOODS) IMPLANT
SLING ARM FOAM STRAP MED (SOFTGOODS) IMPLANT
STEM HUMERAL UNI REVERS SZ6 (Stem) IMPLANT
SUT MNCRL AB 3-0 PS2 18 (SUTURE) ×1 IMPLANT
SUT MON AB 2-0 CT1 36 (SUTURE) ×1 IMPLANT
SUT VIC AB 1 CT1 36 (SUTURE) ×1 IMPLANT
SUTURE TAPE 1.3 40 TPR END (SUTURE) ×2 IMPLANT
SUTURETAPE 1.3 40 TPR END (SUTURE) ×2 IMPLANT
TOWEL OR 17X26 10 PK STRL BLUE (TOWEL DISPOSABLE) ×1 IMPLANT
TUBE SUCTION HIGH CAP CLEAR NV (SUCTIONS) ×1 IMPLANT
TUBING CONNECTING 10 (TUBING) ×1 IMPLANT
WATER STERILE IRR 1000ML POUR (IV SOLUTION) ×2 IMPLANT

## 2023-01-22 NOTE — Transfer of Care (Signed)
Immediate Anesthesia Transfer of Care Note  Patient: Olivia Dougherty  Procedure(s) Performed: REVERSE SHOULDER ARTHROPLASTY (Right: Shoulder)  Patient Location: PACU  Anesthesia Type:General  Level of Consciousness: drowsy  Airway & Oxygen Therapy: Patient Spontanous Breathing and Patient connected to face mask  Post-op Assessment: Report given to RN  Post vital signs: Reviewed and stable  Last Vitals:  Vitals Value Taken Time  BP 130/50 01/22/23 1320  Temp 35.9 C 01/22/23 1315  Pulse 80 01/22/23 1324  Resp 22 01/22/23 1324  SpO2 92 % 01/22/23 1324  Vitals shown include unfiled device data.  Last Pain:  Vitals:   01/22/23 1028  TempSrc: Oral         Complications: No notable events documented.

## 2023-01-22 NOTE — Care Plan (Signed)
Ortho Bundle Case Management Note  Patient Details  Name: Olivia Dougherty MRN: [id number] Date of Birth: Nov 20, 1950  R Rev TSA on 01-22-23 DCP:  Home with husband PT:  [facility]           DME Arranged:  N/A DME Agency:  NA  HH Arranged:  NA HH Agency:  NA  Additional Comments: Please contact me with any questions of if this plan should need to change.  Ennis Forts, RN,CCM [facility]  [phone] 01/22/2023, 11:55 AM

## 2023-01-22 NOTE — Anesthesia Procedure Notes (Signed)
Procedure Name: Intubation Date/Time: 01/22/2023 11:44 AM  Performed by: Randa Evens, CRNAPre-anesthesia Checklist: Patient identified, Emergency Drugs available, Suction available and Patient being monitored Patient Re-evaluated:Patient Re-evaluated prior to induction Oxygen Delivery Method: Circle System Utilized Preoxygenation: Pre-oxygenation with 100% oxygen Induction Type: IV induction Ventilation: Mask ventilation without difficulty Laryngoscope Size: Glidescope and 3 Grade View: Grade I Tube type: Oral Tube size: 7.0 mm Number of attempts: 1 Airway Equipment and Method: Stylet and Oral airway Placement Confirmation: ETT inserted through vocal cords under direct vision, positive ETCO2 and breath sounds checked- equal and bilateral Secured at: 22 cm Tube secured with: Tape Dental Injury: Teeth and Oropharynx as per pre-operative assessment  Comments: By carelink paramedic

## 2023-01-22 NOTE — Anesthesia Procedure Notes (Signed)
Anesthesia Regional Block: Interscalene brachial plexus block   Pre-Anesthetic Checklist: , timeout performed,  Correct Patient, Correct Site, Correct Laterality,  Correct Procedure, Correct Position, site marked,  Risks and benefits discussed,  Surgical consent,  Pre-op evaluation,  At surgeon's request and post-op pain management  Laterality: Right  Prep: Dura Prep       Needles:  Injection technique: Single-shot  Needle Type: Echogenic Stimulator Needle     Needle Length: 5cm  Needle Gauge: 20     Additional Needles:   Procedures:,,,, ultrasound used (permanent image in chart),,    Narrative:  Start time: 01/22/2023 11:15 AM End time: 01/22/2023 11:20 AM Injection made incrementally with aspirations every 5 mL.  Performed by: Personally  Anesthesiologist: Atilano Median, DO  Additional Notes: Patient identified. Risks/Benefits/Options discussed with patient including but not limited to bleeding, infection, nerve damage, failed block, incomplete pain control. Patient expressed understanding and wished to proceed. All questions were answered. Sterile technique was used throughout the entire procedure. Please see nursing notes for vital signs. Aspirated in 5cc intervals with injection for negative confirmation. Patient was given instructions on fall risk and not to get out of bed. All questions and concerns addressed with instructions to call with any issues or inadequate analgesia.

## 2023-01-22 NOTE — Discharge Instructions (Addendum)

## 2023-01-22 NOTE — Op Note (Signed)
01/22/2023  12:46 PM  PATIENT:   Olivia Dougherty  72 y.o. female  PRE-OPERATIVE DIAGNOSIS:  Right Shoulder Rotator Cuff Tear Arthropathy  POST-OPERATIVE DIAGNOSIS: Same  PROCEDURE: Right shoulder reverse arthroplasty lysing a press-fit size 6 Arthrex stem with a neutral metathesis, +3 constrained polyethylene insert, 36/+4 glenosphere and a small/+2 baseplate  SURGEON:  Walther Sanagustin, Vania Rea M.D.  ASSISTANTS: Ralene Bathe, PA-C  Ralene Bathe, PA-C was utilized as an Geophysicist/field seismologist throughout this case, essential for help with positioning the patient, positioning extremity, tissue manipulation, implantation of the prosthesis, suture management, wound closure, and intraoperative decision-making.  ANESTHESIA:   General Endotracheal and interscalene block with Exparel  EBL: 150 cc  SPECIMEN: None  Drains: None   PATIENT DISPOSITION:  PACU - hemodynamically stable.    PLAN OF CARE: Discharge to home after PACU  Brief history:  Patient is a 72 year old female with a long history of progressively increasing right shoulder pain related to rotator cuff tear arthropathy.  Due to her increasing functional limitations and failure to respond prolonged attempts of conservative management, she is brought to the operating this time for planned right shoulder reverse arthroplasty.  Preoperatively, I counseled the patient regarding treatment options and risks versus benefits thereof.  Possible surgical complications were all reviewed including potential for bleeding, infection, neurovascular injury, persistent pain, loss of motion, anesthetic complication, failure of the implant, and possible need for additional surgery. They understand and accept and agrees with our planned procedure.   Procedure in detail:  After undergoing routine preop evaluation the patient received prophylactic antibiotics and interscalene block with Exparel was established in the holding area by the anesthesia  department.  Subsequently placed spine on the operating table and underwent the smooth induction of a general endotracheal anesthesia.  Placed into the beachchair position and appropriately padded and protected.  The right shoulder girdle region was sterilely prepped and draped in standard fashion.  Timeout was called.  Deltopectoral approach to the right shoulder made through an approximately 10 cm incision.  Skin flaps elevated dissection carried deeply deltopectoral interval developed from proximal to distal with the vein taken laterally.  Conjoined tendon mobilized and retracted medially.  Long head bicep tendon tenodesed at the upper border the pectoralis major tendon with proximal segment unroofed and excised.  Rotator cuff superiorly was split to the base of the coracoid from the apex of the bicipital groove and the subscap was separated from the lesser tuberosity and tagged with a pair of grasping suture tape sutures.  Capsular attachments were then divided from the anterior and inferior margins of the humeral neck allowing delivery of the humeral head through the wound.  Extra medullary guide was used to outline the proposed humeral head resection which we performed with an oscillating saw at approximately 20 degrees of retroversion.  Marginal osteophytes were removed with a rondure and a metal cap placed of the cut proximal humeral surface.  We then exposed the glenoid and performed a circumferential labral resection.  Guidepin was then directed into the center of the glenoid and the glenoid was reamed with the central followed by the peripheral reamer to a stable subchondral bony bed.  Preparation completed with a drill and tapped for a 30 mm lag screw.  Our baseplate was assembled and inserted with vancomycin powder applied to the threads of the lag screw and excellent fixation was achieved.  The peripheral locking screws were all then placed using standard technique with excellent fixation.  A 36/+4  glenosphere was  then impacted onto the baseplate and the central locking screw was placed.  Attention returned to the humeral canal where the hand reamer was used to open the canal and broached up to size 6 at 20 degrees of retroversion.  A neutral metaphyseal reaming guide was used to prepare the metaphysis and a trial implant showed good motion stability and soft tissue balance.  The trial was then removed.  A final implant was assembled.  The canal was irrigated cleaned and dried with vancomycin powder applied into the canal the implant was then seated with excellent fixation.  Trial reduction showed the best fit with a +3 poly.  A final +3 constrained poly was then impacted onto the implant and final reduction showed X motion stability and soft tissue balance all much to our satisfaction.  Wound was then copious irrigated.  Final hemostasis was obtained.  The subscapularis was mobilized and confirmed to have good elasticity and was repaired back to the eyelets on the collar of the implant.  Deltopectoral interval was then closed with a series of figure-of-eight #1 Vicryl sutures.  2-0 Monocryl used to close the subcu layer and intracuticular 3-0 Monocryl used to close the skin followed by Dermabond and an Aquacel dressing.  Right arm placed into a sling.  The patient was awakened, extubated, and taken to the recovery in stable condition.  Senaida Lange MD   Contact # [phone]

## 2023-01-22 NOTE — H&P (Signed)
Olivia Dougherty    Chief Complaint: Right Shoulder Rotator Cuff Tear Artropathy HPI: The patient is a 72 y.o. female with chronic and progressive increasing right shoulder pain related to severe rotator cuff tear arthropathy.  Due to increasing functional limitations and failure to respond to prolonged attempts of conservative management, patient is brought to the operating room at this time for planned right shoulder reverse arthroplasty.  Past Medical History:  Diagnosis Date   Anxiety    Arthritis    lower back   Basal cell carcinoma 11/2011   face, left leg-using chemo cream on temple area   Coronary artery disease 2005   LAD PCI with Cypher drug-eluting stent placement 3.0 x 13 mm with balloon angioplasty of diagonal.  Cardiac catheterization in 2010 showed patent stent with minimal restenosis.   Diverticulitis 12/11/2020   GERD (gastroesophageal reflux disease)    Heart disease    Heart problem    98% blockage   Hypercholesteremia    Hypertension    Hypothyroidism    Osteoarthritis    Pneumonia    as a infant   PONV (postoperative nausea and vomiting)    after 1 procedure approx 10 yrs ago   Sleep apnea    mild, could not tol cpap   Squamous cell carcinoma of skin    leg      Past Surgical History:  Procedure Laterality Date   ANGIOPLASTY  2005   CAD   98% blockage   CARDIAC CATHETERIZATION  02/2008   CATARACT EXTRACTION Right    CATARACT EXTRACTION W/PHACO Right 08/31/2019   Procedure: CATARACT EXTRACTION PHACO AND INTRAOCULAR LENS PLACEMENT (IOC) RIGHT;  Surgeon: Nevada Crane, MD;  Location: [facility];  Service: Ophthalmology;  Laterality: Right;  4.41 0:30.7   CATARACT EXTRACTION W/PHACO Left 09/26/2019   Procedure: CATARACT EXTRACTION PHACO AND INTRAOCULAR LENS PLACEMENT (IOC) LEFT 1.60 00:20.3;  Surgeon: Nevada Crane, MD;  Location: [facility];  Service: Ophthalmology;  Laterality: Left;   COLONOSCOPY WITH PROPOFOL N/A  12/29/2014   Procedure: COLONOSCOPY WITH PROPOFOL;  Surgeon: Midge Minium, MD;  Location: [facility];  Service: Endoscopy;  Laterality: N/A;   COLONOSCOPY WITH PROPOFOL N/A 12/31/2017   Procedure: COLONOSCOPY WITH PROPOFOL;  Surgeon: Midge Minium, MD;  Location: [facility];  Service: Endoscopy;  Laterality: N/A;  sleep apnea   COLONOSCOPY WITH PROPOFOL N/A 12/31/2020   Procedure: COLONOSCOPY WITH PROPOFOL;  Surgeon: Midge Minium, MD;  Location: [facility];  Service: Endoscopy;  Laterality: N/A;   CORONARY ANGIOPLASTY  2005   LAD   EYE SURGERY Right    right eye torn retina surgery   HYSTEROSCOPY  11/2001   resection of benign leiomyoma   KNEE ARTHROSCOPY Right 08/16/2014   Procedure: ARTHROSCOPY KNEE, partial lateral menisectomy and chondroplasty;  Surgeon: Donato Heinz, MD;  Location: [facility];  Service: Orthopedics;  Laterality: Right;   POLYPECTOMY  12/29/2014   Procedure: POLYPECTOMY;  Surgeon: Midge Minium, MD;  Location: [facility];  Service: Endoscopy;;   SKIN CANCER EXCISION Right 02/2018   Right leg - Squamous    THYROID CYST EXCISION Left 10/2002   left lobe thyroid excision   THYROIDECTOMY, PARTIAL Left    TUBAL LIGATION  1980   BTL   UMBILICAL HERNIA REPAIR  1980   vulvar cancer  1993   laser    Family History  Problem Relation Age of Onset   Hypertension Mother    Hypertension Father  Diabetes Father    Heart disease Father    Cervical cancer Sister    Stroke Sister    Colon cancer Neg Hx    Breast cancer Neg Hx    Ovarian cancer Neg Hx     Social History:  reports that she has never smoked. She has never used smokeless tobacco. She reports current alcohol use of about 2.0 standard drinks of alcohol per week. She reports that she does not use drugs.  BMI: Estimated body mass index is 39.32 kg/m as calculated from the following:   Height as of 01/13/23: 5\' 2"  (1.575 m).   Weight as of 01/13/23: 97.5 kg.  Lab Results   Component Value Date   ALBUMIN 4.4 01/01/2023   Diabetes: Patient does not have a diagnosis of diabetes.     Smoking Status:   reports that she has never smoked. She has never used smokeless tobacco.     No medications prior to admission.     Physical Exam: Right shoulder demonstrates painful and guarded motion is noted at recent office visits.  Globally decreased strength to manual muscle testing.  Neurovascular intact in the right upturning.  Examination otherwise as noted at recent office visits.  Imaging studies confirm changes consistent with chronic right shoulder rotator cuff tear arthropathy.  Vitals     Assessment/Plan  Impression: Right Shoulder Rotator Cuff Tear Artropathy  Plan of Action: Procedure(s): REVERSE SHOULDER ARTHROPLASTY  Iriana Artley M Taquan Bralley 01/22/2023, 6:31 AM Contact # [phone]

## 2023-01-22 NOTE — Evaluation (Signed)
Occupational Therapy Evaluation Patient Details Name: Olivia Dougherty MRN: [id number] DOB: [birth year] Today's Date: 01/22/2023   History of Present Illness 72 yo s/p Right shoulder reverse arthroplasty. PMH: anxiety, HTN, CAD, Arthritis, vulvar cancer.   Clinical Impression   PTA pt lives independently with her husband. Education provided to maintain sling at all times with the exception of bathing, dressing and when completing exercises until the surgeon states otherwise. RUE AAROM exercises completed for hand/wrist/elbow, see details below. Pt/husband educated on compensatory techniques regarding ADL while maintaining shoulder ROM within set parameters as noted below. Husband able to assist as needed with ADL.Marland Kitchen Education provided regarding use of ice machine and positioning for bed/chair. "Teach back" used and  written educational handouts for ADL, sling management, positioning, ROM and ice machine provided. Pt to follow up with Dr Rennis Chris for further therapy needs.       If plan is discharge home, recommend the following: A little help with walking and/or transfers;A lot of help with bathing/dressing/bathroom;Assistance with cooking/housework;Assist for transportation    Functional Status Assessment     Equipment Recommendations  None recommended by OT    Recommendations for Other Services       Precautions / Restrictions Precautions Precautions: Shoulder Type of Shoulder Precautions: Supple reverse protocol ABD 45; ER 20; FF 60 for ADL parameters; pendulums and lap slides allowed Shoulder Interventions: Shoulder sling/immobilizer;At all times;Off for dressing/bathing/exercises Precaution Booklet Issued: Yes (comment) Precaution Comments: Shoulder ROM ABD 45; ER 20; FF 60 for ADL only Required Braces or Orthoses: Sling Restrictions Weight Bearing Restrictions Per Provider Order: Yes RUE Weight Bearing Per Provider Order: Non weight bearing Other Position/Activity  Restrictions: no lifting/pulling pushing      Mobility Bed Mobility                    Transfers Overall transfer level: Needs assistance   Transfers: Sit to/from Stand Sit to Stand: Contact guard assist                  Balance                                           ADL either performed or assessed with clinical judgement   ADL                                         General ADL Comments: see shoulder below; Educated pt/husband on compensatory strateiges within ROM parameters; - see above; Teach back used - husband assisted with donning shirt - operated arm first; ice machine pad donned then sling - husband able to assist . Pt assisting and helping to direct husband in care. Ambulated to the bathroom with steady assist and assist for clothing management. Pt states her husband will be able to assist. Pt plans to wear gowns.      Vision         Perception         Praxis         Pertinent Vitals/Pain Pain Assessment Pain Assessment: No/denies pain     Extremity/Trunk Assessment Upper Extremity Assessment Upper Extremity Assessment: Right hand dominant;RUE deficits/detail RUE Deficits / Details: block active; P/AAROM for elbow.wrist.hand WFL RUE Coordination: decreased fine motor;decreased gross motor  Communication Communication Communication: No apparent difficulties   Cognition Arousal: Alert Behavior During Therapy: WFL for tasks assessed/performed Overall Cognitive Status: Within Functional Limits for tasks assessed                                       General Comments       Exercises Exercises: Shoulder Shoulder Exercises - demonstrated lap slides and "dangle" technique per orders Elbow Flexion: AAROM, Right, 5 reps Elbow Extension: AAROM, Right, 5 reps Wrist Flexion: AAROM, Right, 5 reps Wrist Extension: AAROM, Right, 5 reps Digit Composite Flexion: AAROM, Right, 5  reps Composite Extension: AAROM, Right, 5 reps Neck Flexion: AROM, 5 reps Neck Extension: AROM, 5 reps Neck Lateral Flexion - Right: AROM, 5 reps Neck Lateral Flexion - Left: AROM, 5 reps   Shoulder Instructions Shoulder Instructions Donning/doffing shirt without moving shoulder: Caregiver independent with task;Patient able to independently direct caregiver Method for sponge bathing under operated UE: Caregiver independent with task;Patient able to independently direct caregiver Donning/doffing sling/immobilizer: Caregiver independent with task;Patient able to independently direct caregiver Correct positioning of sling/immobilizer: Patient able to independently direct caregiver Pendulum exercises (written home exercise program):  (dangle' picture used) ROM for elbow, wrist and digits of operated UE: Caregiver independent with task;Patient able to independently direct caregiver Sling wearing schedule (on at all times/off for ADL's): Caregiver independent with task;Patient able to independently direct caregiver Proper positioning of operated UE when showering: Caregiver independent with task;Patient able to independently direct caregiver Positioning of UE while sleeping: Caregiver independent with task;Patient able to independently direct caregiver    Home Living Family/patient expects to be discharged to:: Private residence Living Arrangements: Spouse/significant other Available Help at Discharge: Family;Available 24 hours/day Type of Home: House Home Access: Stairs to enter [employer] of Steps: 5 Entrance Stairs-Rails: Right;Left Home Layout: One level     Bathroom Shower/Tub: Producer, television/film/video: Handicapped height Bathroom Accessibility: Yes How Accessible: Accessible via walker Home Equipment: Grab bars - tub/shower          Prior Functioning/Environment Prior Level of Function : Independent/Modified Independent                         OT Problem List: Decreased strength;Decreased range of motion;Decreased coordination;Decreased knowledge of precautions;Obesity;Impaired UE functional use      OT Treatment/Interventions:      OT Goals(Current goals can be found in the care plan section) Acute Rehab OT Goals Patient Stated Goal: home today OT Goal Formulation: All assessment and education complete, DC therapy  OT Frequency:      Co-evaluation              AM-PAC OT "6 Clicks" Daily Activity     Outcome Measure Help from another person eating meals?: A Little Help from another person taking care of personal grooming?: A Little Help from another person toileting, which includes using toliet, bedpan, or urinal?: A Lot Help from another person bathing (including washing, rinsing, drying)?: A Lot Help from another person to put on and taking off regular upper body clothing?: A Lot Help from another person to put on and taking off regular lower body clothing?: A Little 6 Click Score: 15   End of Session    Activity Tolerance: Patient tolerated treatment well Patient left: in chair;Other (comment) (transport, being discharged)  OT Visit Diagnosis: Muscle weakness (generalized) (M62.81)  Time: 1478-2956 OT Time Calculation (min): 38 min Charges:  OT General Charges $OT Visit: 1 Visit OT Evaluation $OT Eval Low Complexity: 1 Low OT Treatments $Self Care/Home Management : 8-22 mins $Therapeutic Exercise: 8-22 mins  Luisa Dago, OT/L   Acute OT Clinical Specialist Acute Rehabilitation Services Pager [phone] Office [phone]   [facility] 01/22/2023, 3:33 PM

## 2023-01-22 NOTE — Anesthesia Preprocedure Evaluation (Signed)
Anesthesia Evaluation  Patient identified by MRN, date of birth, ID band Patient awake    Reviewed: Allergy & Precautions, NPO status , Patient's Chart, lab work & pertinent test results  History of Anesthesia Complications (+) PONV and history of anesthetic complications  Airway Mallampati: II  TM Distance: >3 FB Neck ROM: Full    Dental no notable dental hx.    Pulmonary sleep apnea    Pulmonary exam normal        Cardiovascular hypertension, + CAD and + Cardiac Stents   Rhythm:Regular Rate:Normal     Neuro/Psych   Anxiety     negative neurological ROS     GI/Hepatic Neg liver ROS,GERD  ,,  Endo/Other  Hypothyroidism    Renal/GU negative Renal ROS  negative genitourinary   Musculoskeletal  (+) Arthritis , Osteoarthritis,    Abdominal Normal abdominal exam  (+)   Peds  Hematology   Anesthesia Other Findings   Reproductive/Obstetrics                             Anesthesia Physical Anesthesia Plan  ASA: 2  Anesthesia Plan: General and Regional   Post-op Pain Management: Regional block*   Induction: Intravenous  PONV Risk Score and Plan: 4 or greater and Ondansetron, Dexamethasone and Treatment may vary due to age or medical condition  Airway Management Planned: Mask and Oral ETT  Additional Equipment: None  Intra-op Plan:   Post-operative Plan: Extubation in OR  Informed Consent: I have reviewed the patients History and Physical, chart, labs and discussed the procedure including the risks, benefits and alternatives for the proposed anesthesia with the patient or authorized representative who has indicated his/her understanding and acceptance.     Dental advisory given  Plan Discussed with: CRNA  Anesthesia Plan Comments:        Anesthesia Quick Evaluation

## 2023-01-22 NOTE — Anesthesia Postprocedure Evaluation (Signed)
Anesthesia Post Note  Patient: Paigelyn Sturdivant Hudspeth  Procedure(s) Performed: REVERSE SHOULDER ARTHROPLASTY (Right: Shoulder)     Patient location during evaluation: PACU Anesthesia Type: Regional and General Level of consciousness: awake and alert Pain management: pain level controlled Vital Signs Assessment: post-procedure vital signs reviewed and stable Respiratory status: spontaneous breathing, nonlabored ventilation, respiratory function stable and patient connected to nasal cannula oxygen Cardiovascular status: blood pressure returned to baseline and stable Postop Assessment: no apparent nausea or vomiting Anesthetic complications: no   No notable events documented.  Last Vitals:  Vitals:   01/22/23 1345 01/22/23 1400  BP: (!) 108/45 108/72  Pulse: 83 72  Resp: 14 14  Temp:  (!) 36.4 C  SpO2: 93% 93%    Last Pain:  Vitals:   01/22/23 1400  TempSrc:   PainSc: 0-No pain                 Earl Lites P Katrina Brosh

## 2023-01-23 ENCOUNTER — Encounter (HOSPITAL_COMMUNITY): Payer: Self-pay | Admitting: Orthopedic Surgery

## 2023-01-30 ENCOUNTER — Other Ambulatory Visit: Payer: Self-pay

## 2023-02-04 DIAGNOSIS — Z471 Aftercare following joint replacement surgery: Secondary | ICD-10-CM | POA: Diagnosis not present

## 2023-02-04 DIAGNOSIS — Z96611 Presence of right artificial shoulder joint: Secondary | ICD-10-CM | POA: Diagnosis not present

## 2023-02-19 DIAGNOSIS — M25511 Pain in right shoulder: Secondary | ICD-10-CM | POA: Diagnosis not present

## 2023-02-25 DIAGNOSIS — M25611 Stiffness of right shoulder, not elsewhere classified: Secondary | ICD-10-CM | POA: Diagnosis not present

## 2023-02-25 DIAGNOSIS — M25511 Pain in right shoulder: Secondary | ICD-10-CM | POA: Diagnosis not present

## 2023-02-27 DIAGNOSIS — M25511 Pain in right shoulder: Secondary | ICD-10-CM | POA: Diagnosis not present

## 2023-02-27 DIAGNOSIS — M25611 Stiffness of right shoulder, not elsewhere classified: Secondary | ICD-10-CM | POA: Diagnosis not present

## 2023-03-03 DIAGNOSIS — M25511 Pain in right shoulder: Secondary | ICD-10-CM | POA: Diagnosis not present

## 2023-03-03 DIAGNOSIS — M25611 Stiffness of right shoulder, not elsewhere classified: Secondary | ICD-10-CM | POA: Diagnosis not present

## 2023-03-05 DIAGNOSIS — M25611 Stiffness of right shoulder, not elsewhere classified: Secondary | ICD-10-CM | POA: Diagnosis not present

## 2023-03-05 DIAGNOSIS — M25511 Pain in right shoulder: Secondary | ICD-10-CM | POA: Diagnosis not present

## 2023-03-10 DIAGNOSIS — M25511 Pain in right shoulder: Secondary | ICD-10-CM | POA: Diagnosis not present

## 2023-03-10 DIAGNOSIS — M25611 Stiffness of right shoulder, not elsewhere classified: Secondary | ICD-10-CM | POA: Diagnosis not present

## 2023-03-11 ENCOUNTER — Ambulatory Visit (HOSPITAL_BASED_OUTPATIENT_CLINIC_OR_DEPARTMENT_OTHER)
Admission: RE | Admit: 2023-03-11 | Discharge: 2023-03-11 | Disposition: A | Discharge status: Home / Self Care | Payer: Medicare Other | Source: Ambulatory Visit | Attending: Obstetrics & Gynecology | Admitting: Obstetrics & Gynecology

## 2023-03-11 ENCOUNTER — Encounter (HOSPITAL_BASED_OUTPATIENT_CLINIC_OR_DEPARTMENT_OTHER): Payer: Self-pay | Admitting: Radiology

## 2023-03-11 DIAGNOSIS — E039 Hypothyroidism, unspecified: Secondary | ICD-10-CM | POA: Diagnosis not present

## 2023-03-11 DIAGNOSIS — Z1231 Encounter for screening mammogram for malignant neoplasm of breast: Secondary | ICD-10-CM | POA: Diagnosis not present

## 2023-03-11 DIAGNOSIS — E2839 Other primary ovarian failure: Secondary | ICD-10-CM | POA: Diagnosis not present

## 2023-03-11 DIAGNOSIS — Z79890 Hormone replacement therapy (postmenopausal): Secondary | ICD-10-CM | POA: Diagnosis not present

## 2023-03-12 DIAGNOSIS — M25511 Pain in right shoulder: Secondary | ICD-10-CM | POA: Diagnosis not present

## 2023-03-12 DIAGNOSIS — M25611 Stiffness of right shoulder, not elsewhere classified: Secondary | ICD-10-CM | POA: Diagnosis not present

## 2023-03-16 ENCOUNTER — Encounter (HOSPITAL_BASED_OUTPATIENT_CLINIC_OR_DEPARTMENT_OTHER): Payer: Self-pay | Admitting: Obstetrics & Gynecology

## 2023-03-16 DIAGNOSIS — M25511 Pain in right shoulder: Secondary | ICD-10-CM | POA: Diagnosis not present

## 2023-03-16 DIAGNOSIS — M25611 Stiffness of right shoulder, not elsewhere classified: Secondary | ICD-10-CM | POA: Diagnosis not present

## 2023-03-18 DIAGNOSIS — M25511 Pain in right shoulder: Secondary | ICD-10-CM | POA: Diagnosis not present

## 2023-03-18 DIAGNOSIS — M25611 Stiffness of right shoulder, not elsewhere classified: Secondary | ICD-10-CM | POA: Diagnosis not present

## 2023-03-24 DIAGNOSIS — G894 Chronic pain syndrome: Secondary | ICD-10-CM | POA: Diagnosis not present

## 2023-03-24 DIAGNOSIS — M19011 Primary osteoarthritis, right shoulder: Secondary | ICD-10-CM | POA: Diagnosis not present

## 2023-03-24 DIAGNOSIS — M25511 Pain in right shoulder: Secondary | ICD-10-CM | POA: Diagnosis not present

## 2023-03-24 DIAGNOSIS — M75121 Complete rotator cuff tear or rupture of right shoulder, not specified as traumatic: Secondary | ICD-10-CM | POA: Diagnosis not present

## 2023-03-24 DIAGNOSIS — M25611 Stiffness of right shoulder, not elsewhere classified: Secondary | ICD-10-CM | POA: Diagnosis not present

## 2023-03-25 DIAGNOSIS — H0289 Other specified disorders of eyelid: Secondary | ICD-10-CM | POA: Diagnosis not present

## 2023-03-25 DIAGNOSIS — L259 Unspecified contact dermatitis, unspecified cause: Secondary | ICD-10-CM | POA: Diagnosis not present

## 2023-03-25 DIAGNOSIS — Z961 Presence of intraocular lens: Secondary | ICD-10-CM | POA: Diagnosis not present

## 2023-03-31 DIAGNOSIS — M25511 Pain in right shoulder: Secondary | ICD-10-CM | POA: Diagnosis not present

## 2023-03-31 DIAGNOSIS — M25611 Stiffness of right shoulder, not elsewhere classified: Secondary | ICD-10-CM | POA: Diagnosis not present

## 2023-04-02 DIAGNOSIS — H04123 Dry eye syndrome of bilateral lacrimal glands: Secondary | ICD-10-CM | POA: Diagnosis not present

## 2023-04-02 DIAGNOSIS — Z961 Presence of intraocular lens: Secondary | ICD-10-CM | POA: Diagnosis not present

## 2023-04-02 DIAGNOSIS — L259 Unspecified contact dermatitis, unspecified cause: Secondary | ICD-10-CM | POA: Diagnosis not present

## 2023-04-07 DIAGNOSIS — M25611 Stiffness of right shoulder, not elsewhere classified: Secondary | ICD-10-CM | POA: Diagnosis not present

## 2023-04-07 DIAGNOSIS — M25511 Pain in right shoulder: Secondary | ICD-10-CM | POA: Diagnosis not present

## 2023-04-09 DIAGNOSIS — M25511 Pain in right shoulder: Secondary | ICD-10-CM | POA: Diagnosis not present

## 2023-04-09 DIAGNOSIS — M25611 Stiffness of right shoulder, not elsewhere classified: Secondary | ICD-10-CM | POA: Diagnosis not present

## 2023-04-13 DIAGNOSIS — M25611 Stiffness of right shoulder, not elsewhere classified: Secondary | ICD-10-CM | POA: Diagnosis not present

## 2023-04-13 DIAGNOSIS — M25511 Pain in right shoulder: Secondary | ICD-10-CM | POA: Diagnosis not present

## 2023-04-17 DIAGNOSIS — M25511 Pain in right shoulder: Secondary | ICD-10-CM | POA: Diagnosis not present

## 2023-04-17 DIAGNOSIS — M25611 Stiffness of right shoulder, not elsewhere classified: Secondary | ICD-10-CM | POA: Diagnosis not present

## 2023-04-20 DIAGNOSIS — Z96611 Presence of right artificial shoulder joint: Secondary | ICD-10-CM | POA: Diagnosis not present

## 2023-04-20 DIAGNOSIS — Z471 Aftercare following joint replacement surgery: Secondary | ICD-10-CM | POA: Diagnosis not present

## 2023-05-14 DIAGNOSIS — Z8669 Personal history of other diseases of the nervous system and sense organs: Secondary | ICD-10-CM | POA: Diagnosis not present

## 2023-05-14 DIAGNOSIS — H26491 Other secondary cataract, right eye: Secondary | ICD-10-CM | POA: Diagnosis not present

## 2023-05-14 DIAGNOSIS — Z961 Presence of intraocular lens: Secondary | ICD-10-CM | POA: Diagnosis not present

## 2023-05-14 DIAGNOSIS — L259 Unspecified contact dermatitis, unspecified cause: Secondary | ICD-10-CM | POA: Diagnosis not present

## 2023-06-16 DIAGNOSIS — M19011 Primary osteoarthritis, right shoulder: Secondary | ICD-10-CM | POA: Diagnosis not present

## 2023-06-16 DIAGNOSIS — M25511 Pain in right shoulder: Secondary | ICD-10-CM | POA: Diagnosis not present

## 2023-06-16 DIAGNOSIS — G894 Chronic pain syndrome: Secondary | ICD-10-CM | POA: Diagnosis not present

## 2023-06-16 DIAGNOSIS — M75121 Complete rotator cuff tear or rupture of right shoulder, not specified as traumatic: Secondary | ICD-10-CM | POA: Diagnosis not present

## 2023-06-26 ENCOUNTER — Other Ambulatory Visit: Payer: Self-pay | Admitting: Family Medicine

## 2023-07-02 ENCOUNTER — Ambulatory Visit: Payer: Self-pay | Admitting: Family Medicine

## 2023-07-08 DIAGNOSIS — D2262 Melanocytic nevi of left upper limb, including shoulder: Secondary | ICD-10-CM | POA: Diagnosis not present

## 2023-07-08 DIAGNOSIS — L821 Other seborrheic keratosis: Secondary | ICD-10-CM | POA: Diagnosis not present

## 2023-07-08 DIAGNOSIS — D045 Carcinoma in situ of skin of trunk: Secondary | ICD-10-CM | POA: Diagnosis not present

## 2023-07-08 DIAGNOSIS — D485 Neoplasm of uncertain behavior of skin: Secondary | ICD-10-CM | POA: Diagnosis not present

## 2023-07-08 DIAGNOSIS — Z85828 Personal history of other malignant neoplasm of skin: Secondary | ICD-10-CM | POA: Diagnosis not present

## 2023-07-08 DIAGNOSIS — D225 Melanocytic nevi of trunk: Secondary | ICD-10-CM | POA: Diagnosis not present

## 2023-07-08 DIAGNOSIS — D2261 Melanocytic nevi of right upper limb, including shoulder: Secondary | ICD-10-CM | POA: Diagnosis not present

## 2023-07-13 ENCOUNTER — Ambulatory Visit (INDEPENDENT_AMBULATORY_CARE_PROVIDER_SITE_OTHER): Admitting: Family Medicine

## 2023-07-13 ENCOUNTER — Encounter: Payer: Self-pay | Admitting: Family Medicine

## 2023-07-13 VITALS — BP 107/54 | HR 75 | Resp 16 | Ht 62.0 in | Wt 218.0 lb

## 2023-07-13 DIAGNOSIS — E039 Hypothyroidism, unspecified: Secondary | ICD-10-CM | POA: Diagnosis not present

## 2023-07-13 DIAGNOSIS — E7800 Pure hypercholesterolemia, unspecified: Secondary | ICD-10-CM

## 2023-07-13 DIAGNOSIS — M150 Primary generalized (osteo)arthritis: Secondary | ICD-10-CM | POA: Diagnosis not present

## 2023-07-13 DIAGNOSIS — M2550 Pain in unspecified joint: Secondary | ICD-10-CM | POA: Diagnosis not present

## 2023-07-13 DIAGNOSIS — I10 Essential (primary) hypertension: Secondary | ICD-10-CM | POA: Diagnosis not present

## 2023-07-13 NOTE — Progress Notes (Unsigned)
 Established Patient Office Visit  Subjective   Patient ID: Olivia Dougherty, female    DOB: 1950/09/29  Age: 73 y.o. MRN: [id number]  Chief Complaint  Patient presents with   Medical Management of Chronic Issues    6 month follow-up HTN, Hyperlipidemia   Mystery Schrupp is a 73 y/o female with hypertension, hyperlipidemia, and OA following up for chronic disease management. Overall, she reports feeling well today. Her mood is generally good though it has been a stressful year in the house between her shoulder surgery and her husbands cancer treatment though he is in remission. She does endorse some arthritis pain that she is seeing a pain management doctor in [place] for. She recently had a right reverse total shoulder replacement which has done very well for her shoulder pain and has preserved good mobility. She is unable to take NSAIDs for her arthritis because of her Plavix  but is taking tylenol  and gabapentin which provide some relief. She is not thinking she needs anything additional at this time for her pain. She is not having any issues taking her medications for her hypertension and hyperlipidemia and has not had any undesirable side effects to date.   Past Medical History:  Diagnosis Date   Anxiety    Arthritis    lower back   Basal cell carcinoma 11/2011   face, left leg-using chemo cream on temple area   Coronary artery disease 2005   LAD PCI with Cypher drug-eluting stent placement 3.0 x 13 mm with balloon angioplasty of diagonal.  Cardiac catheterization in 2010 showed patent stent with minimal restenosis.   Diverticulitis 12/11/2020   GERD (gastroesophageal reflux disease)    Heart disease    Heart problem    98% blockage   Hypercholesteremia    Hypertension    Hypothyroidism    Osteoarthritis    Pneumonia    as a infant   PONV (postoperative nausea and vomiting)    after 1 procedure approx 10 yrs ago   Sleep apnea    mild, could not tol cpap   Squamous cell  carcinoma of skin    leg   Past Surgical History:  Procedure Laterality Date   ANGIOPLASTY  2005   CAD   98% blockage   CARDIAC CATHETERIZATION  02/2008   CATARACT EXTRACTION Right    CATARACT EXTRACTION W/PHACO Right 08/31/2019   Procedure: CATARACT EXTRACTION PHACO AND INTRAOCULAR LENS PLACEMENT (IOC) RIGHT;  Surgeon: Rosa College, MD;  Location: [facility];  Service: Ophthalmology;  Laterality: Right;  4.41 0:30.7   CATARACT EXTRACTION W/PHACO Left 09/26/2019   Procedure: CATARACT EXTRACTION PHACO AND INTRAOCULAR LENS PLACEMENT (IOC) LEFT 1.60 00:20.3;  Surgeon: Rosa College, MD;  Location: [facility];  Service: Ophthalmology;  Laterality: Left;   COLONOSCOPY WITH PROPOFOL  N/A 12/29/2014   Procedure: COLONOSCOPY WITH PROPOFOL ;  Surgeon: Marnee Sink, MD;  Location: [facility];  Service: Endoscopy;  Laterality: N/A;   COLONOSCOPY WITH PROPOFOL  N/A 12/31/2017   Procedure: COLONOSCOPY WITH PROPOFOL ;  Surgeon: Marnee Sink, MD;  Location: [facility];  Service: Endoscopy;  Laterality: N/A;  sleep apnea   COLONOSCOPY WITH PROPOFOL  N/A 12/31/2020   Procedure: COLONOSCOPY WITH PROPOFOL ;  Surgeon: Marnee Sink, MD;  Location: [facility];  Service: Endoscopy;  Laterality: N/A;   CORONARY ANGIOPLASTY  2005   LAD   EYE SURGERY Right    right eye torn retina surgery   HYSTEROSCOPY  11/2001   resection of benign leiomyoma  KNEE ARTHROSCOPY Right 08/16/2014   Procedure: ARTHROSCOPY KNEE, partial lateral menisectomy and chondroplasty;  Surgeon: Arlyne Lame, MD;  Location: [facility];  Service: Orthopedics;  Laterality: Right;   POLYPECTOMY  12/29/2014   Procedure: POLYPECTOMY;  Surgeon: Marnee Sink, MD;  Location: [facility];  Service: Endoscopy;;   REVERSE SHOULDER ARTHROPLASTY Right 01/22/2023   Procedure: REVERSE SHOULDER ARTHROPLASTY;  Surgeon: Ellard Gunning, MD;  Location: [facility];  Service: Orthopedics;  Laterality: Right;  120  min   SKIN CANCER EXCISION Right 02/2018   Right leg - Squamous    THYROID  CYST EXCISION Left 10/2002   left lobe thyroid  excision   THYROIDECTOMY, PARTIAL Left    TUBAL LIGATION  1980   BTL   UMBILICAL HERNIA REPAIR  1980   vulvar cancer  1993   laser    Objective:     BP (!) 107/54 (BP Location: Right Arm, Patient Position: Sitting, Cuff Size: Large)   Pulse 75   Resp 16   Ht 5' 2 (1.575 m)   Wt 218 lb (98.9 kg)   LMP 01/28/2007 (Approximate)   SpO2 98%   BMI 39.87 kg/m  BP Readings from Last 3 Encounters:  07/13/23 (!) 107/54  01/22/23 108/72  01/13/23 (!) 153/75   Wt Readings from Last 3 Encounters:  07/13/23 218 lb (98.9 kg)  01/22/23 215 lb (97.5 kg)  01/13/23 215 lb (97.5 kg)      Physical Exam Constitutional:      General: She is not in acute distress.    Appearance: Normal appearance. She is obese.  HENT:     Head: Normocephalic and atraumatic.     Right Ear: External ear normal.     Left Ear: External ear normal.     Nose: Nose normal.     Mouth/Throat:     Mouth: Mucous membranes are moist.     Pharynx: Oropharynx is clear. No oropharyngeal exudate.   Eyes:     General: No scleral icterus.    Extraocular Movements: Extraocular movements intact.     Pupils: Pupils are equal, round, and reactive to light.    Cardiovascular:     Rate and Rhythm: Normal rate and regular rhythm.     Pulses: Normal pulses.     Heart sounds: Normal heart sounds. No murmur heard. Pulmonary:     Effort: Pulmonary effort is normal. No respiratory distress.     Breath sounds: No wheezing or rales.  Abdominal:     General: There is no distension.     Tenderness: There is no abdominal tenderness.   Musculoskeletal:        General: Normal range of motion.     Cervical back: Normal range of motion.   Skin:    General: Skin is warm and dry.     Coloration: Skin is not jaundiced or pale.     Findings: No bruising.   Neurological:     General: No focal deficit  present.     Mental Status: She is alert and oriented to person, place, and time. Mental status is at baseline.     Cranial Nerves: No cranial nerve deficit.     Motor: No weakness.     Deep Tendon Reflexes: Reflexes normal.   Psychiatric:        Mood and Affect: Mood normal.        Behavior: Behavior normal.        Thought Content: Thought content normal.  Judgment: Judgment normal.    No results found for any visits on 07/13/23.  Last CBC Lab Results  Component Value Date   WBC 5.3 01/01/2023   HGB 13.9 01/01/2023   HCT 42.6 01/01/2023   MCV 95 01/01/2023   MCH 31.1 01/01/2023   RDW 11.4 (L) 01/01/2023   PLT 233 01/01/2023   Last metabolic panel Lab Results  Component Value Date   GLUCOSE 95 01/01/2023   NA 145 (H) 01/01/2023   K 4.0 01/01/2023   CL 105 01/01/2023   CO2 22 01/01/2023   BUN 12 01/01/2023   CREATININE 0.67 01/01/2023   EGFR 94 01/01/2023   CALCIUM  9.4 01/01/2023   PROT 6.4 01/01/2023   ALBUMIN 4.4 01/01/2023   LABGLOB 2.0 01/01/2023   AGRATIO 1.8 12/23/2021   BILITOT 0.6 01/01/2023   ALKPHOS 97 01/01/2023   AST 26 01/01/2023   ALT 18 01/01/2023   ANIONGAP 9 06/01/2020   Last lipids Lab Results  Component Value Date   CHOL 162 01/01/2023   HDL 65 01/01/2023   LDLCALC 76 01/01/2023   TRIG 117 01/01/2023   CHOLHDL 2.5 01/01/2023   Last thyroid  functions Lab Results  Component Value Date   TSH 1.600 01/01/2023      The 10-year ASCVD risk score (Arnett DK, et al., 2019) is: 9.2%    Assessment & Plan:   Problem List Items Addressed This Visit       Cardiovascular and Mediastinum   Essential hypertension - Primary   Blood pressure well-controlled on current medications. Reading borderline low in the office today but patient denies experiencing any symptomatic hypotension. - Continue losartan  100 mg daily - Continue metoprolol  25 mg twice daily          Endocrine   Acquired hypothyroidism   Well-managed on  Levothyroxine ; has not had any hyperthyroid symptoms. - Continue Synthroid  (levothyroxine ) 75 mcg daily          Musculoskeletal and Integument   Osteoarthritis   Moderately well managed with tylenol  and gabapentin prescribed by her pain management physician. She is unfortunately unable to take NSAIDs because of her Plavix . She and her pain management doctor decided not to go for any more heavy medication at this stage and will continue on with the tylenol  and gabapentin for now. -follow up with pain management for arthiritis        Other   Hyperlipidemia   Cholesterol levels well controlled for the past 6 years. - Continue rosuvastatin  10 mg daily - Continue Zetia  10 mg daily      Morbid obesity (HCC)   BMI 39 and associated with HTN and HLD -counseled on importance of exercise and weight loss  -Mentioned weight loss can help with joint pains      Polyarthralgia   Recent labs have been chronically stable so we will hold off on labs today. We will check levels at her annual visit in December.  Return in about 6 months (around 01/12/2024) for AWV.    Monda Angry, Medical Student  Patient seen along with MS3 student, Luther Saltness. I personally evaluated this patient along with the student, and verified all aspects of the history, physical exam, and medical decision making as documented by the student. I agree with the student's documentation and have made all necessary edits.  Bacigalupo, Stan Eans, MD, MPH [facility]

## 2023-07-13 NOTE — Assessment & Plan Note (Signed)
 Moderately well managed with tylenol  and gabapentin prescribed by her pain management physician. She is unfortunately unable to take NSAIDs because of her Plavix . She and her pain management doctor decided not to go for any more heavy medication at this stage and will continue on with the tylenol  and gabapentin for now. -follow up with pain management for arthiritis

## 2023-07-13 NOTE — Assessment & Plan Note (Signed)
 Blood pressure well-controlled on current medications. Reading borderline low in the office today but patient denies experiencing any symptomatic hypotension. - Continue losartan  100 mg daily - Continue metoprolol  25 mg twice daily

## 2023-07-13 NOTE — Assessment & Plan Note (Signed)
 Well-managed on Levothyroxine ; has not had any hyperthyroid symptoms. - Continue Synthroid  (levothyroxine ) 75 mcg daily

## 2023-07-13 NOTE — Assessment & Plan Note (Signed)
 BMI 39 and associated with HTN and HLD -counseled on importance of exercise and weight loss  -Mentioned weight loss can help with joint pains

## 2023-07-13 NOTE — Assessment & Plan Note (Signed)
 Cholesterol levels well controlled for the past 6 years. - Continue rosuvastatin  10 mg daily - Continue Zetia  10 mg daily

## 2023-07-16 DIAGNOSIS — M65311 Trigger thumb, right thumb: Secondary | ICD-10-CM | POA: Diagnosis not present

## 2023-07-27 DIAGNOSIS — D045 Carcinoma in situ of skin of trunk: Secondary | ICD-10-CM | POA: Diagnosis not present

## 2023-08-01 IMAGING — MG MM DIGITAL SCREENING BILAT W/ TOMO AND CAD
6 of 10 series · 6 of 30 positions shown · non-contrast
Comparison: Previous exam(s).

CLINICAL DATA: Screening.

EXAM:
DIGITAL SCREENING BILATERAL MAMMOGRAM WITH TOMOSYNTHESIS AND CAD
TECHNIQUE: Bilateral screening digital craniocaudal and mediolateral oblique
mammograms were obtained. Bilateral screening digital breast
tomosynthesis was performed. The images were evaluated with
computer-aided detection.

[L CC synth-2D]
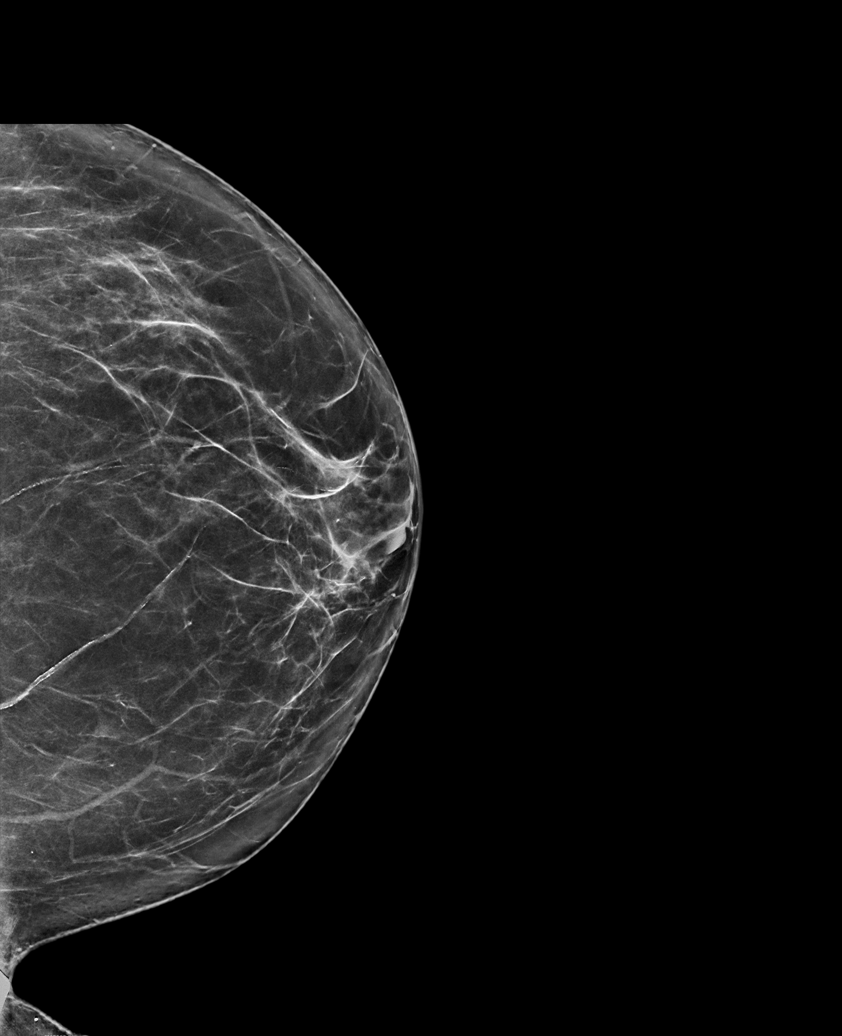

[L MLO synth-2D]
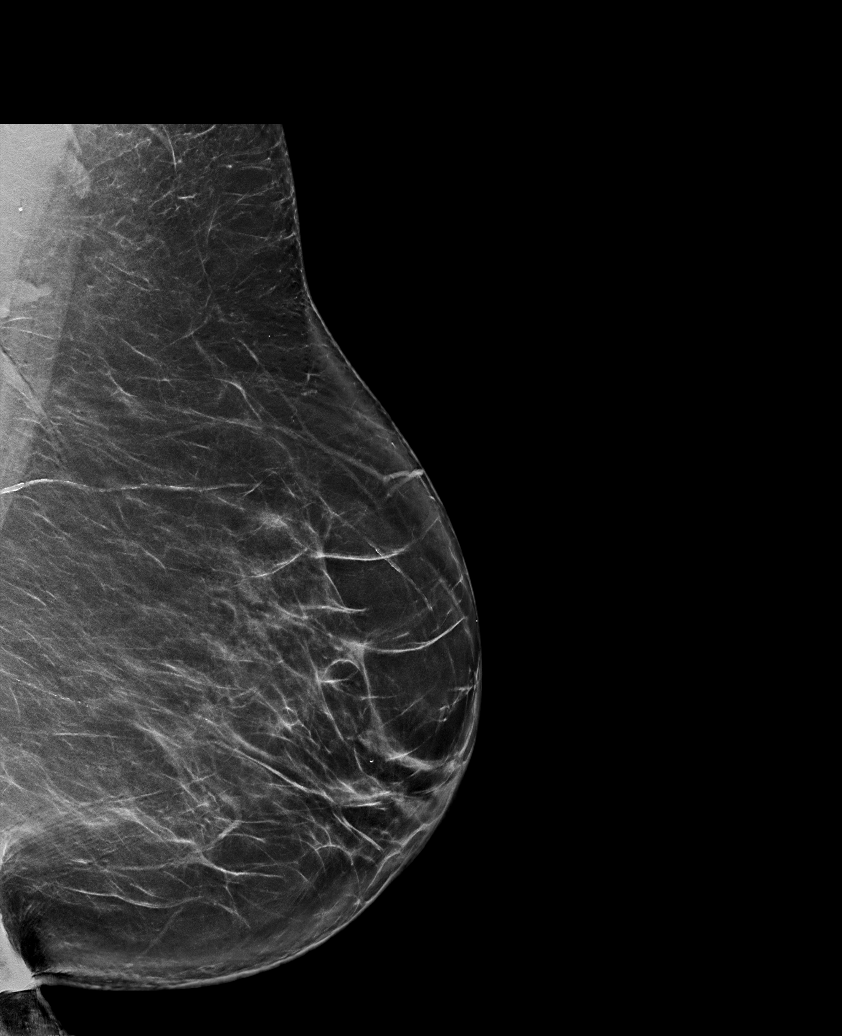

[R MLO synth-2D (1 of 2)]
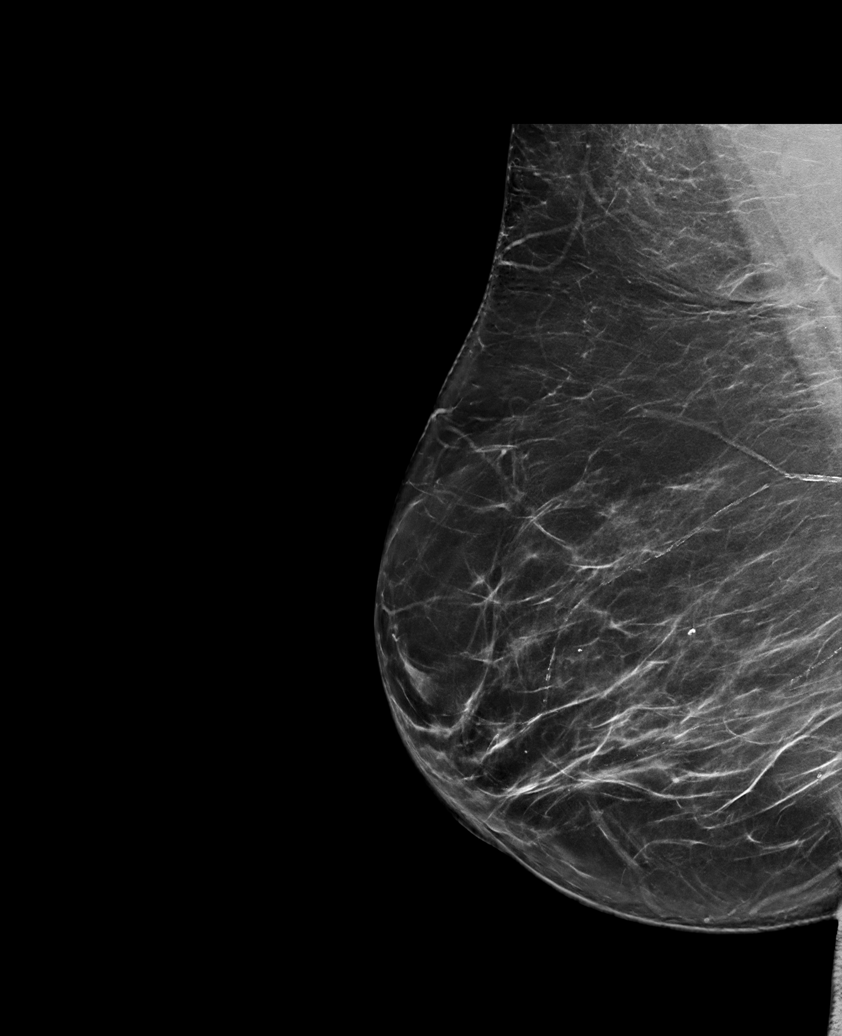

[R MLO synth-2D (2 of 2)]
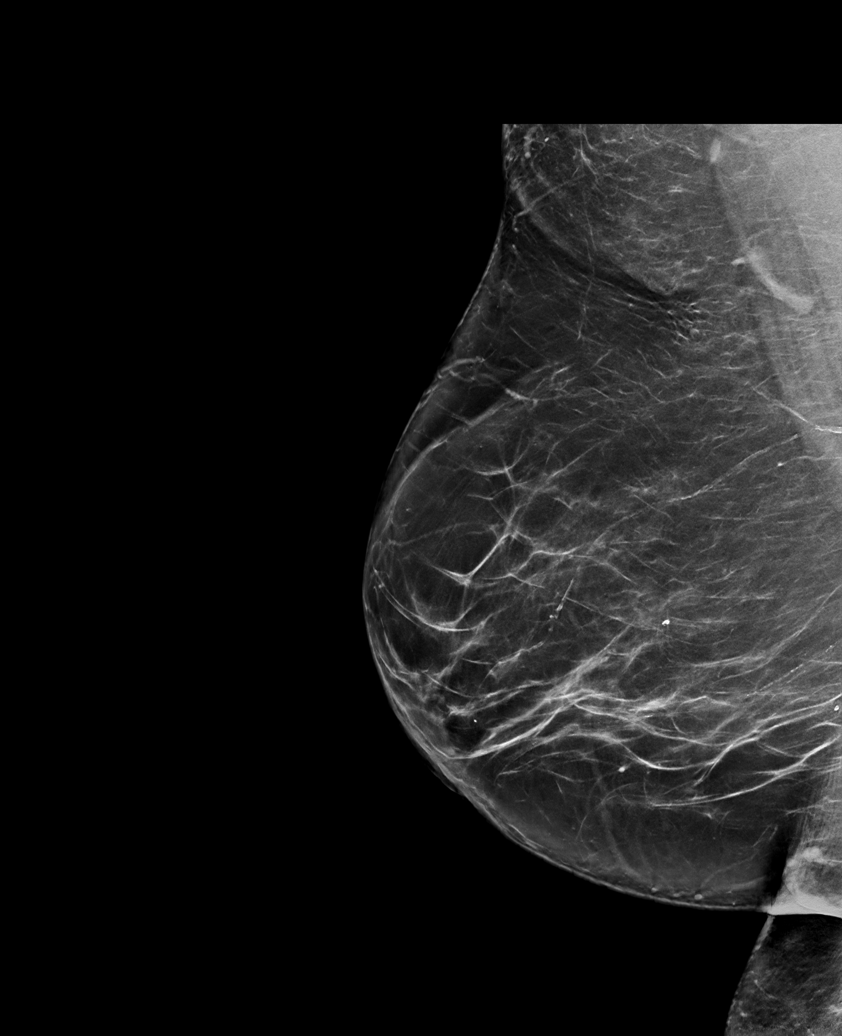

[R CC synth-2D]
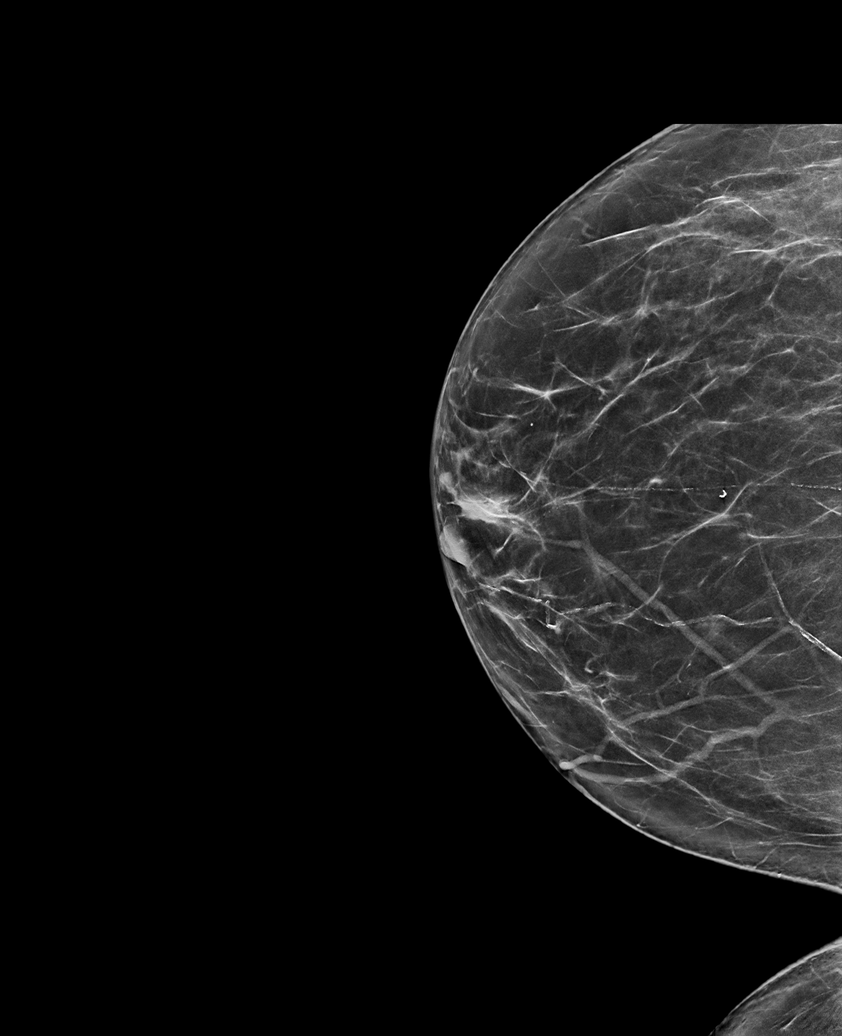

[L MLO tomo · tomo slice 45/88.0]
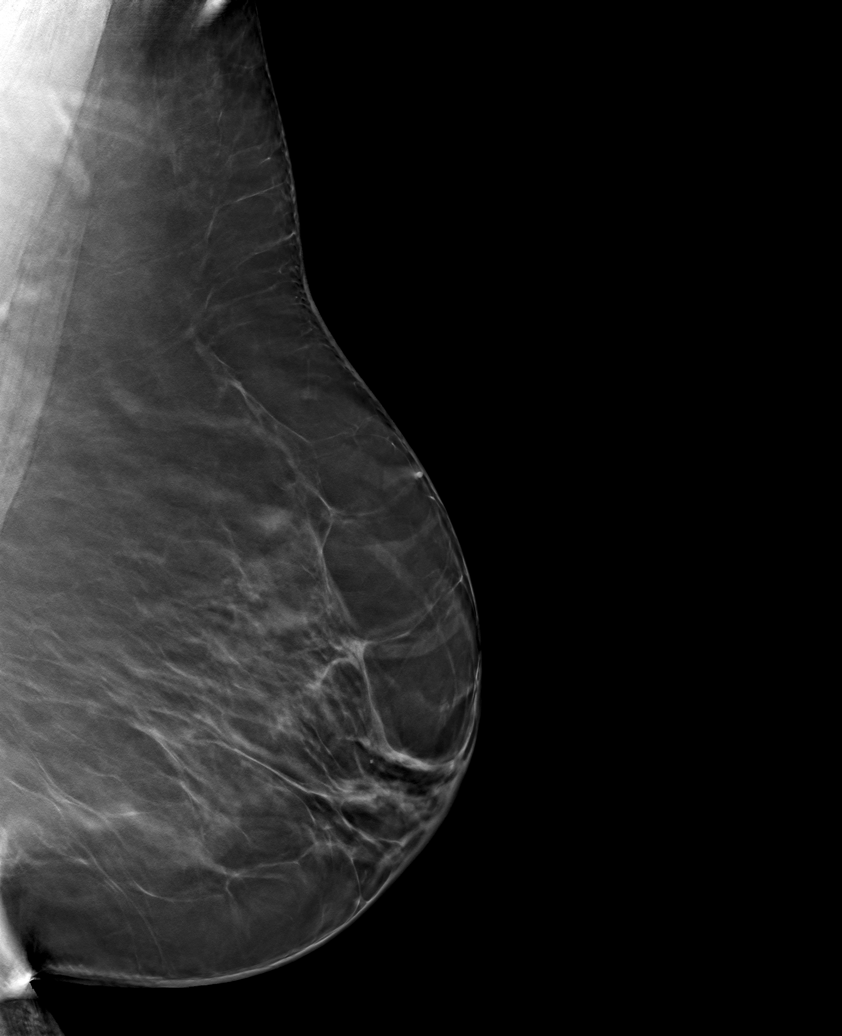

[6 of 30 positions shown; findings below may reference images not displayed]

ACR Breast Density Category b: There are scattered areas of
fibroglandular density.
FINDINGS: In the left breast, a possible asymmetry warrants further
evaluation. In the right breast, no findings suspicious for
malignancy.
IMPRESSION: Further evaluation is suggested for possible asymmetry in the left
breast.

RECOMMENDATION:
Diagnostic mammogram and possibly ultrasound of the left breast.
(Code:[id number])

The patient will be contacted regarding the findings, and additional
imaging will be scheduled.

BI-RADS CATEGORY  0: Incomplete. Need additional imaging evaluation
and/or prior mammograms for comparison.

## 2023-08-10 ENCOUNTER — Other Ambulatory Visit: Payer: Self-pay | Admitting: Cardiovascular Disease

## 2023-08-11 ENCOUNTER — Other Ambulatory Visit: Payer: Self-pay

## 2023-08-11 MED ORDER — METOPROLOL TARTRATE 25 MG PO TABS: 25.0000 mg | ORAL_TABLET | Freq: Two times a day (BID) | ORAL | 0 refills | Status: DC

## 2023-08-11 MED ORDER — ROSUVASTATIN CALCIUM 10 MG PO TABS: 10.0000 mg | ORAL_TABLET | Freq: Every day | ORAL | 0 refills | Status: DC

## 2023-08-11 MED ORDER — CLOPIDOGREL BISULFATE 75 MG PO TABS: 75.0000 mg | ORAL_TABLET | Freq: Every day | ORAL | 0 refills | Status: DC

## 2023-09-01 DIAGNOSIS — H1013 Acute atopic conjunctivitis, bilateral: Secondary | ICD-10-CM | POA: Diagnosis not present

## 2023-09-01 DIAGNOSIS — L259 Unspecified contact dermatitis, unspecified cause: Secondary | ICD-10-CM | POA: Diagnosis not present

## 2023-09-21 ENCOUNTER — Other Ambulatory Visit: Payer: Self-pay | Admitting: Family Medicine

## 2023-09-22 DIAGNOSIS — H02825 Cysts of left lower eyelid: Secondary | ICD-10-CM | POA: Diagnosis not present

## 2023-09-22 DIAGNOSIS — H26493 Other secondary cataract, bilateral: Secondary | ICD-10-CM | POA: Diagnosis not present

## 2023-09-22 DIAGNOSIS — H04202 Unspecified epiphora, left lacrimal gland: Secondary | ICD-10-CM | POA: Diagnosis not present

## 2023-09-22 DIAGNOSIS — Z961 Presence of intraocular lens: Secondary | ICD-10-CM | POA: Diagnosis not present

## 2023-09-22 NOTE — Telephone Encounter (Signed)
 Requested Prescriptions  Pending Prescriptions Disp Refills   levothyroxine  (SYNTHROID ) 75 MCG tablet [Pharmacy Med Name: LEVOTHYROXINE  75 MCG TABLET] 90 tablet 1    Sig: TAKE 1 TABLET BY MOUTH DAILY BEFORE BREAKFAST     Endocrinology:  Hypothyroid Agents Passed - 09/22/2023 12:52 PM      Passed - TSH in normal range and within 360 days    TSH  Date Value Ref Range Status  01/01/2023 1.600 0.450 - 4.500 uIU/mL Final         Passed - Valid encounter within last 12 months    Recent Outpatient Visits           2 months ago Essential hypertension   [place] [facility] [place], Jon HERO, MD       Future Appointments             In 1 month Darron, Deatrice LABOR, MD [facility]

## 2023-10-28 DIAGNOSIS — M19011 Primary osteoarthritis, right shoulder: Secondary | ICD-10-CM | POA: Diagnosis not present

## 2023-10-28 DIAGNOSIS — M75121 Complete rotator cuff tear or rupture of right shoulder, not specified as traumatic: Secondary | ICD-10-CM | POA: Diagnosis not present

## 2023-10-28 DIAGNOSIS — M25511 Pain in right shoulder: Secondary | ICD-10-CM | POA: Diagnosis not present

## 2023-10-28 DIAGNOSIS — G894 Chronic pain syndrome: Secondary | ICD-10-CM | POA: Diagnosis not present

## 2023-11-05 ENCOUNTER — Ambulatory Visit: Attending: Cardiovascular Disease | Admitting: Cardiovascular Disease

## 2023-11-05 ENCOUNTER — Encounter: Payer: Self-pay | Admitting: Cardiovascular Disease

## 2023-11-05 VITALS — BP 122/69 | HR 54 | Ht 62.0 in | Wt 217.2 lb

## 2023-11-05 DIAGNOSIS — I10 Essential (primary) hypertension: Secondary | ICD-10-CM | POA: Insufficient documentation

## 2023-11-05 DIAGNOSIS — I2510 Atherosclerotic heart disease of native coronary artery without angina pectoris: Secondary | ICD-10-CM | POA: Insufficient documentation

## 2023-11-05 DIAGNOSIS — E7800 Pure hypercholesterolemia, unspecified: Secondary | ICD-10-CM | POA: Diagnosis not present

## 2023-11-05 MED ORDER — ROSUVASTATIN CALCIUM 20 MG PO TABS: 20.0000 mg | ORAL_TABLET | Freq: Every day | ORAL | 3 refills | Status: DC

## 2023-11-05 MED ORDER — METOPROLOL TARTRATE 25 MG PO TABS: 12.5000 mg | ORAL_TABLET | Freq: Two times a day (BID) | ORAL | 3 refills | Status: DC

## 2023-11-05 NOTE — Patient Instructions (Signed)
 Medication Instructions:  Your physician recommends the following medication changes.  DECREASE: Metoprolol  from 25 mg to 12.5 mg twice daily  INCREASE: Crestor  from 10 mg to  20 mg by mouth once daily  *If you need a refill on your cardiac medications before your next appointment, please call your pharmacy*  Lab Work: No labs ordered today  If you have labs (blood work) drawn today and your tests are completely normal, you will receive your results only by: MyChart Message (if you have MyChart) OR A paper copy in the mail If you have any lab test that is abnormal or we need to change your treatment, we will call you to review the results.  Testing/Procedures: No test ordered today   Follow-Up: At [facility], you and your health needs are our priority.  As part of our continuing mission to provide you with exceptional heart care, our providers are all part of one team.  This team includes your primary Cardiologist (physician) and Advanced Practice Providers or APPs (Physician Assistants and Nurse Practitioners) who all work together to provide you with the care you need, when you need it.  Your next appointment:   12 month(s)  Provider:   You may see Deatrice Cage, MD or one of the following Advanced Practice Providers on your designated Care Team:   Lonni Meager, NP Lesley Maffucci, PA-C Bernardino Bring, PA-C Cadence Cohoe, PA-C Tylene Lunch, NP Barnie Hila, NP    We recommend signing up for the patient portal called MyChart.  Sign up information is provided on this After Visit Summary.  MyChart is used to connect with patients for Virtual Visits (Telemedicine).  Patients are able to view lab/test results, encounter notes, upcoming appointments, etc.  Non-urgent messages can be sent to your provider as well.   To learn more about what you can do with MyChart, go to [email or web address].

## 2023-11-05 NOTE — Progress Notes (Signed)
 Cardiology Office Note   Date:  11/05/2023   ID:  Olivia Dougherty, DOB Nov 05, 1950, MRN [id number]  PCP:  Myrla Jon HERO, MD  Cardiologist:   Deatrice Cage, MD   Chief Complaint  Patient presents with   Follow-up    12 month f/u c/o dizziness. Meds reviewed verbally with pt.      History of Present Illness: Olivia Dougherty is a 73 y.o. female who is here today for follow-up visit regarding coronary artery disease.  She underwent LAD PCI and Cypher drug-eluting stent placement with balloon angioplasty of diagonal in 2005 by Dr. Maye.  No cardiac events since then.  Cardiac catheterization in 2010 showed patent stent with minimal restenosis. She has known history of hyperlipidemia with intolerance to most statins due to myalgia.  She had CTA of the coronary arteries in 2017 which showed minimal plaque with no obstructive disease and patent LAD stent. Echocardiogram in September 2022 showed normal LV systolic function with no significant valvular abnormalities.  She underwent right shoulder surgery last year.  She has been doing well from a cardiac standpoint with no chest pain or shortness of breath.  She does report dizziness that started about 2 weeks ago.  No syncope or presyncope.   Past Medical History:  Diagnosis Date   Anxiety    Arthritis    lower back   Basal cell carcinoma 11/2011   face, left leg-using chemo cream on temple area   Coronary artery disease 2005   LAD PCI with Cypher drug-eluting stent placement 3.0 x 13 mm with balloon angioplasty of diagonal.  Cardiac catheterization in 2010 showed patent stent with minimal restenosis.   Diverticulitis 12/11/2020   GERD (gastroesophageal reflux disease)    Heart disease    Heart problem    98% blockage   Hypercholesteremia    Hypertension    Hypothyroidism    Osteoarthritis    Pneumonia    as a infant   PONV (postoperative nausea and vomiting)    after 1 procedure approx 10 yrs ago    Sleep apnea    mild, could not tol cpap   Squamous cell carcinoma of skin    leg    Past Surgical History:  Procedure Laterality Date   ANGIOPLASTY  2005   CAD   98% blockage   CARDIAC CATHETERIZATION  02/2008   CATARACT EXTRACTION Right    CATARACT EXTRACTION W/PHACO Right 08/31/2019   Procedure: CATARACT EXTRACTION PHACO AND INTRAOCULAR LENS PLACEMENT (IOC) RIGHT;  Surgeon: Myrna Adine Anes, MD;  Location: [facility];  Service: Ophthalmology;  Laterality: Right;  4.41 0:30.7   CATARACT EXTRACTION W/PHACO Left 09/26/2019   Procedure: CATARACT EXTRACTION PHACO AND INTRAOCULAR LENS PLACEMENT (IOC) LEFT 1.60 00:20.3;  Surgeon: Myrna Adine Anes, MD;  Location: [facility];  Service: Ophthalmology;  Laterality: Left;   COLONOSCOPY WITH PROPOFOL  N/A 12/29/2014   Procedure: COLONOSCOPY WITH PROPOFOL ;  Surgeon: Rogelia Copping, MD;  Location: [facility];  Service: Endoscopy;  Laterality: N/A;   COLONOSCOPY WITH PROPOFOL  N/A 12/31/2017   Procedure: COLONOSCOPY WITH PROPOFOL ;  Surgeon: Copping Rogelia, MD;  Location: [facility];  Service: Endoscopy;  Laterality: N/A;  sleep apnea   COLONOSCOPY WITH PROPOFOL  N/A 12/31/2020   Procedure: COLONOSCOPY WITH PROPOFOL ;  Surgeon: Copping Rogelia, MD;  Location: [facility];  Service: Endoscopy;  Laterality: N/A;   CORONARY ANGIOPLASTY  2005   LAD   EYE SURGERY Right    right eye torn retina  surgery   HYSTEROSCOPY  11/2001   resection of benign leiomyoma   KNEE ARTHROSCOPY Right 08/16/2014   Procedure: ARTHROSCOPY KNEE, partial lateral menisectomy and chondroplasty;  Surgeon: Lynwood SHAUNNA Hue, MD;  Location: [facility];  Service: Orthopedics;  Laterality: Right;   POLYPECTOMY  12/29/2014   Procedure: POLYPECTOMY;  Surgeon: Rogelia Copping, MD;  Location: [facility];  Service: Endoscopy;;   REVERSE SHOULDER ARTHROPLASTY Right 01/22/2023   Procedure: REVERSE SHOULDER ARTHROPLASTY;  Surgeon: Melita Drivers, MD;   Location: [facility];  Service: Orthopedics;  Laterality: Right;  120 min   SKIN CANCER EXCISION Right 02/2018   Right leg - Squamous    THYROID  CYST EXCISION Left 10/2002   left lobe thyroid  excision   THYROIDECTOMY, PARTIAL Left    TUBAL LIGATION  1980   BTL   UMBILICAL HERNIA REPAIR  1980   vulvar cancer  1993   laser     Current Outpatient Medications  Medication Sig Dispense Refill   aspirin 81 MG tablet Take 81 mg by mouth daily.     Cholecalciferol (VITAMIN D3) 50 MCG (2000 UT) CAPS Take 2,000 Units by mouth daily.     clindamycin  (CLEOCIN ) 300 MG capsule take 2 caps 1 hour prior to dental cleaning     clopidogrel  (PLAVIX ) 75 MG tablet Take 1 tablet (75 mg total) by mouth daily with breakfast. 90 tablet 0   ezetimibe  (ZETIA ) 10 MG tablet TAKE 1 TABLET BY MOUTH DAILY 90 tablet 3   gabapentin (NEURONTIN) 100 MG capsule Take 100 mg by mouth 2 (two) times daily.     levothyroxine  (SYNTHROID ) 75 MCG tablet TAKE 1 TABLET BY MOUTH DAILY BEFORE BREAKFAST 90 tablet 1   losartan  (COZAAR ) 100 MG tablet TAKE 1 TABLET BY MOUTH DAILY 90 tablet 3   Multiple Vitamins-Minerals (MULTIVITAMIN PO) Take 1 tablet by mouth daily.     Propylene Glycol, PF, (SYSTANE COMPLETE PF) 0.6 % SOLN Place 1 drop into both eyes as needed (dry eyes).     traMADol  (ULTRAM ) 50 MG tablet Take 50 mg by mouth every 6 (six) hours as needed.     metoprolol  tartrate (LOPRESSOR ) 25 MG tablet Take 0.5 tablets (12.5 mg total) by mouth 2 (two) times daily. 180 tablet 3   rosuvastatin  (CRESTOR ) 20 MG tablet Take 1 tablet (20 mg total) by mouth daily. 90 tablet 3   No current facility-administered medications for this visit.    Allergies:   Atorvastatin, Codeine, Ace inhibitors, Enalapril, Penicillins, Sulfa antibiotics, and Adhesive [tape]    Social History:  The patient  reports that she has never smoked. She has never used smokeless tobacco. She reports current alcohol use of about 2.0 standard drinks of alcohol per week.  She reports that she does not use drugs.   Family History:  The patient's family history includes Cervical cancer in her sister; Diabetes in her father; Heart disease in her father; Hypertension in her father and mother; Stroke in her sister.    ROS:  Please see the history of present illness.   Otherwise, review of systems are positive for none.   All other systems are reviewed and negative.    PHYSICAL EXAM: VS:  BP 122/69 (BP Location: Left Arm, Patient Position: Sitting, Cuff Size: Large)   Pulse (!) 54   Ht 5' 2 (1.575 m)   Wt 217 lb 4 oz (98.5 kg)   LMP 01/28/2007 (Approximate)   SpO2 97%   BMI 39.74 kg/m  , BMI Body mass  index is 39.74 kg/m. GEN: Well nourished, well developed, in no acute distress  HEENT: normal  Neck: no JVD, carotid bruits, or masses Cardiac: RRR; no  rubs, or gallops,no edema .  1/6 systolic ejection murmur in the aortic area Respiratory:  clear to auscultation bilaterally, normal work of breathing GI: soft, nontender, nondistended, + BS MS: no deformity or atrophy  Skin: warm and dry, no rash Neuro:  Strength and sensation are intact Psych: euthymic mood, full affect   EKG:  EKG is ordered today. The ekg ordered today demonstrates: Sinus bradycardia When compared with ECG of 04-Nov-2022 14:40, No significant change was found      Recent Labs: 01/01/2023: ALT 18; BUN 12; Creatinine, Ser 0.67; Hemoglobin 13.9; Platelets 233; Potassium 4.0; Sodium 145; TSH 1.600    Lipid Panel    Component Value Date/Time   CHOL 162 01/01/2023 0825   TRIG 117 01/01/2023 0825   HDL 65 01/01/2023 0825   CHOLHDL 2.5 01/01/2023 0825   CHOLHDL 2.8 05/21/2017 0957   VLDL 22 05/21/2017 0957   LDLCALC 76 01/01/2023 0825      Wt Readings from Last 3 Encounters:  11/05/23 217 lb 4 oz (98.5 kg)  07/13/23 218 lb (98.9 kg)  01/22/23 215 lb (97.5 kg)           No data to display            ASSESSMENT AND PLAN:  1.  Coronary artery disease involving  native coronary arteries without angina: Given that she had first generation drug-eluting stent to the LAD, I favor continued lifelong dual antiplatelet therapy as tolerated.    2.  Hyperlipidemia with history intolerance to statins.  She reports no issues with rosuvastatin  10 mg daily but her most recent LDL was 76.  I elected to increase the dose to 20 mg once daily.  Will consider adding ezetimibe  if needed.  3.  Essential hypertension: Blood pressure is well controlled on current medications.  However, she is mildly bradycardic and complains of dizziness.  Thus, I will decrease metoprolol  to 12.5 mg twice daily.   Disposition:   FU with me in 12 months  Signed,  Deatrice Cage, MD  11/05/2023 9:17 AM    [facility]

## 2023-11-16 ENCOUNTER — Telehealth: Payer: Self-pay | Admitting: Cardiovascular Disease

## 2023-11-16 NOTE — Telephone Encounter (Signed)
 Spoke with the patient, who reported that despite decreasing Metoprolol  to 12.5 mg BID, she continues to experience dizziness. She noted that symptoms typically occur when getting out of bed or standing up. Most recent blood pressure readings are listed below.  The patient was advised to go to the ED if she develops symptoms such as feeling like she is going to black out or pass out.  The patient also reported that since increasing Crestor  to 20 mg daily, she has developed muscle aches and pain and does not want to resume the increased dose. She stated the symptoms were so pronounced that she could barely walk.   135/72 63 138/81 55 142/70 67  Will forward to MD for recommendations

## 2023-11-16 NOTE — Telephone Encounter (Signed)
  Pt c/o medication issue:  1. Name of Medication: metoprolol  tartrate (LOPRESSOR ) 25 MG tablet   rosuvastatin  (CRESTOR ) 20 MG tablet   2. How are you currently taking this medication (dosage and times per day)?   Take 0.5 tablets (12.5 mg total) by mouth 2 (two) times daily.    Take 1 tablet (20 mg total) by mouth daily.    3. Are you having a reaction (difficulty breathing--STAT)? No   4. What is your medication issue? The patient said that during her last visit with Dr. Darron, he changed her medications for both metoprolol  and Crestor . She reported that after starting the new dosage of metoprolol , she continues to feel dizzy--mostly when standing up or getting out of bed.  She also mentioned that she has not picked up the new prescription for Crestor  20 mg, as she still has 10 mg tablets at home. Instead, she has been taking two 10 mg tablets to make up the 20 mg dose. Since doing so, she has been experiencing leg and joint pain.  The patient would like to cancel the new Crestor  prescription, as she does not want to continue the 20 mg dose, and will wait for further recommendations from Dr. Darron.

## 2023-11-16 NOTE — Telephone Encounter (Signed)
 Stop metoprolol  and decrease Crestor  to 10 mg daily.

## 2023-11-17 MED ORDER — ROSUVASTATIN CALCIUM 10 MG PO TABS: 10.0000 mg | ORAL_TABLET | Freq: Every day | ORAL | 3 refills | Status: AC

## 2023-11-17 NOTE — Telephone Encounter (Signed)
 Patient has been made aware to stop the Metoprolol  and decrease the Crestor  back to 10 mg once daily.

## 2023-11-19 DIAGNOSIS — H029 Unspecified disorder of eyelid: Secondary | ICD-10-CM | POA: Diagnosis not present

## 2023-11-19 DIAGNOSIS — H02825 Cysts of left lower eyelid: Secondary | ICD-10-CM | POA: Diagnosis not present

## 2023-11-21 ENCOUNTER — Other Ambulatory Visit: Payer: Self-pay | Admitting: Cardiovascular Disease

## 2023-12-22 DIAGNOSIS — H109 Unspecified conjunctivitis: Secondary | ICD-10-CM | POA: Diagnosis not present

## 2023-12-22 DIAGNOSIS — M65311 Trigger thumb, right thumb: Secondary | ICD-10-CM | POA: Diagnosis not present

## 2023-12-22 DIAGNOSIS — M1812 Unilateral primary osteoarthritis of first carpometacarpal joint, left hand: Secondary | ICD-10-CM | POA: Diagnosis not present

## 2023-12-28 DIAGNOSIS — H109 Unspecified conjunctivitis: Secondary | ICD-10-CM | POA: Diagnosis not present

## 2023-12-28 DIAGNOSIS — H04552 Acquired stenosis of left nasolacrimal duct: Secondary | ICD-10-CM | POA: Diagnosis not present

## 2024-01-03 ENCOUNTER — Other Ambulatory Visit: Payer: Self-pay | Admitting: Cardiovascular Disease

## 2024-01-14 ENCOUNTER — Ambulatory Visit: Admitting: Family Medicine

## 2024-01-14 ENCOUNTER — Encounter: Payer: Self-pay | Admitting: Family Medicine

## 2024-01-14 VITALS — BP 129/77 | HR 71 | Ht 62.0 in | Wt 215.0 lb

## 2024-01-14 DIAGNOSIS — E7800 Pure hypercholesterolemia, unspecified: Secondary | ICD-10-CM

## 2024-01-14 DIAGNOSIS — F411 Generalized anxiety disorder: Secondary | ICD-10-CM | POA: Diagnosis not present

## 2024-01-14 DIAGNOSIS — I10 Essential (primary) hypertension: Secondary | ICD-10-CM | POA: Diagnosis not present

## 2024-01-14 DIAGNOSIS — Z0000 Encounter for general adult medical examination without abnormal findings: Secondary | ICD-10-CM | POA: Diagnosis not present

## 2024-01-14 DIAGNOSIS — E559 Vitamin D deficiency, unspecified: Secondary | ICD-10-CM

## 2024-01-14 DIAGNOSIS — Z7902 Long term (current) use of antithrombotics/antiplatelets: Secondary | ICD-10-CM | POA: Diagnosis not present

## 2024-01-14 DIAGNOSIS — Z1231 Encounter for screening mammogram for malignant neoplasm of breast: Secondary | ICD-10-CM

## 2024-01-14 DIAGNOSIS — E039 Hypothyroidism, unspecified: Secondary | ICD-10-CM | POA: Diagnosis not present

## 2024-01-14 NOTE — Assessment & Plan Note (Signed)
 Managed with levothyroxine  75 mcg daily. Thyroid  function to be monitored with labs. - Continue levothyroxine  75 mcg daily - Ordered thyroid  function tests

## 2024-01-14 NOTE — Assessment & Plan Note (Signed)
 Managed with Zetia  10 mg daily and Crestor  10 mg daily. Cholesterol levels to be monitored with labs. - Continue Zetia  10 mg daily - Continue Crestor  10 mg daily - Ordered cholesterol panel

## 2024-01-14 NOTE — Progress Notes (Signed)
 Annual Wellness Visit   Patient: Olivia Dougherty, Female    DOB: 01/21/1951, 73 y.o.   MRN: [id number]  Chief Complaint  Patient presents with   Annual Exam    Subjective:    Olivia Dougherty is a 73 y.o. female who presents today for her Annual Wellness Visit. HPI  Discussed the use of AI scribe software for clinical note transcription with the patient, who gave verbal consent to proceed.  History of Present Illness   Olivia Dougherty is a 73 year old female who presents for an annual wellness visit.  She developed mild nasal congestion and occasional headaches starting the day after a flu shot. Postnasal drip has improved. She has no fever and is limiting close contact with her great-granddaughter due to these nasal symptoms.  She had a shoulder replacement on January 22, 2023, followed by physical therapy. Recovery limited her activity and, along with her husband's health issues, has reduced her regular exercise, though she tries to walk about an hour at [facility] three times weekly.  She takes aspirin, Plavix , Zetia  10 mg daily, Synthroid  75 mcg daily, losartan  100 mg daily, and Crestor  10 mg daily. She stopped metoprolol  because it caused dizziness, which has resolved, and she feels she has more energy. She checks her blood pressure at home and reports it is well controlled.  She has arthritis with intermittent severe pain. She was prescribed tramadol  and a muscle relaxer but avoids them due to side-effect concerns and manages with Tylenol  only.           Medications: Show/hide medication list[1]  Allergies[2]  Patient Care Team: Myrla Jon HERO, MD as PCP - General (Family Medicine) Darron Deatrice LABOR, MD as PCP - Cardiology (Cardiology) Jinny Carmine, MD as Consulting Physician (Gastroenterology) Isenstein, Arin L, MD (Dermatology) Myrna Adine Anes, MD as Consulting Physician (Ophthalmology) Jarold Mayo, MD as Consulting Physician  (Ophthalmology) Blair Mt, MD as Referring Physician (Otolaryngology) Orlando Anes, MD (Anesthesiology)  ROS   Objective:  Objective  BP 129/77 Comment: home reading  Pulse 71   Ht 5' 2 (1.575 m)   Wt 215 lb (97.5 kg)   LMP 01/28/2007   SpO2 100%   BMI 39.32 kg/m    Physical Exam Vitals reviewed.  Constitutional:      General: She is not in acute distress.    Appearance: Normal appearance. She is well-developed. She is not diaphoretic.  HENT:     Head: Normocephalic and atraumatic.     Right Ear: Tympanic membrane, ear canal and external ear normal.     Left Ear: Tympanic membrane, ear canal and external ear normal.     Nose: Nose normal.     Mouth/Throat:     Mouth: Mucous membranes are moist.     Pharynx: Oropharynx is clear. No oropharyngeal exudate.  Eyes:     General: No scleral icterus.    Conjunctiva/sclera: Conjunctivae normal.     Pupils: Pupils are equal, round, and reactive to light.  Neck:     Thyroid : No thyromegaly.  Cardiovascular:     Rate and Rhythm: Normal rate and regular rhythm.     Heart sounds: Normal heart sounds. No murmur heard. Pulmonary:     Effort: Pulmonary effort is normal. No respiratory distress.     Breath sounds: Normal breath sounds. No wheezing or rales.  Abdominal:     General: There is no distension.     Palpations: Abdomen is soft.     Tenderness:  There is no abdominal tenderness.  Musculoskeletal:     Cervical back: Neck supple.     Right lower leg: No edema.     Left lower leg: No edema.  Lymphadenopathy:     Cervical: No cervical adenopathy.  Skin:    General: Skin is warm and dry.  Neurological:     Mental Status: She is alert and oriented to person, place, and time. Mental status is at baseline.     Gait: Gait normal.  Psychiatric:        Mood and Affect: Mood normal.        Behavior: Behavior normal.        Thought Content: Thought content normal.        Most recent depression screenings:     07/13/2023    2:30 PM 12/29/2022    9:21 AM  PHQ 2/9 Scores  PHQ - 2 Score 0 0    Visit info / Clinical Intake: Persons participating in visit and providing information:: patient Medicare Wellness Visit Mode:: In-person (required for WTM) Interpreter Needed?: No Pre-visit prep was completed: no AWV questionnaire completed by patient prior to visit?: yes Date:: 01/12/24 Living arrangements:: (!) lives alone Patient's Overall Health Status Rating: (Patient-Rptd) good Typical amount of pain: (Patient-Rptd) some Does pain affect daily life?: (!) (Patient-Rptd) yes  Dietary Habits and Nutritional Risks How many meals a day?: (Patient-Rptd) 3 Eats fruit and vegetables daily?: (Patient-Rptd) yes Most meals are obtained by: (Patient-Rptd) preparing own meals; eating out  Functional Status Activities of Daily Living (to include ambulation/medication): (Patient-Rptd) Independent Ambulation: (Patient-Rptd) Independent Medication Administration: (Patient-Rptd) Independent Home Management (perform basic housework or laundry): (Patient-Rptd) Independent Manage your own finances?: (Patient-Rptd) yes Primary transportation is: (Patient-Rptd) driving  Fall Screening Falls in the past year?: (Patient-Rptd) 0 Number of falls in past year: 0 Was there an injury with Fall?: 0 Fall Risk Category Calculator: 0 Patient Fall Risk Level: Low Fall Risk  Fall Risk Patient at Risk for Falls Due to: No Fall Risks  Home and Transportation Safety: All rugs have non-skid backing?: (Patient-Rptd) yes All stairs or steps have railings?: (Patient-Rptd) yes Grab bars in the bathtub or shower?: (Patient-Rptd) yes Have non-skid surface in bathtub or shower?: (Patient-Rptd) yes Good home lighting?: (Patient-Rptd) yes Regular seat belt use?: (Patient-Rptd) yes Hospital stays in the last year:: (Patient-Rptd) no  Cognitive Assessment Difficulty concentrating, remembering, or making decisions? :  (Patient-Rptd) no  Advance Directives (For Healthcare) Does Patient Have a Medical Advance Directive?: Yes Does patient want to make changes to medical advance directive?: No - Patient declined Type of Advance Directive: Healthcare Power of [place]; Living will Copy of Healthcare Power of Attorney in Chart?: No - copy requested   SDOH Screenings   Food Insecurity: No Food Insecurity (01/12/2024)  Housing: Low Risk (01/12/2024)  Transportation Needs: No Transportation Needs (01/12/2024)  Utilities: Not At Risk (01/14/2024)  Alcohol Screen: Low Risk (07/11/2023)  Depression (PHQ2-9): Low Risk (07/13/2023)  Financial Resource Strain: Low Risk (01/12/2024)  Physical Activity: Insufficiently Active (01/12/2024)  Social Connections: Socially Integrated (01/12/2024)  Stress: No Stress Concern Present (01/12/2024)  Tobacco Use: Low Risk (01/14/2024)    Vision/Hearing Screen: No results found.   No results found for any visits on 01/14/24.   Assessment & Plan:    Annual wellness visit done today including the all of the following: Reviewed patient's Family and Medical History Reviewed and updated list of patient's medical providers Assessment of cognitive impairment was done Assessed patient's functional ability  Established a written schedule for health screening services Health Risk Assessment Completed and Reviewed  Exercise Activities and Dietary recommendations  Goals       DIET - DECREASE SODA OR JUICE INTAKE      Recommend to continue cutting back on soda in take to 1 or less a day.       DIET - EAT MORE FRUITS AND VEGETABLES      Exercise 3x per week (30 min per time)      Recommend to exercise for 3 days a week for at least 30 minutes at a time.       Weight (lb) < 200 lb (90.7 kg) (pt-stated)      Lose 50 lbs in a year        Immunization History  Administered Date(s) Administered   Fluad  Quad(high Dose 65+) 10/06/2018, 11/02/2019, 11/21/2020   INFLUENZA,  HIGH DOSE SEASONAL PF 10/21/2017   Influenza-Unspecified 12/27/2014, 10/28/2015, 12/10/2021, 11/11/2022, 01/10/2024   PFIZER Comirnaty(Gray Top)Covid-19 Tri-Sucrose Vaccine 05/02/2020   PFIZER(Purple Top)SARS-COV-2 Vaccination 03/31/2019, 04/21/2019, 11/03/2019   Pfizer Covid-19 Vaccine Bivalent Booster 70yrs & up 10/30/2020   Pneumococcal Conjugate-13 08/10/2017   Pneumococcal Polysaccharide-23 12/10/2018   Tdap 10/28/2010, 03/01/2021   Zoster Recombinant(Shingrix) 08/19/2017, 12/09/2017    Health Maintenance  Topic Date Due   COVID-19 Vaccine (6 - 2025-26 season) 09/28/2023   Mammogram  03/10/2024   Medicare Annual Wellness (AWV)  01/13/2025   Colonoscopy  01/01/2028   DTaP/Tdap/Td (3 - Td or Tdap) 03/02/2031   Pneumococcal Vaccine: 50+ Years  Completed   Influenza Vaccine  Completed   Bone Density Scan  Completed   Hepatitis C Screening  Completed   Zoster Vaccines- Shingrix  Completed   Meningococcal B Vaccine  Aged Out    Discussed health benefits of physical activity, and encouraged her to engage in regular exercise appropriate for her age and condition.    Problem List Items Addressed This Visit       Cardiovascular and Mediastinum   Essential hypertension   Blood pressure well-controlled at home with readings around 120/80 mmHg. Discontinued metoprolol  due to dizziness, with improvement in symptoms and energy levels. Current medications include losartan  100 mg daily. - Continue losartan  100 mg daily - Monitor blood pressure at home      Relevant Orders   Comprehensive metabolic panel with GFR   CBC w/Diff/Platelet     Endocrine   Acquired hypothyroidism   Managed with levothyroxine  75 mcg daily. Thyroid  function to be monitored with labs. - Continue levothyroxine  75 mcg daily - Ordered thyroid  function tests      Relevant Orders   TSH     Other   Hyperlipidemia   Managed with Zetia  10 mg daily and Crestor  10 mg daily. Cholesterol levels to be monitored  with labs. - Continue Zetia  10 mg daily - Continue Crestor  10 mg daily - Ordered cholesterol panel      Relevant Orders   Comprehensive metabolic panel with GFR   Lipid panel   GAD (generalized anxiety disorder)   Discussed stress management techniques, including deep breathing exercises and exposure to cold temperatures to regulate nervous system response. Encouraged non-pharmacological interventions for stress relief. - Encouraged deep breathing exercises and exposure to cold temperatures for stress relief - Encouraged non-pharmacological interventions for stress management       Morbid obesity (HCC)   Discussed challenges with weight management and potential future options for weight loss medications. - Encouraged regular physical activity, such as walking  three times a week      Relevant Orders   CBC w/Diff/Platelet   Vitamin D  deficiency   Vitamin D  levels to be monitored with labs. - Ordered vitamin D  level      Relevant Orders   VITAMIN D  25 Hydroxy (Vit-D Deficiency, Fractures)   Other Visit Diagnoses       Encounter for annual wellness visit (AWV) in Medicare patient    -  Primary     Breast cancer screening by mammogram       Relevant Orders   MM 3D SCREENING MAMMOGRAM BILATERAL BREAST     Long term (current) use of antithrombotics/antiplatelets               Adult Wellness Visit Routine wellness visit with discussion on screenings and vaccinations. Mammogram due in February. Colonoscopy not needed until 2029. Vaccinations up to date except for RSV vaccine, which is recommended due to potential severe outcomes from RSV infection. - Ordered mammogram for February - Recommended RSV vaccine, available at pharmacy, to be administered after two weeks from flu shot      Return in about 6 months (around 07/14/2024) for chronic disease f/u.     Jon Eva, MD [facility]        [1]  Outpatient Medications Prior to Visit   Medication Sig   aspirin 81 MG tablet Take 81 mg by mouth daily.   Cholecalciferol (VITAMIN D3) 50 MCG (2000 UT) CAPS Take 2,000 Units by mouth daily.   clindamycin  (CLEOCIN ) 300 MG capsule take 2 caps 1 hour prior to dental cleaning   clopidogrel  (PLAVIX ) 75 MG tablet TAKE 1 TABLET BY MOUTH DAILY WITH BREAKFAST   ezetimibe  (ZETIA ) 10 MG tablet TAKE 1 TABLET BY MOUTH DAILY   gabapentin (NEURONTIN) 100 MG capsule Take 100 mg by mouth 2 (two) times daily.   levothyroxine  (SYNTHROID ) 75 MCG tablet TAKE 1 TABLET BY MOUTH DAILY BEFORE BREAKFAST   losartan  (COZAAR ) 100 MG tablet TAKE 1 TABLET BY MOUTH DAILY   Multiple Vitamins-Minerals (MULTIVITAMIN PO) Take 1 tablet by mouth daily.   Propylene Glycol, PF, (SYSTANE COMPLETE PF) 0.6 % SOLN Place 1 drop into both eyes as needed (dry eyes).   rosuvastatin  (CRESTOR ) 10 MG tablet Take 1 tablet (10 mg total) by mouth daily.   [DISCONTINUED] traMADol  (ULTRAM ) 50 MG tablet Take 50 mg by mouth every 6 (six) hours as needed.   No facility-administered medications prior to visit.  [2]  Allergies Allergen Reactions   Atorvastatin Other (See Comments)    Other reaction(s): myalgia   Codeine Shortness Of Breath   Ace Inhibitors Cough        Enalapril Cough   Penicillins Hives        Sulfa Antibiotics Hives and Swelling        Adhesive [Tape] Rash

## 2024-01-14 NOTE — Assessment & Plan Note (Signed)
 Blood pressure well-controlled at home with readings around 120/80 mmHg. Discontinued metoprolol  due to dizziness, with improvement in symptoms and energy levels. Current medications include losartan  100 mg daily. - Continue losartan  100 mg daily - Monitor blood pressure at home

## 2024-01-14 NOTE — Assessment & Plan Note (Signed)
 Discussed challenges with weight management and potential future options for weight loss medications. - Encouraged regular physical activity, such as walking three times a week

## 2024-01-14 NOTE — Assessment & Plan Note (Signed)
 Vitamin D  levels to be monitored with labs. - Ordered vitamin D  level

## 2024-01-14 NOTE — Patient Instructions (Signed)
 Please contact [phone] to schedule your mammogram. You will be asked your location preference to have procedure performed. You have two options listed below.  Upon results being received our office will contact you. As well as all results can be viewed through your MyChart. Please feel free to contact us  if you have any further questions or concerns.

## 2024-01-14 NOTE — Assessment & Plan Note (Addendum)
 Discussed stress management techniques, including deep breathing exercises and exposure to cold temperatures to regulate nervous system response. Encouraged non-pharmacological interventions for stress relief. - Encouraged deep breathing exercises and exposure to cold temperatures for stress relief - Encouraged non-pharmacological interventions for stress management

## 2024-01-15 LAB — CBC WITH DIFFERENTIAL/PLATELET
Basophils Absolute: 0 x10E3/uL (ref 0.0–0.2)
Basos: 1 %
EOS (ABSOLUTE): 0.2 x10E3/uL (ref 0.0–0.4)
Eos: 4 %
Hematocrit: 42.4 % (ref 34.0–46.6)
Hemoglobin: 14.2 g/dL (ref 11.1–15.9)
Immature Grans (Abs): 0 x10E3/uL (ref 0.0–0.1)
Immature Granulocytes: 0 %
Lymphocytes Absolute: 1.4 x10E3/uL (ref 0.7–3.1)
Lymphs: 26 %
MCH: 32.1 pg (ref 26.6–33.0)
MCHC: 33.5 g/dL (ref 31.5–35.7)
MCV: 96 fL (ref 79–97)
Monocytes Absolute: 0.5 x10E3/uL (ref 0.1–0.9)
Monocytes: 9 %
Neutrophils Absolute: 3.3 x10E3/uL (ref 1.4–7.0)
Neutrophils: 60 %
Platelets: 219 x10E3/uL (ref 150–450)
RBC: 4.42 x10E6/uL (ref 3.77–5.28)
RDW: 11.9 % (ref 11.7–15.4)
WBC: 5.4 x10E3/uL (ref 3.4–10.8)

## 2024-01-15 LAB — COMPREHENSIVE METABOLIC PANEL WITH GFR
ALT: 14 IU/L (ref 0–32)
AST: 20 IU/L (ref 0–40)
Albumin: 4.5 g/dL (ref 3.8–4.8)
Alkaline Phosphatase: 100 IU/L (ref 49–135)
BUN/Creatinine Ratio: 13 (ref 12–28)
BUN: 9 mg/dL (ref 8–27)
Bilirubin Total: 0.6 mg/dL (ref 0.0–1.2)
CO2: 24 mmol/L (ref 20–29)
Calcium: 9.5 mg/dL (ref 8.7–10.3)
Chloride: 105 mmol/L (ref 96–106)
Creatinine, Ser: 0.69 mg/dL (ref 0.57–1.00)
Globulin, Total: 2.2 g/dL (ref 1.5–4.5)
Glucose: 91 mg/dL (ref 70–99)
Potassium: 4.2 mmol/L (ref 3.5–5.2)
Sodium: 143 mmol/L (ref 134–144)
Total Protein: 6.7 g/dL (ref 6.0–8.5)
eGFR: 93 mL/min/1.73

## 2024-01-15 LAB — LIPID PANEL
Chol/HDL Ratio: 2.1 ratio (ref 0.0–4.4)
Cholesterol, Total: 156 mg/dL (ref 100–199)
HDL: 73 mg/dL
LDL Chol Calc (NIH): 63 mg/dL (ref 0–99)
Triglycerides: 115 mg/dL (ref 0–149)
VLDL Cholesterol Cal: 20 mg/dL (ref 5–40)

## 2024-01-15 LAB — TSH: TSH: 1.57 u[IU]/mL (ref 0.450–4.500)

## 2024-01-15 LAB — VITAMIN D 25 HYDROXY (VIT D DEFICIENCY, FRACTURES): Vit D, 25-Hydroxy: 44.4 ng/mL (ref 30.0–100.0)

## 2024-01-25 ENCOUNTER — Ambulatory Visit: Payer: Self-pay | Admitting: Family Medicine

## 2024-03-02 ENCOUNTER — Other Ambulatory Visit: Payer: Self-pay | Admitting: Cardiovascular Disease

## 2024-03-11 ENCOUNTER — Ambulatory Visit (HOSPITAL_BASED_OUTPATIENT_CLINIC_OR_DEPARTMENT_OTHER): Admitting: Radiology

## 2024-07-18 ENCOUNTER — Ambulatory Visit: Admitting: Family Medicine
